# Patient Record
Sex: Male | Born: 1970 | Race: White | Hispanic: No | Marital: Married | Smoking: Former smoker
Health system: Southern US, Community
[De-identification: ages and names within clinical notes are randomized; demographics above are authoritative.]

## PROBLEM LIST (undated history)

## (undated) DIAGNOSIS — I471 Supraventricular tachycardia, unspecified: Secondary | ICD-10-CM

## (undated) DIAGNOSIS — Z8619 Personal history of other infectious and parasitic diseases: Secondary | ICD-10-CM

## (undated) DIAGNOSIS — R55 Syncope and collapse: Secondary | ICD-10-CM

## (undated) HISTORY — DX: Supraventricular tachycardia, unspecified: I47.10

## (undated) HISTORY — DX: Supraventricular tachycardia: I47.1

## (undated) HISTORY — PX: KNEE SURGERY: SHX244

## (undated) HISTORY — PX: FRACTURE SURGERY: SHX138

## (undated) HISTORY — DX: Personal history of other infectious and parasitic diseases: Z86.19

## (undated) HISTORY — PX: TONSILLECTOMY AND ADENOIDECTOMY: SHX28

## (undated) HISTORY — DX: Syncope and collapse: R55

---

## 2004-06-14 HISTORY — PX: OTHER SURGICAL HISTORY: SHX169

## 2007-04-22 DIAGNOSIS — M5020 Other cervical disc displacement, unspecified cervical region: Secondary | ICD-10-CM | POA: Insufficient documentation

## 2007-11-30 DIAGNOSIS — F39 Unspecified mood [affective] disorder: Secondary | ICD-10-CM | POA: Insufficient documentation

## 2009-08-29 ENCOUNTER — Emergency Department: Payer: Self-pay | Admitting: Emergency Medicine

## 2010-01-26 LAB — HEPATIC FUNCTION PANEL
ALT: 45 U/L — AB (ref 10–40)
AST: 25 U/L (ref 14–40)

## 2010-01-26 LAB — LIPID PANEL
CHOLESTEROL: 267 mg/dL — AB (ref 0–200)
HDL: 32 mg/dL — AB (ref 35–70)
LDL Cholesterol: 167 mg/dL
TRIGLYCERIDES: 342 mg/dL — AB (ref 40–160)

## 2010-01-26 LAB — BASIC METABOLIC PANEL
BUN: 9 mg/dL (ref 4–21)
CREATININE: 0.8 mg/dL (ref 0.6–1.3)
Glucose: 96 mg/dL
Potassium: 4.6 mmol/L (ref 3.4–5.3)
Sodium: 141 mmol/L (ref 137–147)

## 2010-01-26 LAB — TSH: TSH: 1.43 u[IU]/mL (ref 0.41–5.90)

## 2014-07-31 ENCOUNTER — Other Ambulatory Visit: Payer: Self-pay | Admitting: *Deleted

## 2014-07-31 ENCOUNTER — Other Ambulatory Visit: Payer: Self-pay | Admitting: Family Medicine

## 2014-07-31 MED ORDER — HYDROCODONE-ACETAMINOPHEN 5-325 MG PO TABS: 1.0000 | ORAL_TABLET | Freq: Three times a day (TID) | ORAL | Status: DC | PRN

## 2014-07-31 NOTE — Telephone Encounter (Signed)
Refill request for Hydrocodone-Ace 5-325 mg Last filled by MD on- 06/16/2014 #90 x0 Last Appt: 08/05/2013 Next Appt: none Please advise refill?

## 2014-07-31 NOTE — Telephone Encounter (Signed)
Pt contacted office for refill request on the following medications: Hydrocodone 5-325 mg Pt stated he is out of the medication and tried to send a refill request online yesterday. Thanks TNP

## 2014-08-03 NOTE — Telephone Encounter (Addendum)
Left detailed message on pt's vm, letting pt know his rx is ready to pick-up.

## 2014-08-03 NOTE — Telephone Encounter (Signed)
Pt is called to see if Rx is ready to pick up/MJ

## 2014-08-10 ENCOUNTER — Emergency Department: Payer: Self-pay

## 2014-08-10 ENCOUNTER — Emergency Department
Admission: EM | Admit: 2014-08-10 | Discharge: 2014-08-10 | Discharge status: Against Medical Advice | Payer: Self-pay | Attending: Emergency Medicine | Admitting: Emergency Medicine

## 2014-08-10 ENCOUNTER — Other Ambulatory Visit: Payer: Self-pay

## 2014-08-10 DIAGNOSIS — Z720 Tobacco use: Secondary | ICD-10-CM | POA: Insufficient documentation

## 2014-08-10 DIAGNOSIS — R55 Syncope and collapse: Secondary | ICD-10-CM | POA: Insufficient documentation

## 2014-08-10 DIAGNOSIS — R000 Tachycardia, unspecified: Secondary | ICD-10-CM | POA: Insufficient documentation

## 2014-08-10 LAB — CBC
HEMATOCRIT: 46.5 % (ref 40.0–52.0)
Hemoglobin: 15.8 g/dL (ref 13.0–18.0)
MCH: 29.6 pg (ref 26.0–34.0)
MCHC: 34 g/dL (ref 32.0–36.0)
MCV: 87.1 fL (ref 80.0–100.0)
PLATELETS: 234 10*3/uL (ref 150–440)
RBC: 5.34 MIL/uL (ref 4.40–5.90)
RDW: 13.9 % (ref 11.5–14.5)
WBC: 9.3 10*3/uL (ref 3.8–10.6)

## 2014-08-10 LAB — BASIC METABOLIC PANEL
Anion gap: 8 (ref 5–15)
BUN: 10 mg/dL (ref 6–20)
CHLORIDE: 105 mmol/L (ref 101–111)
CO2: 23 mmol/L (ref 22–32)
CREATININE: 0.92 mg/dL (ref 0.61–1.24)
Calcium: 8.9 mg/dL (ref 8.9–10.3)
GFR calc Af Amer: >60 mL/min (ref >60)
GFR calc non Af Amer: >60 mL/min (ref >60)
Glucose, Bld: 131 mg/dL — ABNORMAL HIGH (ref 65–99)
Potassium: 4.1 mmol/L (ref 3.5–5.1)
Sodium: 136 mmol/L (ref 135–145)

## 2014-08-10 LAB — TROPONIN I: Troponin I: <0.03 ng/mL (ref <0.031)

## 2014-08-10 LAB — MAGNESIUM: Magnesium: 2 mg/dL (ref 1.7–2.4)

## 2014-08-10 LAB — TSH: TSH: 1.256 u[IU]/mL (ref 0.350–4.500)

## 2014-08-10 MED ORDER — SODIUM CHLORIDE 0.9 % IV BOLUS (SEPSIS)
1000.0000 mL | Freq: Once | INTRAVENOUS | Status: AC
  Administered 2014-08-10: 1000 mL via INTRAVENOUS

## 2014-08-10 MED ORDER — DILTIAZEM HCL 25 MG/5ML IV SOLN
INTRAVENOUS | Status: AC
  Filled 2014-08-10: qty 5

## 2014-08-10 NOTE — ED Notes (Signed)
Pt arrives with syncopal episode, pt states he passed out in the bathroom this morning, pt denies dizziness, pt states he felt hot and sweaty before event, upon arrival pt HR 120s ST, MD at bedside, code cart at bedside, EKG repeated from triage, wife at bedside, pt awake and alert in no distress, no increased work of breathing

## 2014-08-10 NOTE — Discharge Instructions (Signed)
Syncope  No driving until cleared by a physician. Follow-up with Dr. Velva Harman as soon as possible. You may return to the emergency room at any time for reevaluation and consideration to be admitted as your offered today.  Syncope is a medical term for fainting or passing out. This means you lose consciousness and drop to the ground. People are generally unconscious for less than 5 minutes. You may have some muscle twitches for up to 15 seconds before waking up and returning to normal. Syncope occurs more often in older adults, but it can happen to anyone. While most causes of syncope are not dangerous, syncope can be a sign of a serious medical problem. It is important to seek medical care.  CAUSES  Syncope is caused by a sudden drop in blood flow to the brain. The specific cause is often not determined. Factors that can bring on syncope include:  Taking medicines that lower blood pressure.  Sudden changes in posture, such as standing up quickly.  Taking more medicine than prescribed.  Standing in one place for too long.  Seizure disorders.  Dehydration and excessive exposure to heat.  Low blood sugar (hypoglycemia).  Straining to have a bowel movement.  Heart disease, irregular heartbeat, or other circulatory problems.  Fear, emotional distress, seeing blood, or severe pain. SYMPTOMS  Right before fainting, you may:  Feel dizzy or light-headed.  Feel nauseous.  See all white or all black in your field of vision.  Have cold, clammy skin. DIAGNOSIS  Your health care provider will ask about your symptoms, perform a physical exam, and perform an electrocardiogram (ECG) to record the electrical activity of your heart. Your health care provider may also perform other heart or blood tests to determine the cause of your syncope which may include:  Transthoracic echocardiogram (TTE). During echocardiography, sound waves are used to evaluate how blood flows through your  heart.  Transesophageal echocardiogram (TEE).  Cardiac monitoring. This allows your health care provider to monitor your heart rate and rhythm in real time.  Holter monitor. This is a portable device that records your heartbeat and can help diagnose heart arrhythmias. It allows your health care provider to track your heart activity for several days, if needed.  Stress tests by exercise or by giving medicine that makes the heart beat faster. TREATMENT  In most cases, no treatment is needed. Depending on the cause of your syncope, your health care provider may recommend changing or stopping some of your medicines. HOME CARE INSTRUCTIONS  Have someone stay with you until you feel stable.  Do not drive, use machinery, or play sports until your health care provider says it is okay.  Keep all follow-up appointments as directed by your health care provider.  Lie down right away if you start feeling like you might faint. Breathe deeply and steadily. Wait until all the symptoms have passed.  Drink enough fluids to keep your urine clear or pale yellow.  If you are taking blood pressure or heart medicine, get up slowly and take several minutes to sit and then stand. This can reduce dizziness. SEEK IMMEDIATE MEDICAL CARE IF:   You have a severe headache.  You have unusual pain in the chest, abdomen, or back.  You are bleeding from your mouth or rectum, or you have black or tarry stool.  You have an irregular or very fast heartbeat.  You have pain with breathing.  You have repeated fainting or seizure-like jerking during an episode.  You faint  when sitting or lying down.  You have confusion.  You have trouble walking.  You have severe weakness.  You have vision problems. If you fainted, call your local emergency services (911 in [place] Do not drive yourself to the hospital.  MAKE SURE YOU:  Understand these instructions.  Will watch your condition.  Will get help right away  if you are not doing well or get worse. Document Released: 01/09/2005 Document Revised: 01/14/2013 Document Reviewed: 03/10/2011 [facility] Patient Information 2015 [place], [place]. This information is not intended to replace advice given to you by your health care provider. Make sure you discuss any questions you have with your health care provider.

## 2014-08-10 NOTE — ED Provider Notes (Signed)
[facility] Emergency Department Provider Note  ____________________________________________  Time seen: Approximately 9:14 AM  I have reviewed the triage vital signs and the nursing notes.   HISTORY  Chief Complaint Loss of Consciousness    HPI Devin Parsons is a 44 y.o. male patient states that he was walking from the bathroom when he suddenly felt his heart was racing and passed out. His significant other who is with him states that he passed out for only a couple of seconds and then she was able to arouse him. He was awake and oriented. He denies any chest pain or trouble breathing. He has not had any caffeine today, though he does usually drink a monster drink and coffee every day but none recently. He denies any swelling. No long trips or travels. Denies having any history of any heart problems. Does have a previous history of drug abuse but this was greater than 20 years ago.  Patient states that he felt his heart was racing really fast and is very lightheaded popping him to come to the emergency room. I saw him in the hospital room and approximately 9 AM at which time his heart rate has improved, and he states his symptoms are feeling better.  Again, patient denies any chest pain or trouble breathing. No abdominal pain. No fevers or chills.   History reviewed. No pertinent past medical history.  There are no active problems to display for this patient.   Past Surgical History  Procedure Laterality Date  . Fracture surgery      Current Outpatient Rx  Name  Route  Sig  Dispense  Refill  . ibuprofen (ADVIL,MOTRIN) 200 MG tablet   Oral   Take 200 mg by mouth 2 (two) times daily as needed for mild pain.         . nabumetone (RELAFEN) 500 MG tablet   Oral   Take 500 mg by mouth daily as needed for moderate pain.            Allergies Review of patient's allergies indicates no known allergies.  No family history on file.  Social  History History  Substance Use Topics  . Smoking status: Current Every Day Smoker  . Smokeless tobacco: Not on file  . Alcohol Use: Yes    Review of Systems Constitutional: No fever/chills Eyes: No visual changes. ENT: No sore throat. Cardiovascular: Denies chest pain. Respiratory: Denies shortness of breath. Gastrointestinal: No abdominal pain.  Did feel some nausea just prior to and after passing out.  No diarrhea.  No constipation. Genitourinary: Negative for dysuria. Musculoskeletal: Negative for back pain. Skin: Negative for rash. Neurological: Negative for headaches, focal weakness or numbness.  10-point ROS otherwise negative.  ____________________________________________   PHYSICAL EXAM:  VITAL SIGNS: ED Triage Vitals  Enc Vitals Group     BP 08/10/14 0853 74/53 mmHg     Pulse Rate 08/10/14 0853 196     Resp 08/10/14 0853 18     Temp 08/10/14 0853 98.1 F (36.7 C)     Temp Source 08/10/14 0853 Oral     SpO2 08/10/14 0853 98 %     Weight 08/10/14 0853 160 lb (72.576 kg)     Height 08/10/14 0853 5\' 11"  (1.803 m)     Head Cir --      Peak Flow --      Pain Score --      Pain Loc --      Pain Edu? --  Excl. in [place]? --     Constitutional: Alert and oriented. Well appearing and in no acute distress. Eyes: Conjunctivae are normal. PERRL. EOMI. Head: Atraumatic. Nose: No congestion/rhinnorhea. Mouth/Throat: Mucous membranes are moist.  Oropharynx non-erythematous. Neck: No stridor.   Cardiovascular: 120 heart rate, regular rhythm. Grossly normal heart sounds.  Good peripheral circulation. Respiratory: Normal respiratory effort.  No retractions. Lungs CTAB. Gastrointestinal: Soft and nontender. No distention. No abdominal bruits. No CVA tenderness. Musculoskeletal: No lower extremity tenderness nor edema.  No joint effusions. No edema. No cords. No calf tenderness. Neurologic:  Normal speech and language. No gross focal neurologic deficits are appreciated.  No gait instability. Skin:  Skin is warm, dry and intact. No rash noted. Psychiatric: Mood and affect are normal. Speech and behavior are normal.  ____________________________________________   LABS (all labs ordered are listed, but only abnormal results are displayed)  Labs Reviewed  BASIC METABOLIC PANEL - Abnormal; Notable for the following:    Glucose, Bld 131 (*)    All other components within normal limits  CBC  MAGNESIUM  TROPONIN I  TSH  URINE DRUG SCREEN, QUALITATIVE ([facility] ONLY)   ____________________________________________  EKG  Interpreted by me Time 9 AM Tachycardia to 200. Undetermined rhythm, though suspect atrial fibrillation versus flutter as there is some irregularity. No acute ST elevation, possibly some rate related changes noted throughout suggesting subendocardial injury. QRS 70 QTc 431  Repeat EKG reviewed and interpreted by me at 9:02 AM Heart rate 107 Sinus tachycardia without ischemic abnormality. Intervals normal. Interpreted as sinus tachycardia otherwise normal EKG. ____________________________________________  RADIOLOGY  DG Chest 1 View (Final result) Result time: 08/10/14 27:03:50   Final result by Rad Results In Interface (08/10/14 09:38:18)   Narrative:   CLINICAL DATA: Syncope.  EXAM: CHEST 1 VIEW  COMPARISON: None.  FINDINGS: Mediastinum and hilar structures are normal. Heart size normal. No pleural effusion or pneumothorax. No acute bony abnormality.  IMPRESSION: No acute cardiopulmonary disease    ____________________________________________   PROCEDURES  Procedure(s) performed: None  Critical Care performed: Yes, see critical care note(s)  CRITICAL CARE Performed by: Delman Kitten   Total critical care time: 35  Critical care time was exclusive of separately billable procedures and treating other patients.  Critical care was necessary to treat or prevent imminent or life-threatening  deterioration.  Critical care was time spent personally by me on the following activities: development of treatment plan with patient and/or surrogate as well as nursing, discussions with consultants, evaluation of patient's response to treatment, examination of patient, obtaining history from patient or surrogate, ordering and performing treatments and interventions, ordering and review of laboratory studies, ordering and review of radiographic studies, pulse oximetry and re-evaluation of patient's condition.  Patient presents with unstable vital signs after syncopal episode and hypotension with initial blood pressure less than 80 systolic and heart rate approaching 200 prompting immediate need for evaluation to rule out life-threatening illness including acute cardiac.  ____________________________________________   INITIAL IMPRESSION / ASSESSMENT AND PLAN / ED COURSE  Pertinent labs & imaging results that were available during my care of the patient were reviewed by me and considered in my medical decision making (see chart for details).  Patient presents with extreme tachycardia and hypotension. Initial EKG is concerning for possible atrial fibrillation versus flutter suggesting a malignant arrhythmia that may have caused him to have a syncopal episode. No evidence of injury. Normal neurologic examination, and no indication for CT imaging of the head. I am concerned about  a primary cardiac etiology, he has no risk factor for pulmonary embolism and no chest pain suggest acute cardiac ischemia. We'll send laboratory evaluation, hydrate, and continue to monitor him closely. Anticipate discussion with cardiology.  I am reassured at this time that his heart rate has improved and his blood pressure is improving. ____________________________________________  ----------------------------------------- 11:12 AM on 08/10/2014 -----------------------------------------  No ectopy on telemetry  monitoring. Patient denies any symptoms. I discussed with Dr. Velva Harman EKG, presentation including syncope and he recommends admission for echocardiogram and cardiology consultation. Discussed with the patient, he is currently debating whether or not he would like to be admitted otherwise would have to go AMA as I explained the risk of death due to severe arrhythmia is quite real. The patient voices understanding that he is at risk for potential arrhythmia, cardiac arrest, or death if he were to leave without further observation and cardiology consultation. He and his wife continues to debate whether or not he would like to stay.  ----------------------------------------- 11:52 AM on 08/10/2014 -----------------------------------------  08/10/2014 at 11:53 AM:  The patient requested to leave.  I considered this to be leaving against medical advice. I personally discussed the following with them:  1)  That they currently had a medical condition of passing out and rapid heart rate a In arrhythmia which could put him at risk of cardiac arrest or death. nd another episode which could put him at risk of death or severe disability.I am concerned that they may have   2)  My proposed course of evaluation and treatment includes, but is not limited to, admission and cardiology consult .  Benefits of staying include possible diagnosis or excluding of heart attack or life threatning heart problem or an alternative serious condition such as atrial fibrillation or flutter, which if identified early would lead to appropriate intervention in a timely manner lessening the burden of disability and death.  Risks of leaving before this had been completed include: misdiagnosis, worsening illness leading up to and including prolonged or permanent disability or death.  Specific risks pertinent, but not all inclusive, of their current medical condition include but are not limited to death, cardiac arrest, permanent disability,  stroke.    Despite this he refused further evaluation, treatment, or admission at this time.   They appeared clinically sober, were mentating appropriately, were free from distracting injury, had adequately controlled acute pain, appeared to have intact insight, judgment, and reason, and in my opinion had the capacity to make this decision.  Specifically, they were able to verbally state back in a coherent manner their current medical condition/current diagnosis, the proposed course of evaluation and/or treatment, and the risks, benefits, and alternatives of treatment versus leaving against medical advice.   They understand that they may return to seek medical attention here at ANY time they want.  I strongly advised them to return to the Emergency Department immediately if they experience any new or worsening symptoms that concern them, or simply if they reconsider continued evaluation and/or treatment as previously discussed.  This would be without any repercussions, though they understand they likely will need to wait again in the Emergency Department if other patients are in front of them, rather than being brought straight back.  They understood this is another advantage of staying, but still insisted upon leaving.  I recommended they follow-up with Dr. Fletcher Anon at the earliest available opportunity/appointment for further evaluation and treatment.   The patient was discharged against medical advice.  They did accept  written discharge instructions. Patient will be discharged [place] having strongly encouraged him to stay in discussing the risks with him and his wife.  I discussed with Dr. Sophronia Simas he will follow-up in the clinic as soon as possible.   FINAL CLINICAL IMPRESSION(S) / ED DIAGNOSES  Final diagnoses:  Tachyarrhythmia  Syncope and collapse      Delman Kitten, MD 08/10/14 1159

## 2014-08-10 NOTE — ED Notes (Signed)
Pt states he has not felt well for the past couple of days and was walking back from the BR and passed out, denies chest pain but states he has felt his heart beating fast , states "thats normal for me".Devin Parsons

## 2014-08-10 NOTE — ED Notes (Signed)
AAOx3.  Skin warm and dry.  No SOB/DOE.  Denies complaint of pain.  Patient demonstrates good understanding of signing out AMA.  Reinforced importance to return should symptoms return.  Understanding verbalized.  Ambulates with easy and steady gait.  NAD.

## 2014-08-10 NOTE — ED Notes (Signed)
Pt resting in bed in no distress, wife at bedside, will continue to monitor closely

## 2014-08-11 ENCOUNTER — Telehealth: Payer: Self-pay

## 2014-08-11 NOTE — Telephone Encounter (Signed)
l mom to schedule ED f/u appt for syncope

## 2014-08-13 ENCOUNTER — Encounter: Payer: Self-pay | Admitting: Emergency Medicine

## 2014-08-13 ENCOUNTER — Emergency Department: Payer: Medicaid Other

## 2014-08-13 ENCOUNTER — Observation Stay: Admit: 2014-08-13 | Payer: Medicaid Other

## 2014-08-13 ENCOUNTER — Observation Stay (HOSPITAL_BASED_OUTPATIENT_CLINIC_OR_DEPARTMENT_OTHER)
Admit: 2014-08-13 | Discharge: 2014-08-13 | Disposition: A | Discharge status: Home / Self Care | Payer: Medicaid Other | Attending: Internal Medicine | Admitting: Internal Medicine

## 2014-08-13 ENCOUNTER — Observation Stay
Admission: EM | Admit: 2014-08-13 | Discharge: 2014-08-14 | Disposition: A | Discharge status: Home / Self Care | Payer: Medicaid Other | Attending: Internal Medicine | Admitting: Internal Medicine

## 2014-08-13 DIAGNOSIS — I071 Rheumatic tricuspid insufficiency: Secondary | ICD-10-CM | POA: Diagnosis not present

## 2014-08-13 DIAGNOSIS — R002 Palpitations: Secondary | ICD-10-CM | POA: Insufficient documentation

## 2014-08-13 DIAGNOSIS — Z7982 Long term (current) use of aspirin: Secondary | ICD-10-CM | POA: Insufficient documentation

## 2014-08-13 DIAGNOSIS — M1990 Unspecified osteoarthritis, unspecified site: Secondary | ICD-10-CM | POA: Insufficient documentation

## 2014-08-13 DIAGNOSIS — R55 Syncope and collapse: Secondary | ICD-10-CM

## 2014-08-13 DIAGNOSIS — Z79899 Other long term (current) drug therapy: Secondary | ICD-10-CM | POA: Insufficient documentation

## 2014-08-13 DIAGNOSIS — R5383 Other fatigue: Secondary | ICD-10-CM | POA: Diagnosis present

## 2014-08-13 DIAGNOSIS — I471 Supraventricular tachycardia: Secondary | ICD-10-CM | POA: Diagnosis not present

## 2014-08-13 DIAGNOSIS — R42 Dizziness and giddiness: Secondary | ICD-10-CM | POA: Insufficient documentation

## 2014-08-13 DIAGNOSIS — F17200 Nicotine dependence, unspecified, uncomplicated: Secondary | ICD-10-CM | POA: Diagnosis not present

## 2014-08-13 DIAGNOSIS — E860 Dehydration: Secondary | ICD-10-CM | POA: Diagnosis not present

## 2014-08-13 DIAGNOSIS — Z833 Family history of diabetes mellitus: Secondary | ICD-10-CM | POA: Diagnosis not present

## 2014-08-13 DIAGNOSIS — R0602 Shortness of breath: Secondary | ICD-10-CM | POA: Insufficient documentation

## 2014-08-13 LAB — BASIC METABOLIC PANEL
Anion gap: 8 (ref 5–15)
BUN: 9 mg/dL (ref 6–20)
CALCIUM: 9.1 mg/dL (ref 8.9–10.3)
CHLORIDE: 104 mmol/L (ref 101–111)
CO2: 27 mmol/L (ref 22–32)
Creatinine, Ser: 0.72 mg/dL (ref 0.61–1.24)
GFR calc Af Amer: >60 mL/min (ref >60)
GFR calc non Af Amer: >60 mL/min (ref >60)
Glucose, Bld: 115 mg/dL — ABNORMAL HIGH (ref 65–99)
Potassium: 3.7 mmol/L (ref 3.5–5.1)
SODIUM: 139 mmol/L (ref 135–145)

## 2014-08-13 LAB — CBC
HCT: 42.5 % (ref 40.0–52.0)
Hemoglobin: 14 g/dL (ref 13.0–18.0)
MCH: 28.8 pg (ref 26.0–34.0)
MCHC: 33 g/dL (ref 32.0–36.0)
MCV: 87.1 fL (ref 80.0–100.0)
Platelets: 246 10*3/uL (ref 150–440)
RBC: 4.87 MIL/uL (ref 4.40–5.90)
RDW: 13.8 % (ref 11.5–14.5)
WBC: 8.1 10*3/uL (ref 3.8–10.6)

## 2014-08-13 LAB — MAGNESIUM: MAGNESIUM: 2.1 mg/dL (ref 1.7–2.4)

## 2014-08-13 LAB — TROPONIN I: Troponin I: <0.03 ng/mL (ref <0.031)

## 2014-08-13 MED ORDER — ACETAMINOPHEN 650 MG RE SUPP: 650.0000 mg | Freq: Four times a day (QID) | RECTAL | Status: DC | PRN

## 2014-08-13 MED ORDER — ASPIRIN EC 325 MG PO TBEC
325.0000 mg | DELAYED_RELEASE_TABLET | Freq: Every day | ORAL | Status: DC
  Administered 2014-08-14: 325 mg via ORAL
  Filled 2014-08-13: qty 1

## 2014-08-13 MED ORDER — SENNOSIDES-DOCUSATE SODIUM 8.6-50 MG PO TABS: 1.0000 | ORAL_TABLET | Freq: Every evening | ORAL | Status: DC | PRN

## 2014-08-13 MED ORDER — ENOXAPARIN SODIUM 40 MG/0.4ML ~~LOC~~ SOLN
40.0000 mg | SUBCUTANEOUS | Status: DC
  Administered 2014-08-13: 40 mg via SUBCUTANEOUS
  Filled 2014-08-13: qty 0.4

## 2014-08-13 MED ORDER — ONDANSETRON HCL 4 MG/2ML IJ SOLN: 4.0000 mg | Freq: Four times a day (QID) | INTRAMUSCULAR | Status: DC | PRN

## 2014-08-13 MED ORDER — ONDANSETRON HCL 4 MG PO TABS: 4.0000 mg | ORAL_TABLET | Freq: Four times a day (QID) | ORAL | Status: DC | PRN

## 2014-08-13 MED ORDER — ALUM & MAG HYDROXIDE-SIMETH 200-200-20 MG/5ML PO SUSP: 30.0000 mL | Freq: Four times a day (QID) | ORAL | Status: DC | PRN

## 2014-08-13 MED ORDER — NABUMETONE 500 MG PO TABS
500.0000 mg | ORAL_TABLET | Freq: Every day | ORAL | Status: DC | PRN
  Filled 2014-08-13: qty 1

## 2014-08-13 MED ORDER — FAMOTIDINE 20 MG PO TABS
20.0000 mg | ORAL_TABLET | Freq: Every day | ORAL | Status: DC
  Administered 2014-08-13 – 2014-08-14 (×2): 20 mg via ORAL
  Filled 2014-08-13 (×2): qty 1

## 2014-08-13 MED ORDER — ACETAMINOPHEN 325 MG PO TABS: 650.0000 mg | ORAL_TABLET | Freq: Four times a day (QID) | ORAL | Status: DC | PRN

## 2014-08-13 NOTE — H&P (Signed)
[facility] [place] NAME: Devin Parsons    MR#:  [id number]  DATE OF BIRTH:  1970-05-19  DATE OF ADMISSION:  08/13/2014  PRIMARY CARE PHYSICIAN: Lelon Huh, MD   REQUESTING/REFERRING PHYSICIAN: Dr. Karma Greaser  CHIEF COMPLAINT:  Fatigue and dizziness  HISTORY OF PRESENT ILLNESS:  Devin Parsons  is a 44 y.o. male with a known history of tobacco abuse came into the ED after he had an episode of near syncope today. Patient presented to the ED approximately 3 days ago on July 18 after an episode of syncope and palpitations that appear to be cardiogenic.At the time that he arrived he had severe tachycardia and it seemed irregular. He was seen by Dr. Jacqualine Code and Dr. Jacqualine Code spoke with Dr. Fletcher Anon. Their plan was to admit the patient for an echocardiogram and cardiology evaluation. However the patient felt strongly about not staying in the hospital so he left [place]. Following that for the past 3 days he was feeling very fatigued and tired but did not have any episodes of chest pain or shortness of breath. Intermittent transient episodes of palpitations which were resolving spontaneously. Today patient felt dizzy and almost passed out but did not syncopized completely and came into the ED for further evaluation. He has been using over-the-counter monster energy drink. He was using over-the-counter fat burning pills until 2 weeks ago  PAST MEDICAL HISTORY:  History reviewed. No pertinent past medical history.  PAST SURGICAL HISTOIRY:   Past Surgical History  Procedure Laterality Date  . Fracture surgery      SOCIAL HISTORY:   History  Substance Use Topics  . Smoking status: Current Every Day Smoker  . Smokeless tobacco: Not on file  . Alcohol Use: Yes   drinks 3 cups of coffee per day Denies any illicit drugs  FAMILY HISTORY:  Mother has history of diabetic mellitus  DRUG ALLERGIES:  No Known Allergies  REVIEW OF  SYSTEMS:  CONSTITUTIONAL: No fever, but reporting fatigue and weakness.  EYES: No blurred or double vision.  EARS, NOSE, AND THROAT: No tinnitus or ear pain.  RESPIRATORY: No cough, shortness of breath, wheezing or hemoptysis.  CARDIOVASCULAR: No chest pain, orthopnea, edema.  GASTROINTESTINAL: No nausea, vomiting, diarrhea or abdominal pain.  GENITOURINARY: No dysuria, hematuria.  ENDOCRINE: No polyuria, nocturia,  HEMATOLOGY: No anemia, easy bruising or bleeding SKIN: No rash or lesion. MUSCULOSKELETAL: No joint pain or arthritis.   NEUROLOGIC: No tingling, numbness, weakness.  PSYCHIATRY: No anxiety or depression.   MEDICATIONS AT HOME:   Prior to Admission medications   Medication Sig Start Date End Date Taking? Authorizing Provider  ibuprofen (ADVIL,MOTRIN) 200 MG tablet Take 400-600 mg by mouth 2 (two) times daily as needed for headache or mild pain.    Yes Historical Provider, MD  nabumetone (RELAFEN) 500 MG tablet Take 500 mg by mouth daily as needed for mild pain.   Yes Historical Provider, MD      VITAL SIGNS:  Blood pressure 106/81, pulse 76, temperature 98 F (36.7 C), temperature source Oral, resp. rate 17, height 5\' 11"  (1.803 m), weight 81.647 kg (180 lb), SpO2 98 %.  PHYSICAL EXAMINATION:  GENERAL:  44 y.o.-year-old patient lying in the bed with no acute distress.  EYES: Pupils equal, round, reactive to light and accommodation. No scleral icterus. Extraocular muscles intact.  HEENT: Head atraumatic, normocephalic. Oropharynx and nasopharynx clear.  NECK:  Supple, no jugular venous distention. No thyroid enlargement, no  tenderness.  LUNGS: Normal breath sounds bilaterally, no wheezing, rales,rhonchi or crepitation. No use of accessory muscles of respiration.  CARDIOVASCULAR: S1, S2 normal. No murmurs, rubs, or gallops.  ABDOMEN: Soft, nontender, nondistended. Bowel sounds present. No organomegaly or mass.  EXTREMITIES: No pedal edema, cyanosis, or clubbing.   NEUROLOGIC: Cranial nerves II through XII are intact. Muscle strength 5/5 in all extremities. Sensation intact. Gait not checked.  PSYCHIATRIC: The patient is alert and oriented x 3.  SKIN: No obvious rash, lesion, or ulcer. Several tattoos on the extremities  LABORATORY PANEL:   CBC  Recent Labs Lab 08/13/14 1254  WBC 8.1  HGB 14.0  HCT 42.5  PLT 246   ------------------------------------------------------------------------------------------------------------------  Chemistries   Recent Labs Lab 08/13/14 1254  NA 139  K 3.7  CL 104  CO2 27  GLUCOSE 115*  BUN 9  CREATININE 0.72  CALCIUM 9.1  MG 2.1   ------------------------------------------------------------------------------------------------------------------  Cardiac Enzymes  Recent Labs Lab 08/13/14 1254  TROPONINI <0.03   ------------------------------------------------------------------------------------------------------------------  RADIOLOGY:  Dg Chest 2 View  08/13/2014   CLINICAL DATA:  Fatigue and dizziness. Intermittent shortness of breath  EXAM: CHEST  2 VIEW  COMPARISON:  July 31, 2014  FINDINGS: The lungs are clear. The heart size and pulmonary vascularity are normal. No adenopathy. No bone lesions.  IMPRESSION: No edema or consolidation.   Electronically Signed   By: Lowella Grip III M.D.   On: 08/13/2014 13:35    EKG:   Orders placed or performed during the hospital encounter of 08/13/14  . ED EKG within 10 minutes  . ED EKG within 10 minutes    IMPRESSION AND PLAN:   Devin Parsons  is a 44 y.o. male with a known history of tobacco abuse came into the ED after he had an episode of near syncope today.   1. Near syncope with history of syncope 3 days ago Admit him to telemetry under observation status Monitored on telemetry Cycle cardiac biomarkers Obtain echocardiogram Check orthostatic vital signs Check TSH and fasting lipid panel in a.m. Cardiology consult with dr.Arida   Patient will be benefited with outpatient event monitor for 30-60 days if no diagnosis is made during this hospital course Recommended to stop using over-the-counter fat burning pills and energy drinks  2. Osteoarthritis Continue his home medication and provide pain management as needed  3. Tobacco abuse Counseled patient to quit smoking for 4-5 minutes. We will provide nicotine patch and patient is considering to quit smoking  GI prophylaxis with Pepcid DVT prophylaxis with Lovenox subcutaneous   All the records are reviewed and case discussed with ED provider. Management plans discussed with the patient, family and they are in agreement.  CODE STATUS: Full code,wife is the HCPOA  TOTAL TIME TAKING CARE OF THIS PATIENT, reviewing the medical records, labs, obtaining history and physical, admission orders and coordination of care -45 minutes.    Nicholes Mango M.D on 08/13/2014 at 5:56 PM  Between 7am to 6pm - Pager - [phone]  After 6pm go to [email or web address] - password [email or web address] [facility]  Office  [phone]  CC: Primary care physician; Lelon Huh, MD

## 2014-08-13 NOTE — ED Notes (Signed)
Pt reports feelings of fatigue since Monday. Was seen here then for syncopal episode and dx with irregular heart beat. Pt states his heart rate was over 200 on Monday and low blood pressure. Pt with acute distress noted in triage. Pt states he felt like he was going to pass out at work this am.

## 2014-08-13 NOTE — ED Notes (Signed)
Pt states episode of cold sweats, hot flash and weakness, pt was seen in ER Monday for Palpatations and left AMA, pt awake and alert during assessment in no distress

## 2014-08-13 NOTE — Progress Notes (Signed)
*  PRELIMINARY RESULTS* Echocardiogram 2D Echocardiogram has been performed.  [name] 08/13/2014, 8:11 PM

## 2014-08-13 NOTE — ED Provider Notes (Signed)
[facility] Emergency Department Provider Note  ____________________________________________  Time seen: Approximately 4:43 PM  I have reviewed the triage vital signs and the nursing notes.   HISTORY  Chief Complaint Fatigue    HPI Devin Parsons is a 44 y.o. male with a history of tobacco abuse presents after an episode of near-syncope today.However, he presented 3 days ago after an episode of full syncope and palpitations that appear to be cardiogenic.  At the time that he arrived he had severe tachycardia and it seemed irregular.  He was seen by Dr. Jacqualine Code and Dr. Jacqualine Code spoke with Dr. Fletcher Anon.  Their plan was to admit the patient for an echocardiogram and cardiology evaluation.  However the patient felt strongly about not staying in the hospital so he left [place].  In the intervening 3 days, he has felt extremely exhausted and fatigued but has not had chest pain or shortness of breath.  He occasionally has some transient feeling of palpitation but they do not last.  However, today he felt the same thing felt on Monday and nearly passed out although it resolved before he syncopized.  He is concerned about the symptoms and return to the emergency department as he was instructed when he left on Monday.  He describes the symptoms as severe.  He denies fever/chills, abdominal pain.   History reviewed. No pertinent past medical history.  There are no active problems to display for this patient.   Past Surgical History  Procedure Laterality Date  . Fracture surgery      Current Outpatient Rx  Name  Route  Sig  Dispense  Refill  . ibuprofen (ADVIL,MOTRIN) 200 MG tablet   Oral   Take 400-600 mg by mouth 2 (two) times daily as needed for headache or mild pain.          . nabumetone (RELAFEN) 500 MG tablet   Oral   Take 500 mg by mouth daily as needed for mild pain.           Allergies Review of patient's allergies indicates no known  allergies.  No family history on file.  Social History History  Substance Use Topics  . Smoking status: Current Every Day Smoker  . Smokeless tobacco: Not on file  . Alcohol Use: Yes    Review of Systems Constitutional: No fever/chills Eyes: No visual changes. ENT: No sore throat. Cardiovascular: Denies chest pain.  Occasional episodes of very rapid and irregular palpitations. Respiratory: Denies shortness of breath. Gastrointestinal: No abdominal pain.  No nausea, no vomiting.  No diarrhea.  No constipation. Genitourinary: Negative for dysuria. Musculoskeletal: Negative for back pain. Skin: Negative for rash. Neurological: Negative for headaches, focal weakness or numbness.  10-point ROS otherwise negative.  ____________________________________________   PHYSICAL EXAM:  VITAL SIGNS: ED Triage Vitals  Enc Vitals Group     BP 08/13/14 1251 102/73 mmHg     Pulse Rate 08/13/14 1251 85     Resp 08/13/14 1251 18     Temp 08/13/14 1251 98 F (36.7 C)     Temp Source 08/13/14 1251 Oral     SpO2 08/13/14 1251 87 %     Weight 08/13/14 1251 180 lb (81.647 kg)     Height 08/13/14 1251 5\' 11"  (1.803 m)     Head Cir --      Peak Flow --      Pain Score --      Pain Loc --  Pain Edu? --      Excl. in [place]? --     Constitutional: Alert and oriented. Well appearing and in no acute distress. Eyes: Conjunctivae are normal. PERRL. EOMI. Head: Atraumatic. Nose: No congestion/rhinnorhea. Mouth/Throat: Mucous membranes are moist.  Oropharynx non-erythematous. Neck: No stridor.   Cardiovascular: Normal rate, regular rhythm. Grossly normal heart sounds.  Good peripheral circulation. Respiratory: Normal respiratory effort.  No retractions. Lungs CTAB. Gastrointestinal: Soft and nontender. No distention. No abdominal bruits. No CVA tenderness. Musculoskeletal: No lower extremity tenderness nor edema.  No joint effusions. Neurologic:  Normal speech and language. No gross focal  neurologic deficits are appreciated.  Skin:  Skin is warm, dry and intact. No rash noted. Psychiatric: Mood and affect are normal. Speech and behavior are normal.  ____________________________________________   LABS (all labs ordered are listed, but only abnormal results are displayed)  Labs Reviewed  BASIC METABOLIC PANEL - Abnormal; Notable for the following:    Glucose, Bld 115 (*)    All other components within normal limits  CBC  TROPONIN I  MAGNESIUM   ____________________________________________  EKG  ED ECG REPORT I, Payton Moder, the attending physician, personally viewed and interpreted this ECG.  Date: 08/13/2014 EKG Time: 13:02 Rate: 82 Rhythm: normal sinus rhythm QRS Axis: normal Intervals: normal ST/T Wave abnormalities: normal Conduction Disutrbances: none Narrative Interpretation: unremarkable  ____________________________________________  RADIOLOGY  I, Shamicka Inga, personally viewed and evaluated these images as part of my medical decision making.    Dg Chest 2 View  08/13/2014   CLINICAL DATA:  Fatigue and dizziness. Intermittent shortness of breath  EXAM: CHEST  2 VIEW  COMPARISON:  July 31, 2014  FINDINGS: The lungs are clear. The heart size and pulmonary vascularity are normal. No adenopathy. No bone lesions.  IMPRESSION: No edema or consolidation.   Electronically Signed   By: Lowella Grip III M.D.   On: 08/13/2014 13:35    ____________________________________________   PROCEDURES  Procedure(s) performed: None  Critical Care performed: No ____________________________________________   INITIAL IMPRESSION / ASSESSMENT AND PLAN / ED COURSE  Pertinent labs & imaging results that were available during my care of the patient were reviewed by me and considered in my medical decision making (see chart for details).  Given the patient's recent visit and recommendation by cardiology for admission, I will contact the hospitalist and  explaining the situation and requested observation and cardiology consultation and echocardiogram.  He is stable at this time  ____________________________________________  FINAL CLINICAL IMPRESSION(S) / ED DIAGNOSES  Final diagnoses:  Near syncope  Other fatigue      NEW MEDICATIONS STARTED DURING THIS VISIT:  New Prescriptions   No medications on file     Hinda Kehr, MD 08/13/14 1715

## 2014-08-14 ENCOUNTER — Telehealth: Payer: Self-pay

## 2014-08-14 ENCOUNTER — Encounter: Payer: Self-pay | Admitting: Physician Assistant

## 2014-08-14 LAB — CBC
HEMATOCRIT: 40.9 % (ref 40.0–52.0)
HEMOGLOBIN: 13.9 g/dL (ref 13.0–18.0)
MCH: 29.8 pg (ref 26.0–34.0)
MCHC: 34 g/dL (ref 32.0–36.0)
MCV: 87.6 fL (ref 80.0–100.0)
Platelets: 241 10*3/uL (ref 150–440)
RBC: 4.66 MIL/uL (ref 4.40–5.90)
RDW: 14.2 % (ref 11.5–14.5)
WBC: 8.1 10*3/uL (ref 3.8–10.6)

## 2014-08-14 LAB — LIPID PANEL
CHOLESTEROL: 158 mg/dL (ref 0–200)
HDL: 22 mg/dL — ABNORMAL LOW (ref >40)
LDL Cholesterol: 110 mg/dL — ABNORMAL HIGH (ref 0–99)
TRIGLYCERIDES: 132 mg/dL (ref <150)
Total CHOL/HDL Ratio: 7.2 RATIO
VLDL: 26 mg/dL (ref 0–40)

## 2014-08-14 LAB — COMPREHENSIVE METABOLIC PANEL
ALT: 56 U/L (ref 17–63)
ANION GAP: 8 (ref 5–15)
AST: 25 U/L (ref 15–41)
Albumin: 3.6 g/dL (ref 3.5–5.0)
Alkaline Phosphatase: 80 U/L (ref 38–126)
BUN: 10 mg/dL (ref 6–20)
CO2: 27 mmol/L (ref 22–32)
Calcium: 9.1 mg/dL (ref 8.9–10.3)
Chloride: 106 mmol/L (ref 101–111)
Creatinine, Ser: 0.74 mg/dL (ref 0.61–1.24)
GFR calc Af Amer: >60 mL/min (ref >60)
GLUCOSE: 106 mg/dL — AB (ref 65–99)
Potassium: 4.1 mmol/L (ref 3.5–5.1)
Sodium: 141 mmol/L (ref 135–145)
TOTAL PROTEIN: 7.1 g/dL (ref 6.5–8.1)
Total Bilirubin: 0.2 mg/dL — ABNORMAL LOW (ref 0.3–1.2)

## 2014-08-14 LAB — TSH: TSH: 1.125 u[IU]/mL (ref 0.350–4.500)

## 2014-08-14 LAB — MAGNESIUM: MAGNESIUM: 2.1 mg/dL (ref 1.7–2.4)

## 2014-08-14 LAB — TROPONIN I: Troponin I: <0.03 ng/mL (ref <0.031)

## 2014-08-14 NOTE — Consult Note (Signed)
Cardiology Consultation Note  Patient ID: Devin Parsons, MRN: [id number], DOB/AGE: [birth year] 15 y.o. Admit date: 08/13/2014   Date of Consult: 08/14/2014 Primary Physician: Lelon Huh, MD Primary Cardiologist: New to [facility]  Chief Complaint: Presyncope/syncope and palpitations  Reason for Consult: Same  HPI: 44 y.o. male with h/o ongoing tobacco abuse who presented to [facility] ED on 7/18 for syncopal episode and was found to be in SVT and hypotensive with SBP in the 70's treated with IV fluids and left AMA who presented to [facility] on 7/21 after recurrent presyncopal episode at work.   He has no known prior cardiac history. No prior echo until 08/13/2014, no prior stress test or cardiac catheterization. He drinks 4-5 cups of coffee daily, several Monster energy drinks, and recently started taking diet pills in an effort to lose weight. He has started working out at [employer] and eating healthy. In combination of the above he has lost 60 pounds in the past several months, planned. He continues to smoke tobacco. No illegal drug use or alcohol use.   He presented to [facility] ED on 7/18 after walking his dogs and using the restroom and suffering a syncopal episode. Per both the patient and his wife he was out for a few seconds, then came back on his own. He denies any chest pain prior to the event. No associated diaphoresis, nausea, or vomiting. Upon his arrival to the ED that day he was found to be in SVT, initially thought to be atrial flutter vs Afib. He received IV fluids 2/2 hypotension with SBP in the 70's. Troponin negative x 2. Negative CXR. Normal TSH. It recommended he be admitted for work up, though he left AMA.   On 7/21 while at work in a meeting he had a presyncopal episode with associated flushing. No syncope/LOC. Again, no associated chest pain. He presented to [facility] for further evaluation. Upon his arrival he was found to be in NSR with HR in the 80s. Troponin negative x 2. Negative CXR. Echo showed  normal LV function, normal wall motion, GR1DD. BP has been in the low 100's to 1-teens systolic. He has been under a fair amount of stress at work. He is currently asymptomatic and resting in his bed.        Past Medical History  Diagnosis Date  . Patient denies medical problems       Most Recent Cardiac Studies: Echo 08/13/2014:  Study Conclusions  - Left ventricle: The cavity size was normal. Wall thickness was normal. Systolic function was normal. The estimated ejection fraction was in the range of 55% to 60%. Wall motion was normal; there were no regional wall motion abnormalities. Doppler parameters are consistent with abnormal left ventricular relaxation (grade 1 diastolic dysfunction). - Pulmonary arteries: Systolic pressure was within the normal range.   Surgical History:  Past Surgical History  Procedure Laterality Date  . Fracture surgery       Home Meds: Prior to Admission medications   Medication Sig Start Date End Date Taking? Authorizing Provider  ibuprofen (ADVIL,MOTRIN) 200 MG tablet Take 400-600 mg by mouth 2 (two) times daily as needed for headache or mild pain.    Yes Historical Provider, MD  nabumetone (RELAFEN) 500 MG tablet Take 500 mg by mouth daily as needed for mild pain.   Yes Historical Provider, MD    Inpatient Medications:  . aspirin EC  325 mg Oral Daily  . enoxaparin (LOVENOX) injection  40 mg Subcutaneous Q24H  . famotidine  20 mg Oral Daily      Allergies: No Known Allergies  History   Social History  . Marital Status: Married    Spouse Name: N/A  . Number of Children: N/A  . Years of Education: N/A   Occupational History  . Not on file.   Social History Main Topics  . Smoking status: Current Every Day Smoker -- 1.00 packs/day for 30 years  . Smokeless tobacco: Not on file  . Alcohol Use: No  . Drug Use: No  . Sexual Activity: Yes   Other Topics Concern  . Not on file   Social History Narrative     Family  History  Problem Relation Age of Onset  . Family history unknown: Yes     Review of Systems: Review of Systems  Constitutional: Positive for weight loss and malaise/fatigue. Negative for fever, chills and diaphoresis.       Planned weight loss of 60 pounds through diet changes, exercise, and diet pills  HENT: Negative for congestion.   Eyes: Negative for discharge and redness.  Respiratory: Negative for cough, hemoptysis, sputum production, shortness of breath and wheezing.   Cardiovascular: Positive for palpitations. Negative for chest pain, orthopnea, claudication, leg swelling and PND.  Gastrointestinal: Negative for heartburn, nausea, vomiting and abdominal pain.  Musculoskeletal: Negative for myalgias, back pain, joint pain, falls and neck pain.  Skin: Negative for rash.  Neurological: Positive for loss of consciousness and weakness. Negative for dizziness, sensory change, speech change and focal weakness.  Endo/Heme/Allergies: Does not bruise/bleed easily.  Psychiatric/Behavioral: Positive for substance abuse. The patient is not nervous/anxious.        Current tobacco abuse  All other systems reviewed and are negative.    Labs:  Recent Labs  08/13/14 1254 08/13/14 1759 08/14/14 0010  TROPONINI <0.03 <0.03 <0.03   Lab Results  Component Value Date   WBC 8.1 08/14/2014   HGB 13.9 08/14/2014   HCT 40.9 08/14/2014   MCV 87.6 08/14/2014   PLT 241 08/14/2014     Recent Labs Lab 08/14/14 0603  NA 141  K 4.1  CL 106  CO2 27  BUN 10  CREATININE 0.74  CALCIUM 9.1  PROT 7.1  BILITOT 0.2*  ALKPHOS 80  ALT 56  AST 25  GLUCOSE 106*   Lab Results  Component Value Date   CHOL 158 08/14/2014   HDL 22* 08/14/2014   LDLCALC 110* 08/14/2014   TRIG 132 08/14/2014   No results found for: DDIMER  Radiology/Studies:  Dg Chest 1 View  08/10/2014   CLINICAL DATA:  Syncope.  EXAM: CHEST  1 VIEW  COMPARISON:  None.  FINDINGS: Mediastinum and hilar structures are normal.  Heart size normal. No pleural effusion or pneumothorax. No acute bony abnormality.  IMPRESSION: No acute cardiopulmonary disease.   Electronically Signed   By: Marcello Moores  Register   On: 08/10/2014 09:29   Dg Chest 2 View  08/13/2014   CLINICAL DATA:  Fatigue and dizziness. Intermittent shortness of breath  EXAM: CHEST  2 VIEW  COMPARISON:  July 31, 2014  FINDINGS: The lungs are clear. The heart size and pulmonary vascularity are normal. No adenopathy. No bone lesions.  IMPRESSION: No edema or consolidation.   Electronically Signed   By: Lowella Grip III M.D.   On: 08/13/2014 13:35    EKG: 7/18: SVT, 201 bpm, nonspecific st/t changes; follow up EKF sinus tachycardia, 107 bpm, nonspecific st/t changes inferior leads and V2;  7/21: NSR, 82 bpm,  no significant st/t changes   Weights: Filed Weights   08/13/14 1251 08/13/14 1834  Weight: 180 lb (81.647 kg) 185 lb 8 oz (84.142 kg)     Physical Exam: Blood pressure 119/70, pulse 71, temperature 98.1 F (36.7 C), temperature source Oral, resp. rate 18, height 5\' 11"  (1.803 m), weight 185 lb 8 oz (84.142 kg), SpO2 96 %. Body mass index is 25.88 kg/(m^2). General: Well developed, well nourished, in no acute distress. Head: Normocephalic, atraumatic, sclera non-icteric, no xanthomas, nares are without discharge.  Neck: Negative for carotid bruits. JVD not elevated. Lungs: Clear bilaterally to auscultation without wheezes, rales, or rhonchi. Breathing is unlabored. Heart: RRR with S1 S2. No murmurs, rubs, or gallops appreciated. Abdomen: Soft, non-tender, non-distended with normoactive bowel sounds. No hepatomegaly. No rebound/guarding. No obvious abdominal masses. Msk:  Strength and tone appear normal for age. Extremities: Multiple tattoos throughout. No clubbing or cyanosis. No edema.  Distal pedal pulses are 2+ and equal bilaterally. Neuro: Alert and oriented X 3. No facial asymmetry. No focal deficit. Moves all extremities spontaneously. Psych:   Responds to questions appropriately with a normal affect.    Assessment and Plan:  44 y.o. male with h/o ongoing tobacco abuse who presented to [facility] ED on 7/18 for syncopal episode and was found to be in SVT and hypotensive with SBP in the 70's treated with IV fluids and left AMA who presented to [facility] on 7/21 after recurrent presyncopal episode at work.   1. Syncope/presyncope/SVT: -Appears to be in the setting of hypotension with associated SVT initially. No documented arrhythmia during the most recent visit to the ED on 7/21 or this admission, though that does not rule out arrhythmia occuring during the episode he felt while at work -Patient has history of drinking numerous cups of coffee, Monster energy drinks, and diet pills possibly leading to new onset SVT -Have advised patient to not take diet pills, stop Monster energy drinks and decrease is coffee intake to no more than 1 cup daily, perhaps less -No events on tele while inpatient, normal LV function with GR1DD. No known history of HTN.  -Plan for outpatient event monitor to evaluate for frequent arrhythmia -SVT likely in the setting of increased stress, caffeine intake, diet pills, and dehydration. Unlikely underlying ischemia given significant tachycardia seen initially on 7/18 and negative troponins. Given GR1DD seen on echo in the setting of no known HTN, could evaluate for other underlying etiologies including DM and possible ischemia, though less likely the latter  -TSH, Mg, K+ all ok -Plan for outpatient Myoview  -PRN metoprolol tachy-palpitations    2. Ongoing tobacco abuse: -Cessation advised   3. Dispo: -Needs outpatient CPE   Signed, Christell Faith, PA-C Pager: [phone] 08/14/2014, 9:50 AM

## 2014-08-14 NOTE — Telephone Encounter (Signed)
Patient contacted regarding discharge from [facility] on 7/22.  Patient understands to follow up with provider Christell Faith on 8/1 at 2:30 at [facility] office. Patient understands discharge instructions?  Patient understands medications and regiment?  Patient understands to bring all medications to this visit?   Left detailed message on VM with CB number if questions.

## 2014-08-14 NOTE — Progress Notes (Signed)
Pt educated re falls, refuses bed alarm

## 2014-08-14 NOTE — Discharge Summary (Signed)
Devin Parsons, 44 y.o., DOB 26-Sep-1970, MRN [id number]. Admission date: 08/13/2014 Discharge Date 08/14/2014 Primary MD Lelon Huh, MD Admitting Physician Nicholes Mango, MD  Admission Diagnosis  Near syncope [R55] Other fatigue [R53.83]  Discharge Diagnosis   Active Problems:   Syncope, near  recent intentional weight loss Tachycardia        Hospital Course patient is a 44 year old white male with history of tobacco abuse came to the emergency room after he presented with near syncope episode. Patient was seen with similar type presentation 3 days ago. At that time he was advised to stay but he refused to stay in the hospital and left from the ER. Patient has recently lost significant amount of weight intentional in nature he has been taking over-the-counter diet supplement. Which he stopped taking 2 weeks ago. He also has been drinking a lot of high energy drinks 4 years. Which is cut down as well day prior to his syncopal episode. Patient was noted to be dehydrated and hypotensive when he arrived 3 days ago. Patient was admitted yesterday for dyspnea or syncope overnight no arrhythmias were noted on telemetry he is remaining normal sinus rhythm. He had a echocardiogram which showed a normal EF. He was seen by cardiology and they will put a Holter monitor on him on Monday. But at this time is doing well and is stable for discharge.           Consults  cardiology  Significant Tests:  See full reports for all details    Dg Chest 1 View  08/10/2014   CLINICAL DATA:  Syncope.  EXAM: CHEST  1 VIEW  COMPARISON:  None.  FINDINGS: Mediastinum and hilar structures are normal. Heart size normal. No pleural effusion or pneumothorax. No acute bony abnormality.  IMPRESSION: No acute cardiopulmonary disease.   Electronically Signed   By: Marcello Moores  Register   On: 08/10/2014 09:29   Dg Chest 2 View  08/13/2014   CLINICAL DATA:  Fatigue and dizziness. Intermittent shortness of breath  EXAM: CHEST   2 VIEW  COMPARISON:  July 31, 2014  FINDINGS: The lungs are clear. The heart size and pulmonary vascularity are normal. No adenopathy. No bone lesions.  IMPRESSION: No edema or consolidation.   Electronically Signed   By: Lowella Grip III M.D.   On: 08/13/2014 13:35       Today   Subjective:   Devin Parsons  feels much better denies any complaints  Objective:   Blood pressure 116/78, pulse 71, temperature 98 F (36.7 C), temperature source Oral, resp. rate 18, height 5\' 11"  (1.803 m), weight 84.142 kg (185 lb 8 oz), SpO2 96 %.  .  Intake/Output Summary (Last 24 hours) at 08/14/14 1249 Last data filed at 08/14/14 0753  Gross per 24 hour  Intake      0 ml  Output      0 ml  Net      0 ml    Exam VITAL SIGNS: Blood pressure 116/78, pulse 71, temperature 98 F (36.7 C), temperature source Oral, resp. rate 18, height 5\' 11"  (1.803 m), weight 84.142 kg (185 lb 8 oz), SpO2 96 %.  GENERAL:  44 y.o.-year-old patient lying in the bed with no acute distress.  EYES: Pupils equal, round, reactive to light and accommodation. No scleral icterus. Extraocular muscles intact.  HEENT: Head atraumatic, normocephalic. Oropharynx and nasopharynx clear.  NECK:  Supple, no jugular venous distention. No thyroid enlargement, no tenderness.  LUNGS: Normal breath sounds bilaterally,  no wheezing, rales,rhonchi or crepitation. No use of accessory muscles of respiration.  CARDIOVASCULAR: S1, S2 normal. No murmurs, rubs, or gallops.  ABDOMEN: Soft, nontender, nondistended. Bowel sounds present. No organomegaly or mass.  EXTREMITIES: No pedal edema, cyanosis, or clubbing.  NEUROLOGIC: Cranial nerves II through XII are intact. Muscle strength 5/5 in all extremities. Sensation intact. Gait not checked.  PSYCHIATRIC: The patient is alert and oriented x 3.  SKIN: No obvious rash, lesion, or ulcer.   Data Review     CBC w Diff: Lab Results  Component Value Date   WBC 8.1 08/14/2014   HGB 13.9  08/14/2014   HCT 40.9 08/14/2014   PLT 241 08/14/2014   CMP: Lab Results  Component Value Date   NA 141 08/14/2014   K 4.1 08/14/2014   CL 106 08/14/2014   CO2 27 08/14/2014   BUN 10 08/14/2014   CREATININE 0.74 08/14/2014   PROT 7.1 08/14/2014   ALBUMIN 3.6 08/14/2014   BILITOT 0.2* 08/14/2014   ALKPHOS 80 08/14/2014   AST 25 08/14/2014   ALT 56 08/14/2014  .  Micro Results No results found for this or any previous visit (from the past 240 hour(s)).      Code Status Orders        Start     Ordered   08/13/14 1828  Full code   Continuous     08/13/14 1827          Follow-up Information    Follow up with DUNN,RYAN, PA-C On 08/24/2014.   Specialties:  Physician Assistant, Interventional Cardiology, Radiology   Why:  at 2:30pm.    Contact information:   [place] [address] [phone]       Discharge Medications     Medication List    TAKE these medications        ibuprofen 200 MG tablet  Commonly known as:  ADVIL,MOTRIN  Take 400-600 mg by mouth 2 (two) times daily as needed for headache or mild pain.     nabumetone 500 MG tablet  Commonly known as:  RELAFEN  Take 500 mg by mouth daily as needed for mild pain.           Total Time in preparing paper work, data evaluation and todays exam - 35 minutes  Dustin Flock M.D on 08/14/2014 at 12:49 PM  [facility]   Office  [phone]

## 2014-08-14 NOTE — Progress Notes (Signed)
[facility] was admitted to the Hospital on 08/13/2014 and Discharged  08/14/2014 and should be excused from work/school   for 1days starting 08/13/2014 , may return to work/school without any restrictions.  Call Bettey Costa MD with questions.  Dustin Flock M.D on 08/14/2014,at 12:02 PM  [place] [facility]  [phone]

## 2014-08-14 NOTE — Discharge Instructions (Signed)

## 2014-08-17 ENCOUNTER — Other Ambulatory Visit: Payer: Self-pay

## 2014-08-17 DIAGNOSIS — I471 Supraventricular tachycardia: Secondary | ICD-10-CM

## 2014-08-17 DIAGNOSIS — R55 Syncope and collapse: Secondary | ICD-10-CM

## 2014-08-19 ENCOUNTER — Encounter (INDEPENDENT_AMBULATORY_CARE_PROVIDER_SITE_OTHER): Payer: Medicaid Other

## 2014-08-19 DIAGNOSIS — R55 Syncope and collapse: Secondary | ICD-10-CM | POA: Diagnosis not present

## 2014-08-19 DIAGNOSIS — I471 Supraventricular tachycardia: Secondary | ICD-10-CM | POA: Diagnosis not present

## 2014-08-23 ENCOUNTER — Encounter: Payer: Self-pay | Admitting: Physician Assistant

## 2014-08-24 ENCOUNTER — Encounter: Payer: Self-pay | Admitting: Physician Assistant

## 2014-08-24 ENCOUNTER — Ambulatory Visit (INDEPENDENT_AMBULATORY_CARE_PROVIDER_SITE_OTHER): Payer: Medicaid Other | Admitting: Physician Assistant

## 2014-08-24 VITALS — BP 120/88 | HR 81 | Ht 71.0 in | Wt 189.5 lb

## 2014-08-24 DIAGNOSIS — R55 Syncope and collapse: Secondary | ICD-10-CM | POA: Diagnosis not present

## 2014-08-24 DIAGNOSIS — I471 Supraventricular tachycardia, unspecified: Secondary | ICD-10-CM | POA: Insufficient documentation

## 2014-08-24 DIAGNOSIS — Z720 Tobacco use: Secondary | ICD-10-CM | POA: Diagnosis not present

## 2014-08-24 DIAGNOSIS — R5383 Other fatigue: Secondary | ICD-10-CM

## 2014-08-24 NOTE — Patient Instructions (Addendum)
Medication Instructions:  Your physician recommends that you continue on your current medications as directed. Please refer to the Current Medication list given to you today.   Labwork: none  Testing/Procedures: Your physician has requested that you have a lexiscan myoview.  [place]  Your caregiver has ordered a Stress Test with nuclear imaging. The purpose of this test is to evaluate the blood supply to your heart muscle. This procedure is referred to as a "Non-Invasive Stress Test." This is because other than having an IV started in your vein, nothing is inserted or "invades" your body. Cardiac stress tests are done to find areas of poor blood flow to the heart by determining the extent of coronary artery disease (CAD). Some patients exercise on a treadmill, which naturally increases the blood flow to your heart, while others who are  unable to walk on a treadmill due to physical limitations have a pharmacologic/chemical stress agent called Lexiscan . This medicine will mimic walking on a treadmill by temporarily increasing your coronary blood flow.   Please note: these test may take anywhere between 2-4 hours to complete  PLEASE REPORT TO [place] TO GO  Date of Procedure: Thursday, August 4, 7:30am Arrival Time for Procedure: 7:00am  I PLEASE NOTIFY THE OFFICE AT LEAST 48 HOURS IN ADVANCE IF YOU ARE UNABLE TO KEEP YOUR APPOINTMENT.  [phone] AND  PLEASE NOTIFY NUCLEAR MEDICINE AT [facility] AT LEAST 24 HOURS IN ADVANCE IF YOU ARE UNABLE TO KEEP YOUR APPOINTMENT. [phone]  How to prepare for your Myoview test:   Do not eat or drink after midnight  No caffeine for 24 hours prior to test  No smoking 24 hours prior to test.  Your medication may be taken with water.  If your doctor stopped a medication because of this test, do not take that medication.  Ladies, please do not wear dresses.  Skirts or  pants are appropriate. Please wear a short sleeve shirt.  No perfume, cologne or lotion.  Wear comfortable walking shoes. No heels!            Follow-Up: Your physician recommends that you schedule a follow-up appointment in: six weeks with Christell Faith, PA-C   Any Other Special Instructions Will Be Listed Below (If Applicable).  Nuclear Medicine Exam A nuclear medicine exam is a safe and painless imaging test. It helps to detect and diagnose disease in the body as well as provide information about organ function and structure.  Nuclear scans are most often done of the:  Lungs.  Heart.  Thyroid gland.  Bones.  Abdomen. HOW A NUCLEAR MEDICINE EXAM WORKS A nuclear medicine exam works by using a radioactive tracer. The material is given either by an IV (intravenous) injection or it may be swallowed. After the tracer is in the body, it is absorbed by your body's organs. A large scanning machine that uses a special camera detects the radioactivity in your body. A computerized image is then formed regarding the area of concern. The small amounts of radioactive material used in a nuclear medicine exam are found to be medically safe. However, because radioactive material is used, this test is not done if you are pregnant or nursing.  BEFORE THE PROCEDURE  If available, bring previous imaging studies such as x-rays, etc. with you to the exam.  Arrive early for your exam. PROCEDURE  An IV may be started before the exam begins.  Depending on the type of examination, will lie on a table or sit in a chair during the exam.  The nuclear medicine exam will take about 30 to 60 minutes to complete. AFTER THE PROCEDURE  After your scan is completed, the image(s) will be evaluated by a specialist. It is important that you follow up with your caregiver to find out your test results.  You may return to your regular activity as instructed by your caregiver. SEEK IMMEDIATE MEDICAL CARE  IF: You have shortness of breath or difficulty breathing. MAKE SURE YOU:   Understand these instructions.  Will watch your condition.  Will get help right away if you are not doing well or get worse. Document Released: 02/17/2004 Document Revised: 04/03/2011 Document Reviewed: 04/02/2008 [facility] Patient Information 2015 [place], [place]. This information is not intended to replace advice given to you by your health care provider. Make sure you discuss any questions you have with your health care provider.

## 2014-08-24 NOTE — Progress Notes (Signed)
Cardiology Hospital Follow Up Note:   Date of Encounter: 08/24/2014  ID: Devin Parsons, DOB 04/06/1970, MRN [id number]  PCP: Lelon Huh, MD Primary Cardiologist: Dr. Fletcher Anon, MD  Chief Complaint  Patient presents with  . other    Follow up from [facility]. Meds reviewed by the patient verbally. "doing well." Denies any spells of syncope since the hospital stay. Pt. c/o shortness of breath.      HPI:  44 year old male with history of ongoing tobacco, pSVT, and syncope abuse who presented to [facility] ED on 7/18 for syncopal episode and was found to be in SVT and hypotensive with SBP in the 70's treated with IV fluids and left AMA who presents for hospital follow up after second trip to [facility] with admission from 7/21 to 7/22 for presyncopal episode at work on 7/21.   Prior to the above recent ED visits he has no known prior cardiac history. He previously drank 4-5 cups of coffee daily, several Monster energy drinks, and recently started taking diet pills in an effort to lose weight. He has started working out at [employer] and eating healthy. In combination of the above he has lost 60 pounds in the past several months, planned. He continues to smoke tobacco. Denies illegal drug use or alcohol use.   He presented to [facility] ED on 7/18 after walking his dogs and using the restroom and suffering a syncopal episode. Per both the patient and his wife he was out for a few seconds, then came back on his own. He denies any chest pain prior to the event. Upon his arrival to the ED that day he was found to be in SVT, initially thought to be atrial flutter vs Afib. He received IV fluids 2/2 hypotension with SBP in the 70's. Troponin negative x 2. Negative CXR. Normal TSH. It recommended he be admitted for work up, though he left AMA.   On 7/21 while at work in a meeting he had a presyncopal episode with associated flushing. No syncope/LOC. Again, no associated chest pain. He presented to [facility] for further evaluation.  Upon his arrival he was found to be in NSR with HR in the 80s. Troponin negative x 2. Negative CXR. Echo showed normal LV function, normal wall motion, GR1DD. BP has been in the low 100's to 1-teens systolic. Urine drug screen was ordered, but not completed. K+ 4.1 and Mg 2.1. TSH and CBC unremarkable. Telemetry showed NSR in the 60-70s. He has been under a fair amount of stress at work lately. He was discharged with planned cessation of caffeine intake along with diet pills and to wear an event monitor to evaluate for arrhythmia burden in the outpatient setting.  He has felt well since his discharge from [facility]. No further syncopal or presyncopal episodes. He also has not had any episodes of palpitations. He drinks decaffeinated coffee 1-2 times weekly now. No further Monster energy drinks and no further diet pills. He does continue to feel fatigued.     Past Medical History  Diagnosis Date  . Tobacco abuse     a. ongoing  . SVT (supraventricular tachycardia)     a. echo 07/2014: EF 50-55%, no RWMA, GR1DD, PASP nl  . Syncope   : Past Surgical History  Procedure Laterality Date  . Fracture surgery    : Family History  Problem Relation Age of Onset  . Family history unknown: Yes  :  reports that he has been smoking.  He does not  have any smokeless tobacco history on file. He reports that he does not drink alcohol or use illicit drugs.:   Allergies:  No Known Allergies   Home Medications:  Current Outpatient Prescriptions  Medication Sig Dispense Refill  . HYDROcodone-acetaminophen (NORCO/VICODIN) 5-325 MG per tablet Take 1 tablet by mouth every 6 (six) hours as needed for moderate pain.    Marland Kitchen ibuprofen (ADVIL,MOTRIN) 200 MG tablet Take 400-600 mg by mouth 2 (two) times daily as needed for headache or mild pain.     . nabumetone (RELAFEN) 500 MG tablet Take 500 mg by mouth daily as needed for mild pain.     No current facility-administered medications for this visit.     Review of  Systems:  Review of Systems  Constitutional: Negative for fever, chills, weight loss, malaise/fatigue and diaphoresis.  HENT: Negative for congestion.   Eyes: Negative for blurred vision, discharge and redness.  Respiratory: Negative for cough, hemoptysis, sputum production, shortness of breath and wheezing.   Cardiovascular: Negative for chest pain, palpitations, orthopnea, claudication, leg swelling and PND.  Gastrointestinal: Negative for heartburn, nausea, vomiting and abdominal pain.  Genitourinary: Negative for hematuria.  Musculoskeletal: Negative for myalgias and falls.  Skin: Negative for rash.  Neurological: Negative for sensory change, speech change, focal weakness and weakness.  Endo/Heme/Allergies: Does not bruise/bleed easily.  Psychiatric/Behavioral: The patient is not nervous/anxious.   All other systems reviewed and are negative.    Physical Exam:  Blood pressure 120/88, pulse 81, height 5\' 11"  (1.803 m), weight 189 lb 8 oz (85.957 kg). BMI: Body mass index is 26.44 kg/(m^2). General: Pleasant, NAD. Psych: Normal affect. Responds to questions with normal affect.  Neuro: Alert and oriented X 3. Moves all extremities spontaneously. HEENT: Normocephalic, atraumatic. EOM intact bilaterally. Sclera anicteric.  Neck: Trachea midline. Supple without bruits or JVD. Lungs:  Respirations regular and unlabored, CTA bilaterally without wheezing, crackles, or rhonchi.  Heart: RRR, normal s3, s4. No murmurs, rubs, or gallops.  Abdomen: Soft, non-tender, non-distended, BS + x 4.  Extremities: No clubbing, cyanosis or edema. DP/PT/Radials 2+ and equal bilaterally. Multiple tattoos.    Accessory Clinical Findings:  EKG: NSR, 81 bpm, no significant st/t changes   Recent Labs: 08/14/2014: ALT 56; BUN 10; Creatinine, Ser 0.74; Hemoglobin 13.9; Magnesium 2.1; Platelets 241; Potassium 4.1; Sodium 141; TSH 1.125     Component Value Date/Time   CHOL 158 08/14/2014 0603   TRIG 132  08/14/2014 0603   HDL 22* 08/14/2014 0603   CHOLHDL 7.2 08/14/2014 0603   VLDL 26 08/14/2014 0603   LDLCALC 110* 08/14/2014 0603    Weights: Wt Readings from Last 3 Encounters:  08/24/14 189 lb 8 oz (85.957 kg)  08/13/14 185 lb 8 oz (84.142 kg)  08/10/14 160 lb (72.576 kg)    Estimated Creatinine Clearance: 122.9 mL/min (by C-G formula based on Cr of 0.74).   Other studies Reviewed: Additional studies/ records that were reviewed today include: [facility] admission.  Assessment & Plan:  1. Paroxysmal SVT:  -He remains in sinus rhythm today -He is currently wearing a 30 day event monitor, no calls from the monitoring company  -He has cut out almost all of his caffeine, perhaps it would be ok for him to start back at one cup of coffee 1-2 days weekly on the weekend while he is wearing the event monitor in an effort for data collection. He is ok with this plan  -Once we have received data from his event monitor we will then  decide on no medication, vs prn meds, vs scheduled meds  2. Syncope/presyncope:  -No further episodes -Event monitor as above -Labs, echo,and carotid doppler while inpatient all ok -He does continue to mention fatigue. Initially this was felt to be secondary to caffeine withdrawal, however his fatigue is persisting. Thus we will go ahead and schedule his for a Lexiscan Myoview to evaluate for high risk ischemia given his syncopal, presyncopal, SVT, and persisting fatigue   3. Ongoing tobacco abuse: -Cessation is advised    Dispo: -Follow up for event monitor and and Lexiscan Myoview   Current medicines are reviewed at length with the patient today.  The patient did not have any concerns regarding medicines.   Christell Faith, PA-C [facility] [place] [address] [place], [place] [address] [phone] [facility] 08/24/2014, 2:30 PM

## 2014-08-27 ENCOUNTER — Ambulatory Visit
Admission: RE | Admit: 2014-08-27 | Discharge: 2014-08-27 | Disposition: A | Discharge status: Home / Self Care | Payer: Medicaid Other | Source: Ambulatory Visit | Attending: Physician Assistant | Admitting: Physician Assistant

## 2014-08-27 DIAGNOSIS — R55 Syncope and collapse: Secondary | ICD-10-CM | POA: Diagnosis not present

## 2014-08-27 LAB — NM MYOCAR MULTI W/SPECT W/WALL MOTION / EF
CHL CUP RESTING HR STRESS: 68 {beats}/min
CSEPPHR: 116 {beats}/min
LV dias vol: 122 mL
LV sys vol: 55 mL
Percent HR: 66 %
SDS: 0
SRS: 0
SSS: 0
TID: 1.05

## 2014-08-27 MED ORDER — TECHNETIUM TC 99M SESTAMIBI - CARDIOLITE
13.0000 | Freq: Once | INTRAVENOUS | Status: AC | PRN
  Administered 2014-08-27: 07:00:00 13.84 via INTRAVENOUS

## 2014-08-27 MED ORDER — TECHNETIUM TC 99M SESTAMIBI - CARDIOLITE
33.0000 | Freq: Once | INTRAVENOUS | Status: AC | PRN
  Administered 2014-08-27: 08:00:00 32.694 via INTRAVENOUS

## 2014-08-27 MED ORDER — REGADENOSON 0.4 MG/5ML IV SOLN
0.4000 mg | Freq: Once | INTRAVENOUS | Status: AC
  Administered 2014-08-27: 0.4 mg via INTRAVENOUS
  Filled 2014-08-27: qty 5

## 2014-09-15 ENCOUNTER — Other Ambulatory Visit: Payer: Self-pay | Admitting: *Deleted

## 2014-09-15 NOTE — Telephone Encounter (Signed)
Refill request for Hydrocodone-Ace 5-325 mg Last filled by MD on- 06/16/2014 #90 x0 Last Appt: 08/05/2013 Next Appt: none Please advise refill?

## 2014-09-16 ENCOUNTER — Other Ambulatory Visit: Payer: Self-pay | Admitting: *Deleted

## 2014-09-16 MED ORDER — HYDROCODONE-ACETAMINOPHEN 5-325 MG PO TABS: 1.0000 | ORAL_TABLET | Freq: Four times a day (QID) | ORAL | Status: DC | PRN

## 2014-09-16 NOTE — Telephone Encounter (Signed)
Rx has been printed, but he has not been seen since 07/2013 and needs to schedule an office visit.

## 2014-09-16 NOTE — Telephone Encounter (Signed)
Error. Rx request for Hydrocodone has already been sent.

## 2014-09-16 NOTE — Telephone Encounter (Signed)
Pt called to see if RX was ready. TNP

## 2014-10-06 ENCOUNTER — Encounter: Payer: Self-pay | Admitting: Cardiovascular Disease

## 2014-10-06 ENCOUNTER — Ambulatory Visit (INDEPENDENT_AMBULATORY_CARE_PROVIDER_SITE_OTHER): Payer: Medicaid Other | Admitting: Cardiovascular Disease

## 2014-10-06 VITALS — BP 110/86 | HR 80 | Ht 71.0 in | Wt 193.2 lb

## 2014-10-06 DIAGNOSIS — I471 Supraventricular tachycardia: Secondary | ICD-10-CM | POA: Diagnosis not present

## 2014-10-06 NOTE — Progress Notes (Signed)
HPI  44 year old male with history of ongoing tobacco, pSVT, and syncope who is here today for a follow-up visit. She had documented SVT in July of this year in the setting of excessive caffeine intake and diet pills.  Echo showed normal LV function, normal wall motion, GR1DD.  He cut down on caffeine intake significantly and he is down to 1 cup of coffee a day. He underwent a nuclear stress test for fatigue and shortness of breath which came back normal. Outpatient telemetry showed sinus rhythm with sinus tachycardia and no other significant arrhythmia. He reports only a few brief episodes of tachycardia since his last visit lasting less than 2 minutes.  No Known Allergies   Current Outpatient Prescriptions on File Prior to Visit  Medication Sig Dispense Refill  . HYDROcodone-acetaminophen (NORCO/VICODIN) 5-325 MG per tablet Take 1 tablet by mouth every 6 (six) hours as needed for moderate pain. 90 tablet 0  . ibuprofen (ADVIL,MOTRIN) 200 MG tablet Take 400-600 mg by mouth 2 (two) times daily as needed for headache or mild pain.     . nabumetone (RELAFEN) 500 MG tablet Take 500 mg by mouth daily as needed for mild pain.     No current facility-administered medications on file prior to visit.     Past Medical History  Diagnosis Date  . Tobacco abuse     a. ongoing  . SVT (supraventricular tachycardia)     a. echo 07/2014: EF 50-55%, no RWMA, GR1DD, PASP nl  . Syncope      Past Surgical History  Procedure Laterality Date  . Fracture surgery       Family History  Problem Relation Age of Onset  . Family history unknown: Yes     Social History   Social History  . Marital Status: Married    Spouse Name: N/A  . Number of Children: N/A  . Years of Education: N/A   Occupational History  . Not on file.   Social History Main Topics  . Smoking status: Current Every Day Smoker -- 1.00 packs/day for 30 years  . Smokeless tobacco: Not on file  . Alcohol Use: No  . Drug  Use: No  . Sexual Activity: Yes   Other Topics Concern  . Not on file   Social History Narrative      PHYSICAL EXAM   BP 110/86 mmHg  Pulse 80  Ht 5\' 11"  (1.803 m)  Wt 193 lb 4 oz (87.658 kg)  BMI 26.96 kg/m2 Constitutional: He is oriented to person, place, and time. He appears well-developed and well-nourished. No distress.  HENT: No nasal discharge.  Head: Normocephalic and atraumatic.  Eyes: Pupils are equal and round.  No discharge. Neck: Normal range of motion. Neck supple. No JVD present. No thyromegaly present.  Cardiovascular: Normal rate, regular rhythm, normal heart sounds. Exam reveals no gallop and no friction rub. No murmur heard.  Pulmonary/Chest: Effort normal and breath sounds normal. No stridor. No respiratory distress. He has no wheezes. He has no rales. He exhibits no tenderness.  Abdominal: Soft. Bowel sounds are normal. He exhibits no distension. There is no tenderness. There is no rebound and no guarding.  Musculoskeletal: Normal range of motion. He exhibits no edema and no tenderness.  Neurological: He is alert and oriented to person, place, and time. Coordination normal.  Skin: Skin is warm and dry. No rash noted. He is not diaphoretic. No erythema. No pallor.  Psychiatric: He has a normal mood and affect.  His behavior is normal. Judgment and thought content normal.       EKG: Sinus  Rhythm  WITHIN NORMAL LIMITS   ASSESSMENT AND PLAN

## 2014-10-06 NOTE — Assessment & Plan Note (Signed)
This was mostly in the setting of excessive caffeine intake. I discussed with him vagal maneuvers to terminate this if there are recurrent episodes. If he develops recurrent episodes, an as needed beta blocker can be added.

## 2014-10-06 NOTE — Patient Instructions (Signed)
Medication Instructions: No new medications.  Labwork: None.   Procedures/Testing: None.   Follow-Up: As needed.   Any Additional Special Instructions Will Be Listed Below (If Applicable).

## 2014-10-23 ENCOUNTER — Telehealth: Payer: Self-pay

## 2014-10-23 DIAGNOSIS — G8929 Other chronic pain: Secondary | ICD-10-CM

## 2014-10-23 DIAGNOSIS — M549 Dorsalgia, unspecified: Principal | ICD-10-CM

## 2014-10-23 MED ORDER — HYDROCODONE-ACETAMINOPHEN 5-325 MG PO TABS: 1.0000 | ORAL_TABLET | Freq: Four times a day (QID) | ORAL | Status: DC | PRN

## 2014-10-23 NOTE — Telephone Encounter (Signed)
Medication refill approved by Dr. Caryn Section. Left message on patient voice mail that prescription is ready for pick up and that patient is due for an office visit.

## 2014-11-19 DIAGNOSIS — D1801 Hemangioma of skin and subcutaneous tissue: Secondary | ICD-10-CM | POA: Insufficient documentation

## 2014-11-19 DIAGNOSIS — R42 Dizziness and giddiness: Secondary | ICD-10-CM | POA: Insufficient documentation

## 2014-11-19 DIAGNOSIS — M549 Dorsalgia, unspecified: Secondary | ICD-10-CM

## 2014-11-19 DIAGNOSIS — E782 Mixed hyperlipidemia: Secondary | ICD-10-CM | POA: Insufficient documentation

## 2014-11-19 DIAGNOSIS — H8190 Unspecified disorder of vestibular function, unspecified ear: Secondary | ICD-10-CM | POA: Insufficient documentation

## 2014-11-19 DIAGNOSIS — G8929 Other chronic pain: Secondary | ICD-10-CM | POA: Insufficient documentation

## 2014-11-19 DIAGNOSIS — I781 Nevus, non-neoplastic: Secondary | ICD-10-CM

## 2014-11-20 ENCOUNTER — Ambulatory Visit (INDEPENDENT_AMBULATORY_CARE_PROVIDER_SITE_OTHER): Payer: Self-pay | Admitting: Family Medicine

## 2014-11-20 ENCOUNTER — Encounter: Payer: Self-pay | Admitting: Family Medicine

## 2014-11-20 VITALS — BP 130/92 | HR 88 | Temp 98.3°F | Resp 16 | Ht 71.0 in | Wt 206.0 lb

## 2014-11-20 DIAGNOSIS — G8929 Other chronic pain: Secondary | ICD-10-CM

## 2014-11-20 DIAGNOSIS — M5020 Other cervical disc displacement, unspecified cervical region: Secondary | ICD-10-CM

## 2014-11-20 DIAGNOSIS — M549 Dorsalgia, unspecified: Secondary | ICD-10-CM

## 2014-11-20 DIAGNOSIS — Z720 Tobacco use: Secondary | ICD-10-CM

## 2014-11-20 MED ORDER — HYDROCODONE-ACETAMINOPHEN 5-325 MG PO TABS: 1.0000 | ORAL_TABLET | Freq: Four times a day (QID) | ORAL | Status: DC | PRN

## 2014-11-20 MED ORDER — BUPROPION HCL ER (SR) 150 MG PO TB12: ORAL_TABLET | ORAL | Status: DC

## 2014-11-20 MED ORDER — NABUMETONE 500 MG PO TABS: 500.0000 mg | ORAL_TABLET | Freq: Four times a day (QID) | ORAL | Status: DC | PRN

## 2014-11-20 NOTE — Progress Notes (Signed)
Patient: Devin Parsons Male    DOB: August 28, 1970   44 y.o.   MRN: [id number] Visit Date: 11/20/2014  Today's Provider: Lelon Huh, MD   Chief Complaint  Patient presents with  . Back Pain    follow up   Subjective:    Back Pain This is a chronic problem. The problem is unchanged. Associated symptoms include leg pain. Pertinent negatives include no abdominal pain, chest pain, fever, perianal numbness, tingling or weakness. The treatment provided moderate relief.  Patient currently take Hydrocodone 5-325mg  every 6 hours. Patient reports he sometimes he has to take an additional tablet to worsening pain during cold weather.  Smoking Cessation: Patient comes in today requesting a prescription to help his stop smoking. Patient currently smokes 1 ppd. He took bupropion for smoking cessation a few years ago and states he did well with it, having quit for about 6 months. He would like to try this again.      No Known Allergies Previous Medications   HYDROCODONE-ACETAMINOPHEN (NORCO/VICODIN) 5-325 MG TABLET    Take 1 tablet by mouth every 6 (six) hours as needed for moderate pain.   IBUPROFEN (ADVIL,MOTRIN) 200 MG TABLET    Take 400-600 mg by mouth 2 (two) times daily as needed for headache or mild pain.    NABUMETONE (RELAFEN) 500 MG TABLET    Take 500 mg by mouth every 6 (six) hours as needed for mild pain.     Review of Systems  Constitutional: Positive for fatigue. Negative for fever, chills and appetite change.  Respiratory: Negative for chest tightness, shortness of breath and wheezing.   Cardiovascular: Positive for palpitations. Negative for chest pain.  Gastrointestinal: Negative for nausea, vomiting and abdominal pain.  Musculoskeletal: Positive for back pain.  Neurological: Negative for tingling and weakness.    Social History  Substance Use Topics  . Smoking status: Current Every Day Smoker -- 1.00 packs/day for 30 years  . Smokeless tobacco: Not on file  .  Alcohol Use: No     Comment: formerly a heavy drinker in high school   Objective:   BP 130/92 mmHg  Pulse 88  Temp(Src) 98.3 F (36.8 C) (Oral)  Resp 16  Ht 5\' 11"  (1.803 m)  Wt 206 lb (93.441 kg)  BMI 28.74 kg/m2  SpO2 97%  Physical Exam  General appearance: alert, well developed, well nourished, cooperative and in no distress Head: Normocephalic, without obvious abnormality, atraumatic Lungs: Respirations even and unlabored Extremities: No gross deformities Skin: Skin color, texture, turgor normal. No rashes seen  Psych: Appropriate mood and affect. Neurologic: Mental status: Alert, oriented to person, place, and time, thought content appropriate. MS: Tender lumbar spine and para-lumbar muscles.     Assessment & Plan:     1. Chronic back pain  - HYDROcodone-acetaminophen (NORCO/VICODIN) 5-325 MG tablet; Take 1 tablet by mouth every 6 (six) hours as needed for moderate pain.  Dispense: 90 tablet; Refill: 0 - nabumetone (RELAFEN) 500 MG tablet; Take 1 tablet (500 mg total) by mouth every 6 (six) hours as needed for mild pain.  Dispense: 60 tablet; Refill: 5  2. Tobacco abuse Did well with buproprion in the past - buPROPion (WELLBUTRIN SR) 150 MG 12 hr tablet; 1 tablet daily for 3 days, then 1 tablet twice daily. Stop smoking 14 days after starting medication  Dispense: 60 tablet; Refill: 6  3. Back pain, chronic   4. Displacement of cervical intervertebral disc without myelopathy  Lelon Huh, MD  [facility]

## 2014-11-23 ENCOUNTER — Encounter: Payer: Self-pay | Admitting: Family Medicine

## 2014-12-22 ENCOUNTER — Other Ambulatory Visit: Payer: Self-pay | Admitting: Family Medicine

## 2014-12-22 DIAGNOSIS — M549 Dorsalgia, unspecified: Principal | ICD-10-CM

## 2014-12-22 DIAGNOSIS — G8929 Other chronic pain: Secondary | ICD-10-CM

## 2014-12-22 MED ORDER — HYDROCODONE-ACETAMINOPHEN 5-325 MG PO TABS: 1.0000 | ORAL_TABLET | Freq: Four times a day (QID) | ORAL | Status: DC | PRN

## 2014-12-22 NOTE — Telephone Encounter (Signed)
Pt needs refill HYDROcodone-acetaminophen (NORCO/VICODIN) 5-325 MG tablet 11/20/14 -- Birdie Sons, MD Take 1 tablet by mouth every 6 (six) hours as needed for moderate pain  Call back [phone]  Thanks Con Memos

## 2014-12-22 NOTE — Telephone Encounter (Signed)
Last refilled on 11/20/14 #90 with 0 RF.

## 2014-12-23 ENCOUNTER — Telehealth: Payer: Self-pay | Admitting: Family Medicine

## 2015-01-26 ENCOUNTER — Other Ambulatory Visit: Payer: Self-pay | Admitting: Family Medicine

## 2015-01-26 DIAGNOSIS — M549 Dorsalgia, unspecified: Principal | ICD-10-CM

## 2015-01-26 DIAGNOSIS — G8929 Other chronic pain: Secondary | ICD-10-CM

## 2015-01-26 NOTE — Telephone Encounter (Signed)
Pt needs refill HYDROcodone-acetaminophen (NORCO/VICODIN) 5-325 MG tablet   Please call when ready [phone]  [facility]

## 2015-01-27 MED ORDER — HYDROCODONE-ACETAMINOPHEN 5-325 MG PO TABS: 1.0000 | ORAL_TABLET | Freq: Four times a day (QID) | ORAL | Status: DC | PRN

## 2015-03-02 ENCOUNTER — Other Ambulatory Visit: Payer: Self-pay | Admitting: *Deleted

## 2015-03-02 DIAGNOSIS — M549 Dorsalgia, unspecified: Principal | ICD-10-CM

## 2015-03-02 DIAGNOSIS — G8929 Other chronic pain: Secondary | ICD-10-CM

## 2015-03-02 MED ORDER — HYDROCODONE-ACETAMINOPHEN 5-325 MG PO TABS: 1.0000 | ORAL_TABLET | Freq: Four times a day (QID) | ORAL | Status: DC | PRN

## 2015-03-02 NOTE — Telephone Encounter (Signed)
Refill HYDROcodone-acetaminophen (NORCO/VICODIN) 5-325 MG tablet 01/27/15 -- Birdie Sons, MD Take 1 tablet by mouth every 6 (six) hours as needed for moderate pain

## 2015-03-30 ENCOUNTER — Other Ambulatory Visit: Payer: Self-pay | Admitting: *Deleted

## 2015-03-30 DIAGNOSIS — G8929 Other chronic pain: Secondary | ICD-10-CM

## 2015-03-30 DIAGNOSIS — M549 Dorsalgia, unspecified: Principal | ICD-10-CM

## 2015-03-30 MED ORDER — HYDROCODONE-ACETAMINOPHEN 5-325 MG PO TABS: 1.0000 | ORAL_TABLET | Freq: Four times a day (QID) | ORAL | Status: DC | PRN

## 2015-04-30 ENCOUNTER — Other Ambulatory Visit: Payer: Self-pay

## 2015-04-30 DIAGNOSIS — M549 Dorsalgia, unspecified: Principal | ICD-10-CM

## 2015-04-30 DIAGNOSIS — G8929 Other chronic pain: Secondary | ICD-10-CM

## 2015-04-30 MED ORDER — HYDROCODONE-ACETAMINOPHEN 5-325 MG PO TABS: 1.0000 | ORAL_TABLET | Freq: Four times a day (QID) | ORAL | Status: DC | PRN

## 2015-04-30 NOTE — Telephone Encounter (Signed)
Patient sent e mail requesting refill. Patient call back [phone].

## 2015-06-07 ENCOUNTER — Other Ambulatory Visit: Payer: Self-pay | Admitting: Family Medicine

## 2015-06-07 DIAGNOSIS — M549 Dorsalgia, unspecified: Principal | ICD-10-CM

## 2015-06-07 DIAGNOSIS — G8929 Other chronic pain: Secondary | ICD-10-CM

## 2015-06-07 NOTE — Telephone Encounter (Signed)
Pt needs refill on HYDROcodone-acetaminophen (NORCO/VICODIN) 5-325 MG tablet   Thanks, Con Memos

## 2015-06-08 MED ORDER — HYDROCODONE-ACETAMINOPHEN 5-325 MG PO TABS: 1.0000 | ORAL_TABLET | Freq: Four times a day (QID) | ORAL | Status: DC | PRN

## 2015-07-20 ENCOUNTER — Other Ambulatory Visit: Payer: Self-pay | Admitting: Family Medicine

## 2015-07-20 DIAGNOSIS — G8929 Other chronic pain: Secondary | ICD-10-CM

## 2015-07-20 DIAGNOSIS — M549 Dorsalgia, unspecified: Principal | ICD-10-CM

## 2015-07-20 MED ORDER — HYDROCODONE-ACETAMINOPHEN 5-325 MG PO TABS: 1.0000 | ORAL_TABLET | Freq: Four times a day (QID) | ORAL | Status: DC | PRN

## 2015-07-20 NOTE — Telephone Encounter (Signed)
Last office visit for Chronic Back pain follow up 11/20/2014. Prescription was last refilled 06/08/2015.

## 2015-07-20 NOTE — Telephone Encounter (Signed)
Prescription printed. Please notify patient it is ready for pick up. Thanks- Dr. Cathleen Yagi.  

## 2015-07-20 NOTE — Telephone Encounter (Signed)
Pt requesting medication for HYDROcodone-acetaminophen (NORCO/VICODIN) 5-325 MG tablet

## 2015-08-31 ENCOUNTER — Ambulatory Visit (INDEPENDENT_AMBULATORY_CARE_PROVIDER_SITE_OTHER): Payer: BLUE CROSS/BLUE SHIELD | Admitting: Family Medicine

## 2015-08-31 ENCOUNTER — Encounter: Payer: Self-pay | Admitting: Family Medicine

## 2015-08-31 VITALS — BP 164/110 | HR 76 | Temp 98.7°F | Ht 71.0 in | Wt 233.0 lb

## 2015-08-31 DIAGNOSIS — R002 Palpitations: Secondary | ICD-10-CM | POA: Diagnosis not present

## 2015-08-31 DIAGNOSIS — Z720 Tobacco use: Secondary | ICD-10-CM

## 2015-08-31 DIAGNOSIS — M549 Dorsalgia, unspecified: Secondary | ICD-10-CM | POA: Diagnosis not present

## 2015-08-31 DIAGNOSIS — Z0000 Encounter for general adult medical examination without abnormal findings: Secondary | ICD-10-CM

## 2015-08-31 DIAGNOSIS — G8929 Other chronic pain: Secondary | ICD-10-CM | POA: Diagnosis not present

## 2015-08-31 DIAGNOSIS — Z1159 Encounter for screening for other viral diseases: Secondary | ICD-10-CM

## 2015-08-31 MED ORDER — HYDROCODONE-ACETAMINOPHEN 5-325 MG PO TABS: 1.0000 | ORAL_TABLET | Freq: Four times a day (QID) | ORAL | 0 refills | Status: DC | PRN

## 2015-08-31 MED ORDER — VERAPAMIL HCL ER 120 MG PO TBCR: 120.0000 mg | EXTENDED_RELEASE_TABLET | Freq: Every day | ORAL | 0 refills | Status: DC

## 2015-08-31 NOTE — Progress Notes (Signed)
Patient: Devin Parsons, Male    DOB: 03-14-1970, 45 y.o.   MRN: [id number] Visit Date: 08/31/2015  Today's Provider: Lelon Huh, MD   Chief Complaint  Patient presents with  . Annual Exam  . Palpitations    Started last year; but flared around July.     Subjective:    Annual physical exam Devin Parsons is a 45 y.o. male who presents today for health maintenance and complete physical. He feels fairly well. He reports not exercising. He reports he is sleeping poorly.  -----------------------------------------------------------------  Palpitations   This is a recurrent problem. The problem occurs intermittently. The problem has been gradually worsening (Especially in the last month.). Associated symptoms include anxiety, diaphoresis, an irregular heartbeat, malaise/fatigue and shortness of breath. Pertinent negatives include no chest pain.   He was hospitalized last year for syncopal episode associated with SVT. At the time he was consuming large amounts of energy drinks and OTC appetites suppressants which he has since stopped. He does occasionally have a monster drink now, but has episodes of racing even on days he does not consume caffeine. Has had no more syncopal episodes. States he has not drank alcohol in 20 years and does not take any illicit drugs. He does still smoke about a ppd.    Review of Systems  Constitutional: Positive for diaphoresis, fatigue and malaise/fatigue.  Eyes: Negative.   Respiratory: Positive for shortness of breath.   Cardiovascular: Positive for palpitations. Negative for chest pain and leg swelling.  Gastrointestinal: Negative.   Endocrine: Negative.   Genitourinary: Negative.   Musculoskeletal: Positive for neck pain.  Skin: Negative.   Allergic/Immunologic: Negative.   Neurological: Positive for syncope (This happened about a year ago.), light-headedness and headaches.  Hematological: Negative.   Psychiatric/Behavioral: Positive  for sleep disturbance. The patient is nervous/anxious.     Social History      He  reports that he has been smoking.  He has a 31.00 pack-year smoking history. He does not have any smokeless tobacco history on file. He reports that he does not drink alcohol or use drugs.       Social History   Social History  . Marital status: Married    Spouse name: N/A  . Number of children: 1  . Years of education: N/A   Occupational History  . 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 Smoking status: Current Every Day Smoker    Packs/day: 1.00    Years: 31.00  . Smokeless tobacco: None  . Alcohol use No     Comment: formerly a heavy drinker in high school  . Drug use: No  . Sexual activity: Yes   Other Topics Concern  . None   Social History Narrative  . None    Past Medical History:  Diagnosis Date  . History of chicken pox   . SVT (supraventricular tachycardia) ([place])    a. echo 07/2014: EF 50-55%, no RWMA, GR1DD, PASP nl  . Syncope      Patient Active Problem List   Diagnosis Date Noted  . 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 Back pain, chronic 11/19/2014  . Hyperlipidemia, mixed 11/19/2014  . Vestibular dizziness 11/19/2014  . Tobacco abuse   . SVT (supraventricular tachycardia) ([place])   . Syncope, near 08/13/2014  . Episodic mood disorder ([place]) 11/30/2007  . Displacement of cervical intervertebral disc without myelopathy 04/22/2007  Past Surgical History:  Procedure Laterality Date  . FRACTURE SURGERY    . KNEE SURGERY Left    Arthroscopy surgery performed  . MRI OF Lumbar spine  06/14/2004   Results: Herniated disc with facet reaction L5/S1. Done at [facility]  . TONSILLECTOMY AND ADENOIDECTOMY      Family History        Family Status  Relation Status  . Mother Alive  . Father Other  . Son Alive        His family history includes Diabetes in his mother.    No Known  Allergies  Current Meds  Medication Sig  . HYDROcodone-acetaminophen (NORCO/VICODIN) 5-325 MG tablet Take 1 tablet by mouth every 6 (six) hours as needed for moderate pain.  Marland Kitchen ibuprofen (ADVIL,MOTRIN) 200 MG tablet Take 400-600 mg by mouth 2 (two) times daily as needed for headache or mild pain.   . nabumetone (RELAFEN) 500 MG tablet Take 1 tablet (500 mg total) by mouth every 6 (six) hours as needed for mild pain.    Patient Care Team: Birdie Sons, MD as PCP - General (Family Medicine)     Objective:   Vitals: BP (!) 164/110 (BP Location: Right Arm, Patient Position: Sitting, Cuff Size: Normal)   Pulse 76   Temp 98.7 F (37.1 C) (Oral)   Ht 5\' 11"  (1.803 m)   Wt 233 lb (105.7 kg)   BMI 32.50 kg/m    Physical Exam   General Appearance:    Alert, cooperative, no distress, appears stated age  Head:    Normocephalic, without obvious abnormality, atraumatic  Eyes:    PERRL, conjunctiva/corneas clear, EOM's intact, fundi    benign, both eyes       Ears:    Normal TM's and external ear canals, both ears  Nose:   Nares normal, septum midline, mucosa normal, no drainage   or sinus tenderness  Throat:   Lips, mucosa, and tongue normal; teeth and gums normal  Neck:   Supple, symmetrical, trachea midline, no adenopathy;       thyroid:  No enlargement/tenderness/nodules; no carotid   bruit or JVD  Back:     Symmetric, no curvature, ROM normal, no CVA tenderness  Lungs:     Clear to auscultation bilaterally, respirations unlabored  Chest wall:    No tenderness or deformity  Heart:    Regular rate and rhythm, S1 and S2 normal, no murmur, rub   or gallop  Abdomen:     Soft, non-tender, bowel sounds active all four quadrants,    no masses, no organomegaly  Genitalia:    deferred  Rectal:    deferred  Extremities:   Extremities normal, atraumatic, no cyanosis or edema  Pulses:   2+ and symmetric all extremities  Skin:   Skin color, texture, turgor normal, no rashes or lesions   Lymph nodes:   Cervical, supraclavicular, and axillary nodes normal  Neurologic:   CNII-XII intact. Normal strength, sensation and reflexes      throughout    Depression Screen No flowsheet data found.    Assessment & Plan:     Routine Health Maintenance and Physical Exam  Exercise Activities and Dietary recommendations Goals    None      Immunization History  Administered Date(s) Administered  . Td 01/24/2007    Health Maintenance  Topic Date Due  . HIV Screening  01/30/1983  . INFLUENZA VACCINE  08/24/2015  . TETANUS/TDAP  01/23/2017  Discussed health benefits of physical activity, and encouraged him to engage in regular exercise appropriate for his age and condition.    -------------------------------------------------------------------- 1. Annual physical exam  - CBC - Comprehensive metabolic panel - Lipid panel  2. Palpitations Has known history of SVT. Will start on CCB - EKG 12-Lead - T4 AND TSH - Magnesium - verapamil (CALAN-SR) 120 MG CR tablet; Take 1 tablet (120 mg total) by mouth at bedtime.  Dispense: 30 tablet; Refill: 0  3. Chronic back pain Doing well with prn hydrocodone.  - HYDROcodone-acetaminophen (NORCO/VICODIN) 5-325 MG tablet; Take 1 tablet by mouth every 6 (six) hours as needed for moderate pain.  Dispense: 90 tablet; Refill: 0  4. Tobacco abuse Stop smoking   5. Need for hepatitis C screening test  - Hepatitis C antibody    Lelon Huh, MD  [facility]

## 2015-08-31 NOTE — Telephone Encounter (Signed)
error 

## 2015-09-01 LAB — CBC
Hematocrit: 43.4 % (ref 37.5–51.0)
Hemoglobin: 14.2 g/dL (ref 12.6–17.7)
MCH: 29.7 pg (ref 26.6–33.0)
MCHC: 32.7 g/dL (ref 31.5–35.7)
MCV: 91 fL (ref 79–97)
Platelets: 335 10*3/uL (ref 150–379)
RBC: 4.78 x10E6/uL (ref 4.14–5.80)
RDW: 14.2 % (ref 12.3–15.4)
WBC: 9.8 10*3/uL (ref 3.4–10.8)

## 2015-09-01 LAB — T4 AND TSH
T4, Total: 8.6 ug/dL (ref 4.5–12.0)
TSH: 1.84 u[IU]/mL (ref 0.450–4.500)

## 2015-09-01 LAB — COMPREHENSIVE METABOLIC PANEL
A/G RATIO: 1.6 (ref 1.2–2.2)
ALK PHOS: 66 IU/L (ref 39–117)
ALT: 20 IU/L (ref 0–44)
AST: 17 IU/L (ref 0–40)
Albumin: 4.5 g/dL (ref 3.5–5.5)
BUN/Creatinine Ratio: 8 — ABNORMAL LOW (ref 9–20)
BUN: 6 mg/dL (ref 6–24)
Bilirubin Total: 0.2 mg/dL (ref 0.0–1.2)
CO2: 27 mmol/L (ref 18–29)
CREATININE: 0.74 mg/dL — AB (ref 0.76–1.27)
Calcium: 10.4 mg/dL — ABNORMAL HIGH (ref 8.7–10.2)
Chloride: 102 mmol/L (ref 96–106)
GFR calc Af Amer: 127 mL/min/{1.73_m2} (ref >59)
GFR calc non Af Amer: 110 mL/min/{1.73_m2} (ref >59)
Globulin, Total: 2.9 g/dL (ref 1.5–4.5)
Glucose: 87 mg/dL (ref 65–99)
Potassium: 4 mmol/L (ref 3.5–5.2)
SODIUM: 145 mmol/L — AB (ref 134–144)
Total Protein: 7.4 g/dL (ref 6.0–8.5)

## 2015-09-01 LAB — LIPID PANEL
CHOL/HDL RATIO: 6.5 ratio — AB (ref 0.0–5.0)
CHOLESTEROL TOTAL: 208 mg/dL — AB (ref 100–199)
HDL: 32 mg/dL — ABNORMAL LOW (ref >39)
LDL Calculated: 129 mg/dL — ABNORMAL HIGH (ref 0–99)
Triglycerides: 237 mg/dL — ABNORMAL HIGH (ref 0–149)
VLDL Cholesterol Cal: 47 mg/dL — ABNORMAL HIGH (ref 5–40)

## 2015-09-01 LAB — HEPATITIS C ANTIBODY: Hep C Virus Ab: <0.1 s/co ratio (ref 0.0–0.9)

## 2015-09-01 LAB — MAGNESIUM: Magnesium: 2.3 mg/dL (ref 1.6–2.3)

## 2015-09-21 ENCOUNTER — Encounter: Payer: Self-pay | Admitting: Family Medicine

## 2015-09-21 ENCOUNTER — Ambulatory Visit (INDEPENDENT_AMBULATORY_CARE_PROVIDER_SITE_OTHER): Payer: BLUE CROSS/BLUE SHIELD | Admitting: Family Medicine

## 2015-09-21 VITALS — BP 150/100 | HR 71 | Temp 98.8°F | Resp 16 | Wt 236.0 lb

## 2015-09-21 DIAGNOSIS — R002 Palpitations: Secondary | ICD-10-CM | POA: Diagnosis not present

## 2015-09-21 DIAGNOSIS — I10 Essential (primary) hypertension: Secondary | ICD-10-CM

## 2015-09-21 MED ORDER — VERAPAMIL HCL ER 120 MG PO TBCR: 120.0000 mg | EXTENDED_RELEASE_TABLET | Freq: Every day | ORAL | 3 refills | Status: DC

## 2015-09-21 NOTE — Progress Notes (Signed)
Patient: Devin Parsons Male    DOB: [birth year]   45 y.o.   MRN: [id number] Visit Date: 09/21/2015  Today's Provider: Lelon Huh, MD   Chief Complaint  Patient presents with  . Palpitations    3 week follow up   Subjective:    HPI Follow up Palpitations: Patient was last seen for this problem 3 weeks ago. Management during that visit includes starting patient on Verapamil. Patient come in today reporting good compliance with treatment  and good tolerance.  Patient states after starting Verapamil he continued to have palpitations up until 2-3 days ago. Patient denies any chest pain. States he has been more fatigued since starting medications, but it is improving.     No Known Allergies Current Meds  Medication Sig  . HYDROcodone-acetaminophen (NORCO/VICODIN) 5-325 MG tablet Take 1 tablet by mouth every 6 (six) hours as needed for moderate pain.  Marland Kitchen ibuprofen (ADVIL,MOTRIN) 200 MG tablet Take 400-600 mg by mouth 2 (two) times daily as needed for headache or mild pain.   . nabumetone (RELAFEN) 500 MG tablet Take 1 tablet (500 mg total) by mouth every 6 (six) hours as needed for mild pain.  . verapamil (CALAN-SR) 120 MG CR tablet Take 1 tablet (120 mg total) by mouth at bedtime.    Review of Systems  Constitutional: Negative for appetite change, chills and fever.  Respiratory: Negative for chest tightness, shortness of breath and wheezing.   Cardiovascular: Negative for chest pain and palpitations.  Gastrointestinal: Negative for abdominal pain, nausea and vomiting.  Neurological: Positive for headaches. Negative for dizziness and light-headedness.    Social History  Substance Use Topics  . Smoking status: Current Every Day Smoker    Packs/day: 1.00    Years: 31.00  . Smokeless tobacco: Not on file  . Alcohol use No     Comment: formerly a heavy drinker in high school   Objective:   BP (!) 150/100 (BP Location: Right Arm, Patient Position: Sitting, Cuff Size: Large)    Pulse 71   Temp 98.8 F (37.1 C) (Oral)   Resp 16   Wt 236 lb (107 kg)   SpO2 97% Comment: room air  BMI 32.92 kg/m   Physical Exam  General appearance: alert, well developed, well nourished, cooperative and in no distress Head: Normocephalic, without obvious abnormality, atraumatic Respiratory: Respirations even and unlabored, normal respiratory rate Extremities: No gross deformities Skin: Skin color, texture, turgor normal. No rashes seen  Psych: Appropriate mood and affect. Neurologic: Mental status: Alert, oriented to person, place, and time, thought content appropriate.  EKG : NSR    Assessment & Plan:     1. Palpitations Improved on verapamil. Continue current medications.   - EKG 12-Lead - verapamil (CALAN-SR) 120 MG CR tablet; Take 1 tablet (120 mg total) by mouth at bedtime.  Dispense: 30 tablet; Refill: 3  2. Essential hypertension Improved, but not to goal. Is feeling more fatigued on varapamil 120. Will continue same dose for now and he will work on reducing sodium in diet. Follow up 3 months. Refill verapamil  SR 120mg  #30 rf x 3.       The entirety of the information documented in the History of Present Illness, Review of Systems and Physical Exam were personally obtained by me. Portions of this information were initially documented by Meyer Cory, CMA and reviewed by me for thoroughness and accuracy.    Lelon Huh, MD  [facility]

## 2015-09-29 ENCOUNTER — Other Ambulatory Visit: Payer: Self-pay | Admitting: Family Medicine

## 2015-09-29 DIAGNOSIS — M549 Dorsalgia, unspecified: Principal | ICD-10-CM

## 2015-09-29 DIAGNOSIS — G8929 Other chronic pain: Secondary | ICD-10-CM

## 2015-09-29 MED ORDER — HYDROCODONE-ACETAMINOPHEN 5-325 MG PO TABS: 1.0000 | ORAL_TABLET | Freq: Four times a day (QID) | ORAL | 0 refills | Status: DC | PRN

## 2015-09-29 NOTE — Telephone Encounter (Signed)
Pt needs refill on his hydrocodone.  Please call when ready.  Thanks Con Memos

## 2015-10-28 ENCOUNTER — Telehealth: Payer: Self-pay | Admitting: Family Medicine

## 2015-10-28 DIAGNOSIS — G8929 Other chronic pain: Secondary | ICD-10-CM

## 2015-10-28 DIAGNOSIS — M549 Dorsalgia, unspecified: Principal | ICD-10-CM

## 2015-10-28 MED ORDER — HYDROCODONE-ACETAMINOPHEN 5-325 MG PO TABS: 1.0000 | ORAL_TABLET | Freq: Four times a day (QID) | ORAL | 0 refills | Status: DC | PRN

## 2015-10-28 NOTE — Telephone Encounter (Signed)
Pt contacted office for refill request on the following medications: HYDROcodone-acetaminophen (NORCO/VICODIN) 5-325 MG tablet Last written: 09/29/15 Last OV: 09/21/15 Please advise. Thanks TNP

## 2015-11-04 DIAGNOSIS — Z23 Encounter for immunization: Secondary | ICD-10-CM | POA: Diagnosis not present

## 2015-11-29 ENCOUNTER — Other Ambulatory Visit: Payer: Self-pay | Admitting: Family Medicine

## 2015-11-29 DIAGNOSIS — G8929 Other chronic pain: Secondary | ICD-10-CM

## 2015-11-29 DIAGNOSIS — M549 Dorsalgia, unspecified: Principal | ICD-10-CM

## 2015-11-29 MED ORDER — HYDROCODONE-ACETAMINOPHEN 5-325 MG PO TABS: 1.0000 | ORAL_TABLET | Freq: Four times a day (QID) | ORAL | 0 refills | Status: DC | PRN

## 2015-11-29 NOTE — Telephone Encounter (Signed)
Pt contacted office for refill request on the following medications: HYDROcodone-acetaminophen (NORCO/VICODIN) 5-325 MG tablet  Last Written: 10/28/15 Last OV: 09/21/15 Next OV: 12/22/15 Please advise. Thanks TNP

## 2015-12-22 ENCOUNTER — Encounter: Payer: Self-pay | Admitting: Family Medicine

## 2015-12-22 ENCOUNTER — Ambulatory Visit (INDEPENDENT_AMBULATORY_CARE_PROVIDER_SITE_OTHER): Payer: BLUE CROSS/BLUE SHIELD | Admitting: Family Medicine

## 2015-12-22 VITALS — BP 144/100 | HR 83 | Temp 98.3°F | Resp 16 | Ht 71.0 in | Wt 247.0 lb

## 2015-12-22 DIAGNOSIS — R002 Palpitations: Secondary | ICD-10-CM

## 2015-12-22 DIAGNOSIS — I10 Essential (primary) hypertension: Secondary | ICD-10-CM | POA: Diagnosis not present

## 2015-12-22 DIAGNOSIS — I471 Supraventricular tachycardia: Secondary | ICD-10-CM

## 2015-12-22 DIAGNOSIS — M549 Dorsalgia, unspecified: Secondary | ICD-10-CM

## 2015-12-22 DIAGNOSIS — G8929 Other chronic pain: Secondary | ICD-10-CM | POA: Diagnosis not present

## 2015-12-22 MED ORDER — VERAPAMIL HCL ER 120 MG PO TBCR: 120.0000 mg | EXTENDED_RELEASE_TABLET | Freq: Every day | ORAL | 5 refills | Status: DC

## 2015-12-22 MED ORDER — CANDESARTAN CILEXETIL 8 MG PO TABS: 8.0000 mg | ORAL_TABLET | Freq: Every day | ORAL | 3 refills | Status: DC

## 2015-12-22 MED ORDER — HYDROCODONE-ACETAMINOPHEN 5-325 MG PO TABS: 1.0000 | ORAL_TABLET | Freq: Four times a day (QID) | ORAL | 0 refills | Status: DC | PRN

## 2015-12-22 NOTE — Progress Notes (Signed)
Patient: Devin Parsons Male    DOB: 07/27/1970   45 y.o.   MRN: [id number] Visit Date: 12/22/2015  Today's Provider: Lelon Huh, MD   Chief Complaint  Patient presents with  . Follow-up  . Hypertension  . Back Pain   Subjective:    HPI    Hypertension, follow-up:  BP Readings from Last 3 Encounters:  12/22/15 (!) 150/90  09/21/15 (!) 150/100  08/31/15 (!) 164/110    He was last seen for hypertension 3 months ago.  BP at that visit was 150/100. Management since that visit includes   .He reports good compliance with treatment. He is not having side effects. none He is not exercising. He is adherent to low salt diet.   Outside blood pressures are n/a. He is experiencing none.  Patient denies none.   Cardiovascular risk factors include none.  Use of agents associated with hypertension: none.   He has not had any episodes of racing heart on current dose of verapamil. Is avoid salty foods in diet.  ------------------------------------------------------------------   Chronic back pain Doing well with current regiment of hydrocodone/apap  No Known Allergies   Current Outpatient Prescriptions:  .  HYDROcodone-acetaminophen (NORCO/VICODIN) 5-325 MG tablet, Take 1 tablet by mouth every 6 (six) hours as needed for moderate pain., Disp: 90 tablet, Rfl: 0 .  ibuprofen (ADVIL,MOTRIN) 200 MG tablet, Take 400-600 mg by mouth 2 (two) times daily as needed for headache or mild pain. , Disp: , Rfl:  .  nabumetone (RELAFEN) 500 MG tablet, TAKE ONE TABLET BY MOUTH EVERY 6 HOURS AS NEEDED FOR MILD PAIN, Disp: 60 tablet, Rfl: 12 .  verapamil (CALAN-SR) 120 MG CR tablet, Take 1 tablet (120 mg total) by mouth at bedtime., Disp: 30 tablet, Rfl: 3  Review of Systems  Constitutional: Negative for appetite change, chills and fever.  Respiratory: Negative for chest tightness, shortness of breath and wheezing.   Cardiovascular: Negative for chest pain and palpitations.    Gastrointestinal: Negative for abdominal pain, nausea and vomiting.    Social History  Substance Use Topics  . Smoking status: Current Every Day Smoker    Packs/day: 0.75    Years: 31.00  . Smokeless tobacco: Never Used  . Alcohol use No     Comment: formerly a heavy drinker in high school   Objective:   BP (!) 150/90 (BP Location: Right Arm, Patient Position: Sitting, Cuff Size: Large)   Pulse 83   Temp 98.3 F (36.8 C) (Oral)   Resp 16   Ht 5\' 11"  (1.803 m)   Wt 247 lb (112 kg)   SpO2 94%   BMI 34.45 kg/m   Physical Exam   General Appearance:    Alert, cooperative, no distress  Eyes:    PERRL, conjunctiva/corneas clear, EOM's intact       Lungs:     Clear to auscultation bilaterally, respirations unlabored  Heart:    Regular rate and rhythm  Neurologic:   Awake, alert, oriented x 3. No apparent focal neurological           defect.           Assessment & Plan:     1. Palpitations Well controlled on current dose of verapamil - verapamil (CALAN-SR) 120 MG CR tablet; Take 1 tablet (120 mg total) by mouth at bedtime.  Dispense: 30 tablet; Refill: 5  2. Chronic back pain, unspecified back location, unspecified back pain laterality Continue current medications.   -  HYDROcodone-acetaminophen (NORCO/VICODIN) 5-325 MG tablet; Take 1 tablet by mouth every 6 (six) hours as needed for moderate pain.  Dispense: 90 tablet; Refill: 0  3. SVT (supraventricular tachycardia) ([place])   4. Essential hypertension Add ARB. Follow up 6 weeks.  - candesartan (ATACAND) 8 MG tablet; Take 1 tablet (8 mg total) by mouth daily.  Dispense: 30 tablet; Refill: 3       Lelon Huh, MD  [facility]

## 2016-01-20 ENCOUNTER — Telehealth: Payer: Self-pay | Admitting: Family Medicine

## 2016-01-20 DIAGNOSIS — M549 Dorsalgia, unspecified: Principal | ICD-10-CM

## 2016-01-20 DIAGNOSIS — G8929 Other chronic pain: Secondary | ICD-10-CM

## 2016-01-20 NOTE — Telephone Encounter (Signed)
12/22/15 last ov. Last filled 12/22/15. Please review. Thank you. sd

## 2016-01-20 NOTE — Telephone Encounter (Signed)
Pt contacted office for refill request on the following medications:  HYDROcodone-acetaminophen (NORCO/VICODIN) 5-325 MG tablet.  [id number]

## 2016-01-21 ENCOUNTER — Other Ambulatory Visit: Payer: Self-pay | Admitting: Family Medicine

## 2016-01-21 MED ORDER — HYDROCODONE-ACETAMINOPHEN 5-325 MG PO TABS: 1.0000 | ORAL_TABLET | Freq: Four times a day (QID) | ORAL | 0 refills | Status: DC | PRN

## 2016-01-21 NOTE — Telephone Encounter (Signed)
Patient requesting refill on verapamil (CALAN-SR) 120 MG CR tablet   Call into [facility]

## 2016-01-21 NOTE — Telephone Encounter (Signed)
Prescription was on hold at [facility] will fill today. sd

## 2016-02-15 ENCOUNTER — Ambulatory Visit (INDEPENDENT_AMBULATORY_CARE_PROVIDER_SITE_OTHER): Payer: BLUE CROSS/BLUE SHIELD | Admitting: Family Medicine

## 2016-02-15 ENCOUNTER — Encounter: Payer: Self-pay | Admitting: Family Medicine

## 2016-02-15 VITALS — BP 160/106 | HR 94 | Temp 98.3°F | Resp 16 | Ht 71.0 in | Wt 243.0 lb

## 2016-02-15 DIAGNOSIS — R002 Palpitations: Secondary | ICD-10-CM

## 2016-02-15 DIAGNOSIS — I10 Essential (primary) hypertension: Secondary | ICD-10-CM

## 2016-02-15 DIAGNOSIS — M549 Dorsalgia, unspecified: Secondary | ICD-10-CM

## 2016-02-15 DIAGNOSIS — G8929 Other chronic pain: Secondary | ICD-10-CM | POA: Diagnosis not present

## 2016-02-15 MED ORDER — VERAPAMIL HCL ER 180 MG PO TBCR: 180.0000 mg | EXTENDED_RELEASE_TABLET | Freq: Every day | ORAL | 3 refills | Status: DC

## 2016-02-15 MED ORDER — HYDROCODONE-ACETAMINOPHEN 7.5-325 MG PO TABS: 1.0000 | ORAL_TABLET | Freq: Four times a day (QID) | ORAL | 0 refills | Status: DC | PRN

## 2016-02-15 NOTE — Progress Notes (Signed)
Patient: Devin Parsons Male    DOB: 03/24/1970   46 y.o.   MRN: [id number] Visit Date: 02/15/2016  Today's Provider: Lelon Huh, MD   Chief Complaint  Patient presents with  . Follow-up  . Hypertension  . Back Pain  . Palpitations   Subjective:    HPI  Chronic back pain, unspecified back location, unspecified back pain laterality From 12/22/2015-no changes. Continued same medications.states pain has been worse the last month or two with colder weather and has been stressed about mother who is living in [place] and has been ill.   Palpitations From 12/22/2015-no changes. Well controlled on current dose of verapamil.    Hypertension, follow-up:  BP Readings from Last 3 Encounters:  02/15/16 (!) 138/100  12/22/15 (!) 144/100  09/21/15 (!) 150/100    He was last seen for hypertension 6 weeks ago.  BP at that visit was 150/90. Management since that visit includes;Adding candesartan. States he ran out about a month ago, and felt better after stopping it.  He is not having side effects. none He is exercising. He is adherent to low salt diet.   Outside blood pressures are not checking. He is experiencing none.  Patient denies none.   Cardiovascular risk factors include none.  Use of agents associated with hypertension: none.   ----------------------------------------------------------------    No Known Allergies   Current Outpatient Prescriptions:  .  HYDROcodone-acetaminophen (NORCO/VICODIN) 5-325 MG tablet, Take 1 tablet by mouth every 6 (six) hours as needed for moderate pain., Disp: 90 tablet, Rfl: 0 .  ibuprofen (ADVIL,MOTRIN) 200 MG tablet, Take 400-600 mg by mouth 2 (two) times daily as needed for headache or mild pain. , Disp: , Rfl:  .  nabumetone (RELAFEN) 500 MG tablet, TAKE ONE TABLET BY MOUTH EVERY 6 HOURS AS NEEDED FOR MILD PAIN, Disp: 60 tablet, Rfl: 12 .  verapamil (CALAN-SR) 120 MG CR tablet, Take 1 tablet (120 mg total) by mouth at  bedtime., Disp: 30 tablet, Rfl: 5 .  candesartan (ATACAND) 8 MG tablet, Take 1 tablet (8 mg total) by mouth daily. (Patient not taking: Reported on 02/15/2016), Disp: 30 tablet, Rfl: 3  Review of Systems  Constitutional: Negative for appetite change, chills and fever.  Respiratory: Negative for chest tightness, shortness of breath and wheezing.   Cardiovascular: Negative for chest pain and palpitations.  Gastrointestinal: Negative for abdominal pain, nausea and vomiting.    Social History  Substance Use Topics  . Smoking status: Current Every Day Smoker    Packs/day: 0.75    Years: 31.00  . Smokeless tobacco: Never Used  . Alcohol use No     Comment: formerly a heavy drinker in high school   Objective:     Vitals:   02/15/16 1600 02/15/16 1632  BP: (!) 138/100 (!) 160/106  Pulse: 94   Resp: 16   Temp: 98.3 F (36.8 C)   TempSrc: Oral   SpO2: 96%   Weight: 243 lb (110.2 kg)   Height: 5\' 11"  (1.803 m)      Physical Exam   General Appearance:    Alert, cooperative, no distress  Eyes:    PERRL, conjunctiva/corneas clear, EOM's intact       Lungs:     Clear to auscultation bilaterally, respirations unlabored  Heart:    Regular rate and rhythm  Neurologic:   Awake, alert, oriented x 3. No apparent focal neurological           defect.  Assessment & Plan:     1. Essential hypertension Did not tolerate addition of 8mg  candesartan. Discussed options of trying a lower dose of candesartan, or increasing verapamil. He would rather increase verapamil since he has been tolerating it well for some time - verapamil (CALAN-SR) 180 MG CR tablet; Take 1 tablet (180 mg total) by mouth at bedtime.  Dispense: 30 tablet; Refill: 3   2. Chronic back pain, unspecified back location, unspecified back pain laterality Worse with cold weather, will increase hydrocodone/apap from 5 to 7.5mg  for a month or two and re-evaluate at next visit.   3. Palpitations Well controlled on  CCB  Return in about 3 months (around 05/15/2016).      The entirety of the information documented in the History of Present Illness, Review of Systems and Physical Exam were personally obtained by me. Portions of this information were initially documented by April M. Sabra Heck, CMA and reviewed by me for thoroughness and accuracy.    Lelon Huh, MD  [facility]

## 2016-03-01 ENCOUNTER — Encounter: Payer: Self-pay | Admitting: Physician Assistant

## 2016-03-01 ENCOUNTER — Ambulatory Visit (INDEPENDENT_AMBULATORY_CARE_PROVIDER_SITE_OTHER): Payer: BLUE CROSS/BLUE SHIELD | Admitting: Physician Assistant

## 2016-03-01 VITALS — BP 168/102 | HR 92 | Temp 98.7°F | Resp 16 | Wt 239.0 lb

## 2016-03-01 DIAGNOSIS — J069 Acute upper respiratory infection, unspecified: Secondary | ICD-10-CM

## 2016-03-01 MED ORDER — HYDROCODONE-HOMATROPINE 5-1.5 MG/5ML PO SYRP: 5.0000 mL | ORAL_SOLUTION | Freq: Three times a day (TID) | ORAL | 0 refills | Status: DC | PRN

## 2016-03-01 NOTE — Patient Instructions (Signed)
Upper Respiratory Infection, Adult Most upper respiratory infections (URIs) are caused by a virus. A URI affects the nose, throat, and upper air passages. The most common type of URI is often called "the common cold." Follow these instructions at home:  Take medicines only as told by your doctor.  Gargle warm saltwater or take cough drops to comfort your throat as told by your doctor.  Use a warm mist humidifier or inhale steam from a shower to increase air moisture. This may make it easier to breathe.  Drink enough fluid to keep your pee (urine) clear or pale yellow.  Eat soups and other clear broths.  Have a healthy diet.  Rest as needed.  Go back to work when your fever is gone or your doctor says it is okay.  You may need to stay home longer to avoid giving your URI to others.  You can also wear a face mask and wash your hands often to prevent spread of the virus.  Use your inhaler more if you have asthma.  Do not use any tobacco products, including cigarettes, chewing tobacco, or electronic cigarettes. If you need help quitting, ask your doctor. Contact a doctor if:  You are getting worse, not better.  Your symptoms are not helped by medicine.  You have chills.  You are getting more short of breath.  You have brown or red mucus.  You have yellow or brown discharge from your nose.  You have pain in your face, especially when you bend forward.  You have a fever.  You have puffy (swollen) neck glands.  You have pain while swallowing.  You have white areas in the back of your throat. Get help right away if:  You have very bad or constant:  Headache.  Ear pain.  Pain in your forehead, behind your eyes, and over your cheekbones (sinus pain).  Chest pain.  You have long-lasting (chronic) lung disease and any of the following:  Wheezing.  Long-lasting cough.  Coughing up blood.  A change in your usual mucus.  You have a stiff neck.  You have  changes in your:  Vision.  Hearing.  Thinking.  Mood. This information is not intended to replace advice given to you by your health care provider. Make sure you discuss any questions you have with your health care provider. Document Released: 06/28/2007 Document Revised: 09/12/2015 Document Reviewed: 04/16/2013 Elsevier Interactive Patient Education  2017 Elsevier Inc.  

## 2016-03-01 NOTE — Progress Notes (Signed)
[facility]  Chief Complaint  Patient presents with  . URI    Started about two weeks ago.     Subjective:    Patient ID: Devin Parsons, male    DOB: 10-17-1970, 46 y.o.   MRN: [id number]  Upper Respiratory Infection: Devin Parsons is a 46 y.o. male with a past medical history significant for 30 pack year smoking history complaining of symptoms of a URI, possible sinusitis. Symptoms include congestion, cough and sore throat. Onset of symptoms was 2 weeks ago, gradually worsening since that time. He also c/o achiness, congestion and nasal congestion for the past 2 weeks .  He is drinking plenty of fluids. Evaluation to date: none. Treatment to date: cough suppressants and decongestants. The treatment has provided no relief. No fevers, chills, nausea or vomiting.   Review of Systems  Constitutional: Positive for fatigue. Negative for activity change, appetite change, chills and diaphoresis.  HENT: Positive for congestion, sinus pain, sinus pressure, sore throat and voice change. Negative for ear discharge, ear pain, nosebleeds, postnasal drip, rhinorrhea, sneezing, tinnitus and trouble swallowing.   Eyes: Negative.   Respiratory: Positive for cough, chest tightness, shortness of breath and wheezing. Negative for apnea, choking and stridor.   Gastrointestinal: Negative.   Musculoskeletal: Positive for myalgias.  Neurological: Positive for light-headedness and headaches. Negative for dizziness.       Objective:   BP (!) 168/102 (BP Location: Right Arm, Patient Position: Sitting, Cuff Size: Normal)   Pulse 92   Temp 98.7 F (37.1 C) (Oral)   Resp 16   Wt 239 lb (108.4 kg)   SpO2 98%   BMI 33.33 kg/m   Patient Active Problem List   Diagnosis Date Noted  . Hypertension 09/21/2015  . 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 Back pain, chronic 11/19/2014  . Hyperlipidemia, mixed 11/19/2014  . Vestibular dizziness 11/19/2014  . Tobacco abuse   .  SVT (supraventricular tachycardia) ([place])   . Syncope, near 08/13/2014  . Episodic mood disorder ([place]) 11/30/2007  . Displacement of cervical intervertebral disc without myelopathy 04/22/2007    Outpatient Encounter Prescriptions as of 03/01/2016  Medication Sig  . HYDROcodone-acetaminophen (NORCO) 7.5-325 MG tablet Take 1 tablet by mouth every 6 (six) hours as needed for moderate pain.  Marland Kitchen ibuprofen (ADVIL,MOTRIN) 200 MG tablet Take 400-600 mg by mouth 2 (two) times daily as needed for headache or mild pain.   . nabumetone (RELAFEN) 500 MG tablet TAKE ONE TABLET BY MOUTH EVERY 6 HOURS AS NEEDED FOR MILD PAIN  . verapamil (CALAN-SR) 180 MG CR tablet Take 1 tablet (180 mg total) by mouth at bedtime.  Marland Kitchen HYDROcodone-homatropine (HYCODAN) 5-1.5 MG/5ML syrup Take 5 mLs by mouth every 8 (eight) hours as needed for cough.   No facility-administered encounter medications on file as of 03/01/2016.     No Known Allergies     Physical Exam  Constitutional: He appears well-developed and well-nourished.  HENT:  Right Ear: Tympanic membrane normal.  Left Ear: Tympanic membrane normal.  Nose: Right sinus exhibits no maxillary sinus tenderness and no frontal sinus tenderness. Left sinus exhibits no maxillary sinus tenderness and no frontal sinus tenderness.  Mouth/Throat: Oropharynx is clear and moist. No oropharyngeal exudate.  Neck: Neck supple.  Cardiovascular: Normal rate.   Pulmonary/Chest: Effort normal and breath sounds normal. No respiratory distress. He has no wheezes. He has no rales.  Lymphadenopathy:    He has no cervical adenopathy.  Skin: Skin is warm and dry.  Psychiatric: He has a normal mood and affect. His behavior is normal.       Assessment & Plan:   1. Upper respiratory infection with cough and congestion  Afebrile, no SOB, no adverse lung sounds on exam. Do think patient has some PND contributing to cough, can start 2nd gen antihistamine. Will give cough medication. Pt can  call back if not improving.  - HYDROcodone-homatropine (HYCODAN) 5-1.5 MG/5ML syrup; Take 5 mLs by mouth every 8 (eight) hours as needed for cough.  Dispense: 120 mL; Refill: 0  Return if symptoms worsen or fail to improve.   Patient Instructions  Upper Respiratory Infection, Adult Most upper respiratory infections (URIs) are caused by a virus. A URI affects the nose, throat, and upper air passages. The most common type of URI is often called "the common cold." Follow these instructions at home:  Take medicines only as told by your doctor.  Gargle warm saltwater or take cough drops to comfort your throat as told by your doctor.  Use a warm mist humidifier or inhale steam from a shower to increase air moisture. This may make it easier to breathe.  Drink enough fluid to keep your pee (urine) clear or pale yellow.  Eat soups and other clear broths.  Have a healthy diet.  Rest as needed.  Go back to work when your fever is gone or your doctor says it is okay.  You may need to stay home longer to avoid giving your URI to others.  You can also wear a face mask and wash your hands often to prevent spread of the virus.  Use your inhaler more if you have asthma.  Do not use any tobacco products, including cigarettes, chewing tobacco, or electronic cigarettes. If you need help quitting, ask your doctor. Contact a doctor if:  You are getting worse, not better.  Your symptoms are not helped by medicine.  You have chills.  You are getting more short of breath.  You have brown or red mucus.  You have yellow or brown discharge from your nose.  You have pain in your face, especially when you bend forward.  You have a fever.  You have puffy (swollen) neck glands.  You have pain while swallowing.  You have white areas in the back of your throat. Get help right away if:  You have very bad or constant:  Headache.  Ear pain.  Pain in your forehead, behind your eyes, and  over your cheekbones (sinus pain).  Chest pain.  You have long-lasting (chronic) lung disease and any of the following:  Wheezing.  Long-lasting cough.  Coughing up blood.  A change in your usual mucus.  You have a stiff neck.  You have changes in your:  Vision.  Hearing.  Thinking.  Mood. This information is not intended to replace advice given to you by your health care provider. Make sure you discuss any questions you have with your health care provider. Document Released: 06/28/2007 Document Revised: 09/12/2015 Document Reviewed: 04/16/2013 Elsevier Interactive Patient Education  2017 [employer].     The entirety of the information documented in the History of Present Illness, Review of Systems and Physical Exam were personally obtained by me. Portions of this information were initially documented by Ashley Royalty, CMA and reviewed by me for thoroughness and accuracy.

## 2016-03-13 ENCOUNTER — Other Ambulatory Visit: Payer: Self-pay | Admitting: *Deleted

## 2016-03-13 DIAGNOSIS — G8929 Other chronic pain: Secondary | ICD-10-CM

## 2016-03-13 DIAGNOSIS — M549 Dorsalgia, unspecified: Principal | ICD-10-CM

## 2016-03-13 MED ORDER — HYDROCODONE-ACETAMINOPHEN 7.5-325 MG PO TABS: 1.0000 | ORAL_TABLET | Freq: Four times a day (QID) | ORAL | 0 refills | Status: DC | PRN

## 2016-04-10 ENCOUNTER — Other Ambulatory Visit: Payer: Self-pay | Admitting: Family Medicine

## 2016-04-10 DIAGNOSIS — Z720 Tobacco use: Secondary | ICD-10-CM

## 2016-04-10 DIAGNOSIS — G8929 Other chronic pain: Secondary | ICD-10-CM

## 2016-04-10 DIAGNOSIS — M549 Dorsalgia, unspecified: Secondary | ICD-10-CM

## 2016-04-10 MED ORDER — BUPROPION HCL ER (SR) 150 MG PO TB12: ORAL_TABLET | ORAL | 6 refills | Status: DC

## 2016-04-10 MED ORDER — HYDROCODONE-ACETAMINOPHEN 7.5-325 MG PO TABS: 1.0000 | ORAL_TABLET | Freq: Four times a day (QID) | ORAL | 0 refills | Status: DC | PRN

## 2016-04-10 MED ORDER — NABUMETONE 500 MG PO TABS: ORAL_TABLET | ORAL | 12 refills | Status: DC

## 2016-04-10 NOTE — Telephone Encounter (Signed)
Pt requesting refill of HYDROcodone-acetaminophen (NORCO) 7.5-325 MG tablet Pt states he is currently out.  States he put in request in Thursday.    nabumetone (RELAFEN) 500 MG tablet  Sent to [facility]  Pt also requesting Wellbutrin (unsure of MG)  States we put him on this before but he stopped taking.  He is wishing to start taking again.

## 2016-04-14 ENCOUNTER — Other Ambulatory Visit: Payer: Self-pay | Admitting: Family Medicine

## 2016-04-20 ENCOUNTER — Other Ambulatory Visit: Payer: Self-pay | Admitting: Family Medicine

## 2016-04-20 MED ORDER — MELOXICAM 15 MG PO TABS: 15.0000 mg | ORAL_TABLET | Freq: Every day | ORAL | 2 refills | Status: DC

## 2016-04-24 ENCOUNTER — Other Ambulatory Visit: Payer: Self-pay | Admitting: Family Medicine

## 2016-05-11 DIAGNOSIS — R112 Nausea with vomiting, unspecified: Secondary | ICD-10-CM | POA: Diagnosis not present

## 2016-05-11 DIAGNOSIS — R1084 Generalized abdominal pain: Secondary | ICD-10-CM | POA: Diagnosis not present

## 2016-05-17 ENCOUNTER — Ambulatory Visit (INDEPENDENT_AMBULATORY_CARE_PROVIDER_SITE_OTHER): Payer: BLUE CROSS/BLUE SHIELD | Admitting: Family Medicine

## 2016-05-17 ENCOUNTER — Encounter: Payer: Self-pay | Admitting: Family Medicine

## 2016-05-17 VITALS — BP 152/110 | HR 89 | Temp 97.5°F | Resp 18 | Wt 235.0 lb

## 2016-05-17 DIAGNOSIS — R002 Palpitations: Secondary | ICD-10-CM

## 2016-05-17 DIAGNOSIS — M5020 Other cervical disc displacement, unspecified cervical region: Secondary | ICD-10-CM

## 2016-05-17 DIAGNOSIS — I471 Supraventricular tachycardia: Secondary | ICD-10-CM

## 2016-05-17 DIAGNOSIS — I10 Essential (primary) hypertension: Secondary | ICD-10-CM

## 2016-05-17 MED ORDER — HYDROCODONE-ACETAMINOPHEN 5-325 MG PO TABS: 1.0000 | ORAL_TABLET | Freq: Four times a day (QID) | ORAL | 0 refills | Status: DC | PRN

## 2016-05-17 MED ORDER — MELOXICAM 15 MG PO TABS: 15.0000 mg | ORAL_TABLET | Freq: Every day | ORAL | 2 refills | Status: DC

## 2016-05-17 MED ORDER — VERAPAMIL HCL ER 240 MG PO TBCR: 240.0000 mg | EXTENDED_RELEASE_TABLET | Freq: Every day | ORAL | 3 refills | Status: DC

## 2016-05-17 NOTE — Progress Notes (Signed)
Patient: Devin Parsons    DOB: 10/01/1970   46 y.o.   MRN: [id number] Visit Date: 05/17/2016  Today's Provider: Lelon Huh, MD   Chief Complaint  Patient presents with  . Follow-up   Subjective:    HPI    Hypertension, follow-up:  BP Readings from Last 3 Encounters:  03/01/16 (!) 168/102  02/15/16 (!) 160/106  12/22/15 (!) 144/100    He was last seen for hypertension 3 months ago.  BP at that visit was 160/106. Management since that visit includesincreased verapamil to 180 mg qd.He reports good compliance with treatment.  He is not exercising. He is adherent to low salt diet.   Outside blood pressures are not being checked. He is experiencing palpitations.  Patient denies chest pain, chest pressure/discomfort, claudication, dyspnea, exertional chest pressure/discomfort, fatigue, irregular heart beat, lower extremity edema, near-syncope, orthopnea, paroxysmal nocturnal dyspnea, syncope and tachypnea.   Cardiovascular risk factors include hypertension and Parsons gender.  Use of agents associated with hypertension: none.   He states he is still having episodes of tachycardia but not as frequent and not lasting as long. Having episodes a few times a week with DR into 130s.   ----------------------------------------------------------------    Chronic back pain, unspecified back location, unspecified back pain laterality 02/15/2015-increased hydrocodone/apap from 5 to 7.5mg  for a month or two and re-evaluate at next visit.  Patient reports good compliance with treatment, and fair symptom control. He feels like he is doing well and would like to go back to 5/325 mg.   No Known Allergies   Current Outpatient Prescriptions:  .  buPROPion (WELLBUTRIN SR) 150 MG 12 hr tablet, 1 tablet daily for 3 days, then 1 tablet twice daily. Stop smoking 14 days after starting medication, Disp: 60 tablet, Rfl: 6 .  HYDROcodone-acetaminophen (NORCO) 7.5-325 MG tablet, Take 1  tablet by mouth every 6 (six) hours as needed for moderate pain., Disp: 30 tablet, Rfl: 0 .  ibuprofen (ADVIL,MOTRIN) 200 MG tablet, Take 400-600 mg by mouth 2 (two) times daily as needed for headache or mild pain. , Disp: , Rfl:  .  verapamil (CALAN-SR) 180 MG CR tablet, Take 1 tablet (180 mg total) by mouth at bedtime., Disp: 30 tablet, Rfl: 3  Review of Systems  Constitutional: Negative for appetite change, chills and fever.  Respiratory: Negative for chest tightness, shortness of breath and wheezing.   Cardiovascular: Positive for palpitations. Negative for chest pain.  Gastrointestinal: Negative for abdominal pain, nausea and vomiting.  Musculoskeletal: Positive for arthralgias and back pain.    Social History  Substance Use Topics  . Smoking status: Current Every Day Smoker    Packs/day: 0.50    Years: 31.00  . Smokeless tobacco: Never Used  . Alcohol use No     Comment: formerly a heavy drinker in high school   Objective:   BP (!) 152/110 (BP Location: Left Arm, Patient Position: Sitting, Cuff Size: Large)   Pulse 89   Temp 97.5 F (36.4 C) (Oral)   Resp 18   Wt 235 lb (106.6 kg)   SpO2 96% Comment: room air  BMI 32.78 kg/m  There were no vitals filed for this visit.   Physical Exam   General Appearance:    Alert, cooperative, no distress  Eyes:    PERRL, conjunctiva/corneas clear, EOM's intact       Lungs:     Clear to auscultation bilaterally, respirations unlabored  Heart:    Regular rate  and rhythm  Neurologic:   Awake, alert, oriented x 3. No apparent focal neurological           defect.       EKG: NSR.     Assessment & Plan:     1. Essential hypertension Uncontrolled. Considering he is still experience tachycardic episodes, will increase verapamil to 240mg  - EKG 12-Lead  2. SVT (supraventricular tachycardia) (HCC) Increase verapamil as below.  - EKG 12-Lead  3. Palpitations  - verapamil (CALAN-SR) 240 MG CR tablet; Take 1 tablet (240 mg total) by  mouth at bedtime.  Dispense: 30 tablet; Refill: 3  4. Displacement of cervical intervertebral disc without myelopathy Change back to lower 5/325mg  dose of hydrocodone/apap Nabumetone changed to meloxicam due to unavailability of nabumetone.      Return in about 3 months (around 08/16/2016).   Lelon Huh, MD  [facility]

## 2016-06-01 ENCOUNTER — Other Ambulatory Visit: Payer: Self-pay | Admitting: *Deleted

## 2016-06-01 DIAGNOSIS — M5020 Other cervical disc displacement, unspecified cervical region: Secondary | ICD-10-CM

## 2016-06-02 MED ORDER — HYDROCODONE-ACETAMINOPHEN 5-325 MG PO TABS: 1.0000 | ORAL_TABLET | Freq: Four times a day (QID) | ORAL | 0 refills | Status: DC | PRN

## 2016-06-20 ENCOUNTER — Other Ambulatory Visit: Payer: Self-pay | Admitting: *Deleted

## 2016-06-20 DIAGNOSIS — M5020 Other cervical disc displacement, unspecified cervical region: Secondary | ICD-10-CM

## 2016-06-21 MED ORDER — HYDROCODONE-ACETAMINOPHEN 5-325 MG PO TABS: 1.0000 | ORAL_TABLET | Freq: Four times a day (QID) | ORAL | 0 refills | Status: DC | PRN

## 2016-07-05 ENCOUNTER — Other Ambulatory Visit: Payer: Self-pay | Admitting: Family Medicine

## 2016-07-05 DIAGNOSIS — M5020 Other cervical disc displacement, unspecified cervical region: Secondary | ICD-10-CM

## 2016-07-05 MED ORDER — HYDROCODONE-ACETAMINOPHEN 5-325 MG PO TABS: 1.0000 | ORAL_TABLET | Freq: Four times a day (QID) | ORAL | 0 refills | Status: DC | PRN

## 2016-07-05 NOTE — Telephone Encounter (Signed)
Pt contacted office for refill request on the following medications: HYDROcodone-acetaminophen (NORCO/VICODIN) 5-325 MG tablet Last Rx: 06/21/16 LOV: 05/17/16 Please advise. Thanks TNP

## 2016-07-13 ENCOUNTER — Other Ambulatory Visit: Payer: Self-pay | Admitting: Family Medicine

## 2016-07-13 DIAGNOSIS — M5020 Other cervical disc displacement, unspecified cervical region: Secondary | ICD-10-CM

## 2016-07-13 MED ORDER — HYDROCODONE-ACETAMINOPHEN 5-325 MG PO TABS: 1.0000 | ORAL_TABLET | Freq: Four times a day (QID) | ORAL | 0 refills | Status: DC | PRN

## 2016-07-21 ENCOUNTER — Other Ambulatory Visit: Payer: Self-pay | Admitting: Family Medicine

## 2016-07-21 DIAGNOSIS — M5020 Other cervical disc displacement, unspecified cervical region: Secondary | ICD-10-CM

## 2016-07-21 MED ORDER — HYDROCODONE-ACETAMINOPHEN 5-325 MG PO TABS: 1.0000 | ORAL_TABLET | Freq: Four times a day (QID) | ORAL | 0 refills | Status: DC | PRN

## 2016-07-29 DIAGNOSIS — L255 Unspecified contact dermatitis due to plants, except food: Secondary | ICD-10-CM | POA: Diagnosis not present

## 2016-08-03 ENCOUNTER — Other Ambulatory Visit: Payer: Self-pay | Admitting: Physician Assistant

## 2016-08-03 DIAGNOSIS — M5020 Other cervical disc displacement, unspecified cervical region: Secondary | ICD-10-CM

## 2016-08-03 NOTE — Telephone Encounter (Signed)
Last fill was 07/21/16-aa

## 2016-08-04 ENCOUNTER — Other Ambulatory Visit: Payer: Self-pay | Admitting: Physician Assistant

## 2016-08-04 DIAGNOSIS — M5020 Other cervical disc displacement, unspecified cervical region: Secondary | ICD-10-CM

## 2016-08-04 MED ORDER — HYDROCODONE-ACETAMINOPHEN 5-325 MG PO TABS: 1.0000 | ORAL_TABLET | Freq: Four times a day (QID) | ORAL | 0 refills | Status: DC | PRN

## 2016-08-16 ENCOUNTER — Encounter: Payer: Self-pay | Admitting: Family Medicine

## 2016-08-16 ENCOUNTER — Ambulatory Visit (INDEPENDENT_AMBULATORY_CARE_PROVIDER_SITE_OTHER): Payer: BLUE CROSS/BLUE SHIELD | Admitting: Family Medicine

## 2016-08-16 VITALS — BP 110/88 | HR 75 | Temp 98.0°F | Resp 16 | Ht 71.0 in | Wt 242.0 lb

## 2016-08-16 DIAGNOSIS — Z6833 Body mass index (BMI) 33.0-33.9, adult: Secondary | ICD-10-CM | POA: Diagnosis not present

## 2016-08-16 DIAGNOSIS — I471 Supraventricular tachycardia: Secondary | ICD-10-CM | POA: Diagnosis not present

## 2016-08-16 DIAGNOSIS — M5020 Other cervical disc displacement, unspecified cervical region: Secondary | ICD-10-CM | POA: Diagnosis not present

## 2016-08-16 MED ORDER — HYDROCODONE-ACETAMINOPHEN 5-325 MG PO TABS: 1.0000 | ORAL_TABLET | Freq: Four times a day (QID) | ORAL | 0 refills | Status: DC | PRN

## 2016-08-16 NOTE — Progress Notes (Signed)
Patient: Devin Parsons Male    DOB: May 24, 1970   46 y.o.   MRN: [id number] Visit Date: 08/16/2016  Today's Provider: Lelon Huh, MD   Chief Complaint  Patient presents with  . Follow-up  . Hypertension   Subjective:    HPI  Hypertension, follow-up:  BP Readings from Last 3 Encounters:  08/16/16 110/88  05/17/16 (!) 152/110  03/01/16 (!) 168/102    He was last seen for hypertension 3 months ago.  BP at that visit was 152/110. Management since that visit includes; increased verapamil to 240 mg qd..He reports good compliance with treatment. He is not having side effects. none He some exercising. He is adherent to low salt diet.   Outside blood pressures are not checking. He is experiencing none.  Patient denies none.   Cardiovascular risk factors include none.  Use of agents associated with hypertension: none.   ----------------------------------------------------------------    SVT (supraventricular tachycardia) (HCC) From 05/17/2016-Increased verapamil. States he rarely has palpitation anymore.   Displacement of cervical intervertebral disc without myelopathy From 05/17/2016-Change back to lower 5/325mg  dose of hydrocodone/apap Nabumetone changed to meloxicam due to unavailability of nabumetone.     No Known Allergies   Current Outpatient Prescriptions:  .  buPROPion (WELLBUTRIN SR) 150 MG 12 hr tablet, 1 tablet daily for 3 days, then 1 tablet twice daily. Stop smoking 14 days after starting medication, Disp: 60 tablet, Rfl: 6 .  HYDROcodone-acetaminophen (NORCO/VICODIN) 5-325 MG tablet, Take 1 tablet by mouth every 6 (six) hours as needed for moderate pain., Disp: 30 tablet, Rfl: 0 .  ibuprofen (ADVIL,MOTRIN) 200 MG tablet, Take 400-600 mg by mouth 2 (two) times daily as needed for headache or mild pain. , Disp: , Rfl:  .  meloxicam (MOBIC) 15 MG tablet, Take 1 tablet (15 mg total) by mouth daily. As needed for pain, Disp: 30 tablet, Rfl: 2 .   verapamil (CALAN-SR) 240 MG CR tablet, Take 1 tablet (240 mg total) by mouth at bedtime., Disp: 30 tablet, Rfl: 3  Review of Systems  Constitutional: Negative for appetite change, chills and fever.  Respiratory: Negative for chest tightness, shortness of breath and wheezing.   Cardiovascular: Negative for chest pain and palpitations.  Gastrointestinal: Negative for abdominal pain, nausea and vomiting.    Social History  Substance Use Topics  . Smoking status: Current Every Day Smoker    Packs/day: 0.50    Years: 31.00  . Smokeless tobacco: Never Used  . Alcohol use No     Comment: formerly a heavy drinker in high school   Objective:   BP 110/88 (BP Location: Right Arm, Patient Position: Sitting, Cuff Size: Large)   Pulse 75   Temp 98 F (36.7 C) (Oral)   Resp 16   Ht 5\' 11"  (1.803 m)   Wt 242 lb (109.8 kg)   SpO2 96%   BMI 33.75 kg/m  Vitals:   08/16/16 1608  BP: 110/88  Pulse: 75  Resp: 16  Temp: 98 F (36.7 C)  TempSrc: Oral  SpO2: 96%  Weight: 242 lb (109.8 kg)  Height: 5\' 11"  (1.803 m)   Depression screen [facility] 2/9 08/16/2016  Decreased Interest 1  Down, Depressed, Hopeless 1  PHQ - 2 Score 2  Altered sleeping 1  Tired, decreased energy 1  Change in appetite 0  Feeling bad or failure about yourself  0  Trouble concentrating 0  Moving slowly or fidgety/restless 0  Suicidal thoughts 0  PHQ-9 Score  4  Difficult doing work/chores Somewhat difficult     Physical Exam   General Appearance:    Alert, cooperative, no distress  Eyes:    PERRL, conjunctiva/corneas clear, EOM's intact       Lungs:     Clear to auscultation bilaterally, respirations unlabored  Heart:    Regular rate and rhythm  Neurologic:   Awake, alert, oriented x 3. No apparent focal neurological           defect.           Assessment & Plan:     1. SVT (supraventricular tachycardia) (HCC) Doing well on current dose of diltiazem which we will continue.   2. Displacement of cervical  intervertebral disc without myelopathy Continue current regiment  - HYDROcodone-acetaminophen (NORCO/VICODIN) 5-325 MG tablet; Take 1 tablet by mouth every 6 (six) hours as needed for moderate pain.  Dispense: 30 tablet; Refill: 0  Return 3-4 months for BP and lipids       Lelon Huh, MD  [facility]

## 2016-08-28 ENCOUNTER — Other Ambulatory Visit: Payer: Self-pay | Admitting: Family Medicine

## 2016-08-28 DIAGNOSIS — M5020 Other cervical disc displacement, unspecified cervical region: Secondary | ICD-10-CM

## 2016-08-29 ENCOUNTER — Other Ambulatory Visit: Payer: Self-pay | Admitting: Family Medicine

## 2016-08-29 DIAGNOSIS — M5020 Other cervical disc displacement, unspecified cervical region: Secondary | ICD-10-CM

## 2016-08-30 ENCOUNTER — Other Ambulatory Visit: Payer: Self-pay

## 2016-08-30 NOTE — Telephone Encounter (Signed)
Patient called about refill on Norco, he was hoping to pick it up at lunch time today. Last fill 08/16/16-aa

## 2016-08-31 MED ORDER — HYDROCODONE-ACETAMINOPHEN 5-325 MG PO TABS: 1.0000 | ORAL_TABLET | Freq: Four times a day (QID) | ORAL | 0 refills | Status: DC | PRN

## 2016-09-13 ENCOUNTER — Other Ambulatory Visit: Payer: Self-pay | Admitting: Family Medicine

## 2016-09-13 DIAGNOSIS — M5020 Other cervical disc displacement, unspecified cervical region: Secondary | ICD-10-CM

## 2016-09-13 MED ORDER — HYDROCODONE-ACETAMINOPHEN 5-325 MG PO TABS: 1.0000 | ORAL_TABLET | Freq: Four times a day (QID) | ORAL | 0 refills | Status: DC | PRN

## 2016-09-26 ENCOUNTER — Other Ambulatory Visit: Payer: Self-pay | Admitting: Family Medicine

## 2016-09-26 DIAGNOSIS — M5020 Other cervical disc displacement, unspecified cervical region: Secondary | ICD-10-CM

## 2016-09-26 MED ORDER — HYDROCODONE-ACETAMINOPHEN 5-325 MG PO TABS: 1.0000 | ORAL_TABLET | Freq: Four times a day (QID) | ORAL | 0 refills | Status: DC | PRN

## 2016-10-06 ENCOUNTER — Other Ambulatory Visit: Payer: Self-pay | Admitting: Family Medicine

## 2016-10-06 DIAGNOSIS — R002 Palpitations: Secondary | ICD-10-CM

## 2016-10-09 ENCOUNTER — Other Ambulatory Visit: Payer: Self-pay | Admitting: Family Medicine

## 2016-10-09 DIAGNOSIS — M5020 Other cervical disc displacement, unspecified cervical region: Secondary | ICD-10-CM

## 2016-10-09 MED ORDER — HYDROCODONE-ACETAMINOPHEN 5-325 MG PO TABS: 1.0000 | ORAL_TABLET | Freq: Four times a day (QID) | ORAL | 0 refills | Status: DC | PRN

## 2016-10-20 ENCOUNTER — Other Ambulatory Visit: Payer: Self-pay | Admitting: Family Medicine

## 2016-10-20 DIAGNOSIS — Z720 Tobacco use: Secondary | ICD-10-CM

## 2016-10-20 DIAGNOSIS — M5020 Other cervical disc displacement, unspecified cervical region: Secondary | ICD-10-CM

## 2016-10-23 MED ORDER — BUPROPION HCL ER (SR) 150 MG PO TB12: 150.0000 mg | ORAL_TABLET | Freq: Two times a day (BID) | ORAL | 6 refills | Status: DC

## 2016-10-23 MED ORDER — HYDROCODONE-ACETAMINOPHEN 5-325 MG PO TABS: 1.0000 | ORAL_TABLET | Freq: Four times a day (QID) | ORAL | 0 refills | Status: DC | PRN

## 2016-11-06 ENCOUNTER — Other Ambulatory Visit: Payer: Self-pay | Admitting: Family Medicine

## 2016-11-06 DIAGNOSIS — M5020 Other cervical disc displacement, unspecified cervical region: Secondary | ICD-10-CM

## 2016-11-07 ENCOUNTER — Encounter: Payer: Self-pay | Admitting: Family Medicine

## 2016-11-07 MED ORDER — HYDROCODONE-ACETAMINOPHEN 5-325 MG PO TABS: 1.0000 | ORAL_TABLET | Freq: Four times a day (QID) | ORAL | 0 refills | Status: DC | PRN

## 2016-11-14 ENCOUNTER — Encounter: Payer: Self-pay | Admitting: Family Medicine

## 2016-11-14 ENCOUNTER — Ambulatory Visit (INDEPENDENT_AMBULATORY_CARE_PROVIDER_SITE_OTHER): Payer: BLUE CROSS/BLUE SHIELD | Admitting: Family Medicine

## 2016-11-14 VITALS — BP 140/106 | HR 91 | Temp 97.9°F | Resp 16 | Wt 241.0 lb

## 2016-11-14 DIAGNOSIS — R002 Palpitations: Secondary | ICD-10-CM

## 2016-11-14 DIAGNOSIS — M545 Low back pain: Secondary | ICD-10-CM | POA: Diagnosis not present

## 2016-11-14 DIAGNOSIS — I10 Essential (primary) hypertension: Secondary | ICD-10-CM | POA: Diagnosis not present

## 2016-11-14 DIAGNOSIS — G8929 Other chronic pain: Secondary | ICD-10-CM | POA: Diagnosis not present

## 2016-11-14 MED ORDER — VERAPAMIL HCL ER 300 MG PO CP24: 300.0000 mg | ORAL_CAPSULE | Freq: Every day | ORAL | 3 refills | Status: DC

## 2016-11-14 NOTE — Progress Notes (Signed)
Patient: Devin Parsons Male    DOB: 05-17-1970   46 y.o.   MRN: [id number] Visit Date: 11/14/2016  Today's Provider: Lelon Huh, MD   Chief Complaint  Patient presents with  . Hypertension  . Pain  . Hyperlipidemia   Subjective:    HPI   Hypertension, follow-up:  BP Readings from Last 3 Encounters:  08/16/16 110/88  05/17/16 (!) 152/110  03/01/16 (!) 168/102    He was last seen for hypertension 6 months ago.  BP at that visit was 152/110. Management since that visit includes;increased verapamil to 240mg  .He reports good compliance with treatment. He is not having side effects.  He is not exercising. He is adherent to low salt diet.   Outside blood pressures are checked and have been averaging 160/110. He is experiencing none.  Patient denieschest pain, chest pressure/discomfort, claudication, dyspnea, exertional chest pressure/discomfort, fatigue, irregular heart beat, lower extremity edema, near-syncope, orthopnea, paroxysmal nocturnal dyspnea, syncope and tachypnea .   Cardiovascular risk factors include hypertension and male gender.  Use of agents associated with hypertension: none.   ------------------------------------------------------------------------    Lipid/Cholesterol, Follow-up:   Last seen for this 08/31/2015.  Management since that visit includes; labs checked, no changes.  Last Lipid Panel:    Component Value Date/Time   CHOL 208 (H) 08/31/2015 1604   TRIG 237 (H) 08/31/2015 1604   HDL 32 (L) 08/31/2015 1604   CHOLHDL 6.5 (H) 08/31/2015 1604   CHOLHDL 7.2 08/14/2014 0603   VLDL 26 08/14/2014 0603   LDLCALC 129 (H) 08/31/2015 1604    He reports good compliance with treatment. He is not having side effects.   Wt Readings from Last 3 Encounters:  08/16/16 242 lb (109.8 kg)  05/17/16 235 lb (106.6 kg)  03/01/16 239 lb (108.4 kg)    ------------------------------------------------------------------------ SVT (supraventricular  tachycardia) (HCC) From 08/16/2016-Doing well on current dose of diltiazem which we will continue.   Displacement of cervical intervertebral disc without myelopathy From 08/16/2016-refilled HYDROcodone-acetaminophen (NORCO/VICODIN) 5-325 MG tablet. Patient reports good compliance with treatment, good tolerance and fair symptom comntrol.   No Known Allergies   Current Outpatient Prescriptions:  .  buPROPion (WELLBUTRIN SR) 150 MG 12 hr tablet, Take 1 tablet (150 mg total) by mouth 2 (two) times daily., Disp: 60 tablet, Rfl: 6 .  HYDROcodone-acetaminophen (NORCO/VICODIN) 5-325 MG tablet, Take 1 tablet by mouth every 6 (six) hours as needed for moderate pain., Disp: 30 tablet, Rfl: 0 .  ibuprofen (ADVIL,MOTRIN) 200 MG tablet, Take 400-600 mg by mouth 2 (two) times daily as needed for headache or mild pain. , Disp: , Rfl:  .  meloxicam (MOBIC) 15 MG tablet, Take 1 tablet (15 mg total) by mouth daily. As needed for pain, Disp: 30 tablet, Rfl: 2 .  verapamil (CALAN-SR) 240 MG CR tablet, TAKE ONE TABLET BY MOUTH AT BEDTIME, Disp: 30 tablet, Rfl: 12  Review of Systems  Constitutional: Negative for appetite change, chills and fever.  Respiratory: Negative for chest tightness, shortness of breath and wheezing.   Cardiovascular: Positive for palpitations (unchanged from baseline). Negative for chest pain.  Gastrointestinal: Negative for abdominal pain, nausea and vomiting.    Social History  Substance Use Topics  . Smoking status: Current Every Day Smoker    Packs/day: 0.50    Years: 31.00  . Smokeless tobacco: Never Used  . Alcohol use No     Comment: formerly a heavy drinker in high school   Objective:   BP Marland Kitchen)  140/106 (BP Location: Right Arm, Cuff Size: Large)   Pulse 91   Temp 97.9 F (36.6 C) (Oral)   Resp 16   Wt 241 lb (109.3 kg)   SpO2 96% Comment: room air  BMI 33.61 kg/m  Vitals:   11/14/16 1338 11/14/16 1339  BP: (!) 144/110 (!) 140/106  Pulse: 91   Resp: 16   Temp:  97.9 F (36.6 C)   TempSrc: Oral   SpO2: 96%   Weight: 241 lb (109.3 kg)      Physical Exam   General Appearance:    Alert, cooperative, no distress  Eyes:    PERRL, conjunctiva/corneas clear, EOM's intact       Lungs:     Clear to auscultation bilaterally, respirations unlabored  Heart:    Regular rate and rhythm  Neurologic:   Awake, alert, oriented x 3. No apparent focal neurological           defect.           Assessment & Plan:     1. Essential hypertension Uncontrolled. Increase verapamil to 300. Counseled on low sodium diet and increase exercise.  - Verapamil HCl CR 300 MG CP24; Take 1 capsule (300 mg total) by mouth daily.  Dispense: 30 each; Refill: 3  2. Palpitations Well controlled on verapamil.  - Verapamil HCl CR 300 MG CP24; Take 1 capsule (300 mg total) by mouth daily.  Dispense: 30 each; Refill: 3  3. Chronic low back pain without sciatica, unspecified back pain laterality Doing well on current dose of hydrocodone/apap   Return in about 3 months (around 02/14/2017).       Lelon Huh, MD  [facility]

## 2016-11-16 ENCOUNTER — Other Ambulatory Visit: Payer: Self-pay | Admitting: Family Medicine

## 2016-11-16 DIAGNOSIS — M5020 Other cervical disc displacement, unspecified cervical region: Secondary | ICD-10-CM

## 2016-11-16 MED ORDER — HYDROCODONE-ACETAMINOPHEN 5-325 MG PO TABS: 1.0000 | ORAL_TABLET | Freq: Four times a day (QID) | ORAL | 0 refills | Status: DC | PRN

## 2016-11-17 ENCOUNTER — Ambulatory Visit: Payer: Self-pay | Admitting: Family Medicine

## 2016-11-20 ENCOUNTER — Other Ambulatory Visit: Payer: Self-pay | Admitting: Family Medicine

## 2016-11-20 DIAGNOSIS — Z720 Tobacco use: Secondary | ICD-10-CM

## 2016-11-25 DIAGNOSIS — Z23 Encounter for immunization: Secondary | ICD-10-CM | POA: Diagnosis not present

## 2016-11-27 ENCOUNTER — Other Ambulatory Visit: Payer: Self-pay | Admitting: Family Medicine

## 2016-11-27 DIAGNOSIS — M5020 Other cervical disc displacement, unspecified cervical region: Secondary | ICD-10-CM

## 2016-11-27 MED ORDER — HYDROCODONE-ACETAMINOPHEN 5-325 MG PO TABS: 1.0000 | ORAL_TABLET | Freq: Four times a day (QID) | ORAL | 0 refills | Status: DC | PRN

## 2016-12-06 ENCOUNTER — Other Ambulatory Visit: Payer: Self-pay | Admitting: Family Medicine

## 2016-12-06 DIAGNOSIS — M5020 Other cervical disc displacement, unspecified cervical region: Secondary | ICD-10-CM

## 2016-12-06 MED ORDER — HYDROCODONE-ACETAMINOPHEN 5-325 MG PO TABS: 1.0000 | ORAL_TABLET | Freq: Four times a day (QID) | ORAL | 0 refills | Status: DC | PRN

## 2016-12-18 ENCOUNTER — Other Ambulatory Visit: Payer: Self-pay | Admitting: Family Medicine

## 2016-12-18 DIAGNOSIS — M5020 Other cervical disc displacement, unspecified cervical region: Secondary | ICD-10-CM

## 2016-12-19 MED ORDER — HYDROCODONE-ACETAMINOPHEN 5-325 MG PO TABS: 1.0000 | ORAL_TABLET | Freq: Four times a day (QID) | ORAL | 0 refills | Status: DC | PRN

## 2016-12-19 NOTE — Telephone Encounter (Signed)
Please advise patient that hydrocodone/apap  prescription has been sent electronically to   [facility] [phone] -

## 2016-12-20 NOTE — Telephone Encounter (Signed)
Pt called asking about Rx for HYDROcodone-acetaminophen (NORCO/VICODIN) 5-325 MG tablet pt was advised as below. Thanks TNP

## 2016-12-20 NOTE — Telephone Encounter (Signed)
Patient was advised.  

## 2016-12-28 ENCOUNTER — Other Ambulatory Visit: Payer: Self-pay | Admitting: Family Medicine

## 2016-12-28 DIAGNOSIS — M5020 Other cervical disc displacement, unspecified cervical region: Secondary | ICD-10-CM

## 2016-12-29 MED ORDER — HYDROCODONE-ACETAMINOPHEN 5-325 MG PO TABS: 1.0000 | ORAL_TABLET | Freq: Four times a day (QID) | ORAL | 0 refills | Status: DC | PRN

## 2016-12-29 NOTE — Telephone Encounter (Signed)
Pt advised.

## 2016-12-29 NOTE — Telephone Encounter (Signed)
Pt is requesting an update on his refill request for HYDROcodone-acetaminophen (NORCO/VICODIN) 5-325 MG tablet. Pt stated he would like to get the Rx today if possible so he can get it before it snows. Nye Please advise. Thanks TNP

## 2016-12-29 NOTE — Telephone Encounter (Signed)
Please advise patient that hydrocodone/apap  prescription has been sent electronically to [facility] [address] [place] [address] Phone: [phone] Fax: [phone]

## 2017-01-06 ENCOUNTER — Other Ambulatory Visit: Payer: Self-pay | Admitting: Family Medicine

## 2017-01-06 DIAGNOSIS — M5020 Other cervical disc displacement, unspecified cervical region: Secondary | ICD-10-CM

## 2017-01-09 MED ORDER — HYDROCODONE-ACETAMINOPHEN 5-325 MG PO TABS: 1.0000 | ORAL_TABLET | Freq: Four times a day (QID) | ORAL | 0 refills | Status: DC | PRN

## 2017-01-09 NOTE — Telephone Encounter (Signed)
Pt called saying pharmacy does not have his rx in yet.  Pt wants to know status of the rx.  His call back is [phone]

## 2017-01-09 NOTE — Telephone Encounter (Signed)
Pt called to check on refill. Please advise. Thanks TNP

## 2017-01-17 ENCOUNTER — Other Ambulatory Visit: Payer: Self-pay | Admitting: Family Medicine

## 2017-01-17 DIAGNOSIS — M5020 Other cervical disc displacement, unspecified cervical region: Secondary | ICD-10-CM

## 2017-01-17 MED ORDER — HYDROCODONE-ACETAMINOPHEN 5-325 MG PO TABS: 1.0000 | ORAL_TABLET | Freq: Four times a day (QID) | ORAL | 0 refills | Status: DC | PRN

## 2017-01-25 ENCOUNTER — Other Ambulatory Visit: Payer: Self-pay | Admitting: Family Medicine

## 2017-01-25 DIAGNOSIS — M5020 Other cervical disc displacement, unspecified cervical region: Secondary | ICD-10-CM

## 2017-01-25 MED ORDER — HYDROCODONE-ACETAMINOPHEN 5-325 MG PO TABS: 1.0000 | ORAL_TABLET | Freq: Four times a day (QID) | ORAL | 0 refills | Status: DC | PRN

## 2017-02-01 ENCOUNTER — Telehealth: Payer: Self-pay | Admitting: Family Medicine

## 2017-02-01 DIAGNOSIS — M5020 Other cervical disc displacement, unspecified cervical region: Secondary | ICD-10-CM

## 2017-02-02 ENCOUNTER — Encounter: Payer: Self-pay | Admitting: Family Medicine

## 2017-02-02 ENCOUNTER — Other Ambulatory Visit: Payer: Self-pay

## 2017-02-02 DIAGNOSIS — M5020 Other cervical disc displacement, unspecified cervical region: Secondary | ICD-10-CM

## 2017-02-02 MED ORDER — HYDROCODONE-ACETAMINOPHEN 5-325 MG PO TABS: 1.0000 | ORAL_TABLET | Freq: Four times a day (QID) | ORAL | 0 refills | Status: DC | PRN

## 2017-02-02 NOTE — Telephone Encounter (Signed)
Patient is following up on this request that was sent yesterday, last fill was 01/25/17. Patient would like to get this filled before the weekend. Thank Edrick Kins, RMA

## 2017-02-02 NOTE — Telephone Encounter (Signed)
Pt informed RX was sent it

## 2017-02-08 ENCOUNTER — Other Ambulatory Visit: Payer: Self-pay | Admitting: Family Medicine

## 2017-02-08 DIAGNOSIS — M5020 Other cervical disc displacement, unspecified cervical region: Secondary | ICD-10-CM

## 2017-02-09 MED ORDER — HYDROCODONE-ACETAMINOPHEN 5-325 MG PO TABS: 1.0000 | ORAL_TABLET | Freq: Four times a day (QID) | ORAL | 0 refills | Status: DC | PRN

## 2017-02-09 NOTE — Telephone Encounter (Signed)
Pt calling wanting to see the status of the medication request.

## 2017-02-14 ENCOUNTER — Ambulatory Visit: Payer: BLUE CROSS/BLUE SHIELD | Admitting: Family Medicine

## 2017-02-14 ENCOUNTER — Encounter: Payer: Self-pay | Admitting: Family Medicine

## 2017-02-14 VITALS — BP 140/102 | HR 90 | Temp 98.2°F | Resp 16 | Wt 245.8 lb

## 2017-02-14 DIAGNOSIS — M5020 Other cervical disc displacement, unspecified cervical region: Secondary | ICD-10-CM | POA: Diagnosis not present

## 2017-02-14 DIAGNOSIS — M545 Low back pain, unspecified: Secondary | ICD-10-CM

## 2017-02-14 DIAGNOSIS — I10 Essential (primary) hypertension: Secondary | ICD-10-CM | POA: Diagnosis not present

## 2017-02-14 DIAGNOSIS — I471 Supraventricular tachycardia, unspecified: Secondary | ICD-10-CM

## 2017-02-14 DIAGNOSIS — G8929 Other chronic pain: Secondary | ICD-10-CM | POA: Diagnosis not present

## 2017-02-14 MED ORDER — LOSARTAN POTASSIUM 50 MG PO TABS: 50.0000 mg | ORAL_TABLET | Freq: Every day | ORAL | 2 refills | Status: DC

## 2017-02-14 MED ORDER — HYDROCODONE-ACETAMINOPHEN 5-325 MG PO TABS: 1.0000 | ORAL_TABLET | Freq: Four times a day (QID) | ORAL | 0 refills | Status: DC | PRN

## 2017-02-14 NOTE — Progress Notes (Signed)
Patient: Devin Parsons Male    DOB: 12-03-1970   47 y.o.   MRN: [id number] Visit Date: 02/14/2017  Today's Provider: Lelon Huh, MD   Chief Complaint  Patient presents with  . Hypertension  . Back Pain  . Palpitations   Subjective:    HPI   Hypertension, follow-up:  BP Readings from Last 3 Encounters:  11/14/16 (!) 140/106  08/16/16 110/88  05/17/16 (!) 152/110    He was last seen for hypertension 3 months ago.  BP at that visit was 144/110. Management since that visit includes; increased verapamil to 300 mg. Counseled patient on low sodium diet and increased exercise .He reports excellent compliance with treatment. He is not having side effects.  He is not exercising. He is adherent to low salt diet.   Outside blood pressures are reported as systolic readings 423-536 and diastolic 144-315. He is experiencing cardiac symptoms such as chest pain or shortness of breath.  Patient denies any symptoms .   Cardiovascular risk factors include advanced age (older than 43 for men, 32 for women), hypertension, male gender and smoking/ tobacco exposure.  Use of agents associated with hypertension: none.   He states home Bps have been 140s-160s/100s.  ------------------------------------------------------------------------  Palpitations From 11/14/2016- Increased verapamil as above. States he has had no palpations since dose was increased.   Chronic low back pain without sciatica, unspecified back pain laterality From 11/14/2016-Doing well on current dose of hydrocodone/apap. States he usually has to take 2, but sometimes 4 a day.    No Known Allergies   Current Outpatient Medications:  .  buPROPion (WELLBUTRIN SR) 150 MG 12 hr tablet, TAKE 1 TABLET BY MOUTH DAILY FOR 3 DAYS THEN 1 TWICE DAILY. STOP SMOKING 14 DAYSAFTER STARTING MEDICATION., Disp: 60 tablet, Rfl: 5 .  HYDROcodone-acetaminophen (NORCO/VICODIN) 5-325 MG tablet, Take 1 tablet by mouth every 6 (six)  hours as needed for moderate pain., Disp: 30 tablet, Rfl: 0 .  ibuprofen (ADVIL,MOTRIN) 200 MG tablet, Take 400-600 mg by mouth 2 (two) times daily as needed for headache or mild pain. , Disp: , Rfl:  .  meloxicam (MOBIC) 15 MG tablet, Take 1 tablet (15 mg total) by mouth daily. As needed for pain, Disp: 30 tablet, Rfl: 2 .  Verapamil HCl CR 300 MG CP24, Take 1 capsule (300 mg total) by mouth daily., Disp: 30 each, Rfl: 3  Review of Systems  Constitutional: Negative for appetite change, chills and fever.  Respiratory: Negative for chest tightness, shortness of breath and wheezing.   Cardiovascular: Negative for chest pain and palpitations.  Gastrointestinal: Negative for abdominal pain, nausea and vomiting.    Social History   Tobacco Use  . Smoking status: Current Every Day Smoker    Packs/day: 0.50    Years: 31.00    Pack years: 15.50  . Smokeless tobacco: Never Used  Substance Use Topics  . Alcohol use: No    Alcohol/week: 0.0 oz    Comment: formerly a heavy drinker in high school   Objective:    Vitals:   02/14/17 1618 02/14/17 1645  BP: (!) 170/128 (!) 140/102  Pulse: 90   Resp: 16   Temp: 98.2 F (36.8 C)   TempSrc: Oral   SpO2: 96%   Weight: 245 lb 12.8 oz (111.5 kg)      Physical Exam   General Appearance:    Alert, cooperative, no distress  Eyes:    PERRL, conjunctiva/corneas clear, EOM's intact  Lungs:     Clear to auscultation bilaterally, respirations unlabored  Heart:    Regular rate and rhythm  Neurologic:   Awake, alert, oriented x 3. No apparent focal neurological           defect.           Assessment & Plan:     1. Essential hypertension Systolics better, but diastolic still uncontrolled. Add losartan 50mg  and follow up 11-12 weeks.   2. Displacement of cervical intervertebral disc without myelopathy Stable on current regiment of  HYDROcodone-acetaminophen (NORCO/VICODIN) 5-325 MG tablet; Take 1 tablet by mouth every 6 (six) hours as  needed for moderate pain.  Dispense: 30 tablet; Refill: 0  3. Chronic low back pain without sciatica, unspecified back pain laterality   4. SVT (supraventricular tachycardia) (HCC) Rate and symptoms completely controlled on current dose of verapamil.        Lelon Huh, MD  [facility]

## 2017-02-21 ENCOUNTER — Other Ambulatory Visit: Payer: Self-pay | Admitting: Family Medicine

## 2017-02-21 DIAGNOSIS — M5020 Other cervical disc displacement, unspecified cervical region: Secondary | ICD-10-CM

## 2017-02-21 MED ORDER — HYDROCODONE-ACETAMINOPHEN 5-325 MG PO TABS: 1.0000 | ORAL_TABLET | Freq: Four times a day (QID) | ORAL | 0 refills | Status: DC | PRN

## 2017-02-28 ENCOUNTER — Other Ambulatory Visit: Payer: Self-pay | Admitting: Family Medicine

## 2017-02-28 DIAGNOSIS — M5020 Other cervical disc displacement, unspecified cervical region: Secondary | ICD-10-CM

## 2017-02-28 MED ORDER — HYDROCODONE-ACETAMINOPHEN 5-325 MG PO TABS: 1.0000 | ORAL_TABLET | Freq: Four times a day (QID) | ORAL | 0 refills | Status: DC | PRN

## 2017-03-07 ENCOUNTER — Encounter: Payer: Self-pay | Admitting: Family Medicine

## 2017-03-07 ENCOUNTER — Other Ambulatory Visit: Payer: Self-pay | Admitting: Family Medicine

## 2017-03-07 DIAGNOSIS — M5020 Other cervical disc displacement, unspecified cervical region: Secondary | ICD-10-CM

## 2017-03-07 MED ORDER — HYDROCODONE-ACETAMINOPHEN 5-325 MG PO TABS: 1.0000 | ORAL_TABLET | Freq: Four times a day (QID) | ORAL | 0 refills | Status: DC | PRN

## 2017-03-14 ENCOUNTER — Other Ambulatory Visit: Payer: Self-pay | Admitting: Family Medicine

## 2017-03-14 DIAGNOSIS — M5020 Other cervical disc displacement, unspecified cervical region: Secondary | ICD-10-CM

## 2017-03-15 ENCOUNTER — Other Ambulatory Visit: Payer: Self-pay

## 2017-03-15 ENCOUNTER — Other Ambulatory Visit: Payer: Self-pay | Admitting: *Deleted

## 2017-03-15 DIAGNOSIS — M5020 Other cervical disc displacement, unspecified cervical region: Secondary | ICD-10-CM

## 2017-03-15 MED ORDER — HYDROCODONE-ACETAMINOPHEN 5-325 MG PO TABS: 1.0000 | ORAL_TABLET | Freq: Four times a day (QID) | ORAL | 0 refills | Status: DC | PRN

## 2017-03-15 NOTE — Telephone Encounter (Signed)
Dr. Caryn Section, could you please print out a hard copy of Norco for the patient? You sent it in electronically earlier, but the pharmacy is no longer in business and it did not process. The patient says that he can come by the office to pick up a hard copy until he finds a new pharmacy. Thanks!

## 2017-03-15 NOTE — Telephone Encounter (Signed)
Rx was already sent to pharmacy

## 2017-03-28 ENCOUNTER — Other Ambulatory Visit: Payer: Self-pay | Admitting: Family Medicine

## 2017-03-28 DIAGNOSIS — M5020 Other cervical disc displacement, unspecified cervical region: Secondary | ICD-10-CM

## 2017-03-28 MED ORDER — HYDROCODONE-ACETAMINOPHEN 5-325 MG PO TABS: 1.0000 | ORAL_TABLET | Freq: Four times a day (QID) | ORAL | 0 refills | Status: DC | PRN

## 2017-04-10 ENCOUNTER — Other Ambulatory Visit: Payer: Self-pay | Admitting: Family Medicine

## 2017-04-10 DIAGNOSIS — M5020 Other cervical disc displacement, unspecified cervical region: Secondary | ICD-10-CM

## 2017-04-10 MED ORDER — HYDROCODONE-ACETAMINOPHEN 5-325 MG PO TABS: 1.0000 | ORAL_TABLET | Freq: Four times a day (QID) | ORAL | 0 refills | Status: DC | PRN

## 2017-04-12 ENCOUNTER — Ambulatory Visit: Payer: BLUE CROSS/BLUE SHIELD | Admitting: Family Medicine

## 2017-04-12 ENCOUNTER — Encounter: Payer: Self-pay | Admitting: Family Medicine

## 2017-04-12 VITALS — BP 138/96 | Temp 98.1°F | Resp 16 | Ht 70.0 in | Wt 242.0 lb

## 2017-04-12 DIAGNOSIS — Z720 Tobacco use: Secondary | ICD-10-CM

## 2017-04-12 DIAGNOSIS — E782 Mixed hyperlipidemia: Secondary | ICD-10-CM | POA: Diagnosis not present

## 2017-04-12 DIAGNOSIS — M5020 Other cervical disc displacement, unspecified cervical region: Secondary | ICD-10-CM

## 2017-04-12 DIAGNOSIS — R002 Palpitations: Secondary | ICD-10-CM

## 2017-04-12 DIAGNOSIS — I10 Essential (primary) hypertension: Secondary | ICD-10-CM

## 2017-04-12 MED ORDER — MELOXICAM 15 MG PO TABS: 15.0000 mg | ORAL_TABLET | Freq: Every day | ORAL | 2 refills | Status: DC

## 2017-04-12 MED ORDER — VERAPAMIL HCL ER 300 MG PO CP24: 300.0000 mg | ORAL_CAPSULE | Freq: Every day | ORAL | 11 refills | Status: DC

## 2017-04-12 MED ORDER — BUPROPION HCL ER (SR) 150 MG PO TB12: ORAL_TABLET | ORAL | 11 refills | Status: DC

## 2017-04-12 NOTE — Progress Notes (Signed)
Patient: Devin Parsons Male    DOB: 01-12-1971   47 y.o.   MRN: [id number] Visit Date: 04/12/2017  Today's Provider: Lelon Huh, MD   Chief Complaint  Patient presents with  . Hypertension   Subjective:    HPI   Hypertension, follow-up:  BP Readings from Last 3 Encounters:  04/12/17 138/86  02/14/17 (!) 140/102  11/14/16 (!) 140/106    He was last seen for hypertension 2 months ago.  BP at that visit was 140/102. Management since that visit includes; added Losartan 50 mg qd and advised to follow-up in 11-12 weeks.He reports good compliance with treatment. He is not having side effects.  He is exercising. He is adherent to low salt diet.   Outside blood pressures are checked daily. He is experiencing none.  Patient denies exertional chest pressure/discomfort, lower extremity edema and palpitations.   Cardiovascular risk factors include dyslipidemia.   Displacement of cervical intervertebral disc without myelopathy From 02/14/2017-refilled HYDROcodone-acetaminophen (NORCO/VICODIN) 5-325 MG tablet.  SVT (supraventricular tachycardia) ([facility]) From 02/14/2017-no changes.     No Known Allergies   Current Outpatient Medications:  .  buPROPion (WELLBUTRIN SR) 150 MG 12 hr tablet, TAKE 1 TABLET BY MOUTH DAILY FOR 3 DAYS THEN 1 TWICE DAILY. STOP SMOKING 14 DAYSAFTER STARTING MEDICATION., Disp: 60 tablet, Rfl: 5 .  HYDROcodone-acetaminophen (NORCO/VICODIN) 5-325 MG tablet, Take 1 tablet by mouth every 6 (six) hours as needed for moderate pain., Disp: 60 tablet, Rfl: 0 .  ibuprofen (ADVIL,MOTRIN) 200 MG tablet, Take 400-600 mg by mouth 2 (two) times daily as needed for headache or mild pain. , Disp: , Rfl:  .  losartan (COZAAR) 50 MG tablet, Take 1 tablet (50 mg total) by mouth daily., Disp: 90 tablet, Rfl: 2 .  meloxicam (MOBIC) 15 MG tablet, Take 1 tablet (15 mg total) by mouth daily. As needed for pain, Disp: 30 tablet, Rfl: 2 .  Verapamil HCl CR 300 MG  CP24, Take 1 capsule (300 mg total) by mouth daily., Disp: 30 each, Rfl: 3  Review of Systems  Constitutional: Negative for appetite change, chills and fever.  Respiratory: Negative for chest tightness, shortness of breath and wheezing.   Cardiovascular: Negative for chest pain and palpitations.  Gastrointestinal: Negative for abdominal pain, nausea and vomiting.    Social History   Tobacco Use  . Smoking status: Current Every Day Smoker    Packs/day: 0.50    Years: 31.00    Pack years: 15.50  . Smokeless tobacco: Never Used  Substance Use Topics  . Alcohol use: No    Alcohol/week: 0.0 oz    Comment: formerly a heavy drinker in high school   Objective:   BP 138/86 (BP Location: Right Arm, Patient Position: Sitting, Cuff Size: Large)   Temp 98.1 F (36.7 C)   Resp 16   Ht 5\' 10"  (1.778 m)   Wt 242 lb (109.8 kg)   BMI 34.72 kg/m     Physical Exam   General Appearance:    Alert, cooperative, no distress  Eyes:    PERRL, conjunctiva/corneas clear, EOM's intact       Lungs:     Clear to auscultation bilaterally, respirations unlabored  Heart:    Regular rate and rhythm  Neurologic:   Awake, alert, oriented x 3. No apparent focal neurological           defect.           Assessment & Plan:  1. Essential hypertension Improved with addition of losartan, will increase to 100g if renal functions are stable.  - Verapamil HCl CR 300 MG CP24; Take 1 capsule (300 mg total) by mouth daily.  Dispense: 30 each; Refill: 11 - Lipid panel - Comprehensive metabolic panel  2. Palpitations Controlled with- Verapamil HCl CR 300 MG CP24; Take 1 capsule (300 mg total) by mouth daily.  Dispense: 30 each; Refill: 11  3. Displacement of cervical intervertebral disc without myelopathy Doing well with meloxicam which is refilled today.  - meloxicam (MOBIC) 15 MG tablet; Take 1 tablet (15 mg total) by mouth daily. As needed for pain  Dispense: 30 tablet; Refill: 2  4. Tobacco  abuse Has done well with- buPROPion (WELLBUTRIN SR) 150 MG 12 hr tablet; One tablet twice a day  Dispense: 60 tablet; Refill: 11 He feels this has also helped his mood and wishes to continue taking same dose.   5. Hyperlipidemia, mixed Diet controlled.  - Lipid panel - Comprehensive metabolic panel       Lelon Huh, MD  [facility]

## 2017-04-13 ENCOUNTER — Telehealth: Payer: Self-pay | Admitting: *Deleted

## 2017-04-13 LAB — LIPID PANEL
CHOL/HDL RATIO: 6.4 ratio — AB (ref 0.0–5.0)
Cholesterol, Total: 180 mg/dL (ref 100–199)
HDL: 28 mg/dL — AB (ref >39)
LDL Calculated: 112 mg/dL — ABNORMAL HIGH (ref 0–99)
TRIGLYCERIDES: 202 mg/dL — AB (ref 0–149)
VLDL CHOLESTEROL CAL: 40 mg/dL (ref 5–40)

## 2017-04-13 LAB — COMPREHENSIVE METABOLIC PANEL
ALBUMIN: 4.6 g/dL (ref 3.5–5.5)
ALT: 26 IU/L (ref 0–44)
AST: 21 IU/L (ref 0–40)
Albumin/Globulin Ratio: 1.8 (ref 1.2–2.2)
Alkaline Phosphatase: 67 IU/L (ref 39–117)
BUN / CREAT RATIO: 14 (ref 9–20)
BUN: 12 mg/dL (ref 6–24)
Bilirubin Total: <0.2 mg/dL (ref 0.0–1.2)
CO2: 24 mmol/L (ref 20–29)
CREATININE: 0.86 mg/dL (ref 0.76–1.27)
Calcium: 10.1 mg/dL (ref 8.7–10.2)
Chloride: 105 mmol/L (ref 96–106)
GFR calc non Af Amer: 102 mL/min/{1.73_m2} (ref >59)
GFR, EST AFRICAN AMERICAN: 118 mL/min/{1.73_m2} (ref >59)
GLUCOSE: 78 mg/dL (ref 65–99)
Globulin, Total: 2.6 g/dL (ref 1.5–4.5)
Potassium: 4.2 mmol/L (ref 3.5–5.2)
Sodium: 144 mmol/L (ref 134–144)
Total Protein: 7.2 g/dL (ref 6.0–8.5)

## 2017-04-13 NOTE — Telephone Encounter (Signed)
-----   Message from Birdie Sons, MD sent at 04/13/2017  8:11 AM EDT ----- cholesterol is down from 208 last year to 180 this year. blood sugar, kidney functions, and electrolytes are all normal. Can increase losartan to 100mg  a day (can take 2 50mg  tablet until gone. Will send in rx for 100mg  tablets before they run out.

## 2017-04-13 NOTE — Telephone Encounter (Signed)
LMOVM for pt to return call 

## 2017-04-17 NOTE — Telephone Encounter (Signed)
LMOVM for pt to return call 

## 2017-04-18 NOTE — Telephone Encounter (Signed)
Patient advised. He verbalized understanding.  

## 2017-04-23 ENCOUNTER — Other Ambulatory Visit: Payer: Self-pay | Admitting: Family Medicine

## 2017-04-23 ENCOUNTER — Telehealth: Payer: Self-pay | Admitting: Family Medicine

## 2017-04-23 DIAGNOSIS — I10 Essential (primary) hypertension: Secondary | ICD-10-CM

## 2017-04-23 DIAGNOSIS — M5020 Other cervical disc displacement, unspecified cervical region: Secondary | ICD-10-CM

## 2017-04-23 MED ORDER — LOSARTAN POTASSIUM 100 MG PO TABS: 100.0000 mg | ORAL_TABLET | Freq: Every day | ORAL | 2 refills | Status: DC

## 2017-04-23 NOTE — Telephone Encounter (Signed)
Refill losartan at 100mg  per last o.v. note

## 2017-04-25 MED ORDER — HYDROCODONE-ACETAMINOPHEN 5-325 MG PO TABS: 1.0000 | ORAL_TABLET | Freq: Four times a day (QID) | ORAL | 0 refills | Status: DC | PRN

## 2017-04-25 NOTE — Telephone Encounter (Signed)
Pt states he sent request on Monday for Hydrocodone 5-325 and it has not been called in yet.  States he is now out of medication.

## 2017-05-07 ENCOUNTER — Other Ambulatory Visit: Payer: Self-pay | Admitting: Family Medicine

## 2017-05-07 DIAGNOSIS — M5020 Other cervical disc displacement, unspecified cervical region: Secondary | ICD-10-CM

## 2017-05-07 MED ORDER — HYDROCODONE-ACETAMINOPHEN 5-325 MG PO TABS: 1.0000 | ORAL_TABLET | Freq: Four times a day (QID) | ORAL | 0 refills | Status: DC | PRN

## 2017-05-21 ENCOUNTER — Other Ambulatory Visit: Payer: Self-pay | Admitting: Family Medicine

## 2017-05-21 ENCOUNTER — Telehealth: Payer: Self-pay | Admitting: Family Medicine

## 2017-05-21 DIAGNOSIS — M5020 Other cervical disc displacement, unspecified cervical region: Secondary | ICD-10-CM

## 2017-05-21 MED ORDER — HYDROCODONE-ACETAMINOPHEN 5-325 MG PO TABS: 1.0000 | ORAL_TABLET | Freq: Four times a day (QID) | ORAL | 0 refills | Status: DC | PRN

## 2017-05-21 MED ORDER — HYOSCYAMINE SULFATE 0.125 MG SL SUBL: 0.1250 mg | SUBLINGUAL_TABLET | SUBLINGUAL | 0 refills | Status: DC | PRN

## 2017-05-21 NOTE — Telephone Encounter (Signed)
Called Kim back. Maudie Mercury stated that patient's stomach pain has been recurrent for the past 2 years. Pain described as a gas pain in upper middle region of abdomen. Also has mild constipation. Patient has visited [facility] 2 times for this and was given a medication. They cannot remember the name of med. Please advise?

## 2017-05-21 NOTE — Telephone Encounter (Signed)
Have sent prescription for hyoscyamine. O.v. If not effective.

## 2017-05-21 NOTE — Telephone Encounter (Signed)
Pt's wife called asking if Dr. Caryn Section can call in something for his stomach pains.  She said a doctor at a [facility] gave him something before that helped him. Antispasmatic   She tried to say the name but wasn't sure of it.  I told her he was probably going to have to come in.  She said he was the only person at work today...  They use [facility]  The call back is [phone]  Con Memos

## 2017-05-21 NOTE — Telephone Encounter (Signed)
Patient was advised.  

## 2017-05-31 DIAGNOSIS — H5203 Hypermetropia, bilateral: Secondary | ICD-10-CM | POA: Diagnosis not present

## 2017-06-04 ENCOUNTER — Encounter: Payer: Self-pay | Admitting: Family Medicine

## 2017-06-04 ENCOUNTER — Other Ambulatory Visit: Payer: Self-pay | Admitting: Family Medicine

## 2017-06-04 DIAGNOSIS — M5020 Other cervical disc displacement, unspecified cervical region: Secondary | ICD-10-CM

## 2017-06-04 MED ORDER — HYDROCODONE-ACETAMINOPHEN 5-325 MG PO TABS: 1.0000 | ORAL_TABLET | Freq: Four times a day (QID) | ORAL | 0 refills | Status: DC | PRN

## 2017-06-19 ENCOUNTER — Other Ambulatory Visit: Payer: Self-pay | Admitting: Family Medicine

## 2017-06-19 DIAGNOSIS — M5020 Other cervical disc displacement, unspecified cervical region: Secondary | ICD-10-CM

## 2017-06-19 MED ORDER — HYDROCODONE-ACETAMINOPHEN 5-325 MG PO TABS: 1.0000 | ORAL_TABLET | Freq: Four times a day (QID) | ORAL | 0 refills | Status: DC | PRN

## 2017-07-03 ENCOUNTER — Other Ambulatory Visit: Payer: Self-pay | Admitting: Family Medicine

## 2017-07-03 DIAGNOSIS — M5020 Other cervical disc displacement, unspecified cervical region: Secondary | ICD-10-CM

## 2017-07-03 MED ORDER — HYDROCODONE-ACETAMINOPHEN 5-325 MG PO TABS: 1.0000 | ORAL_TABLET | Freq: Four times a day (QID) | ORAL | 0 refills | Status: DC | PRN

## 2017-07-10 ENCOUNTER — Encounter: Payer: Self-pay | Admitting: Family Medicine

## 2017-07-10 ENCOUNTER — Ambulatory Visit: Payer: BLUE CROSS/BLUE SHIELD | Admitting: Family Medicine

## 2017-07-10 VITALS — BP 130/88 | HR 82 | Temp 98.1°F | Resp 16 | Wt 236.0 lb

## 2017-07-10 DIAGNOSIS — I10 Essential (primary) hypertension: Secondary | ICD-10-CM | POA: Diagnosis not present

## 2017-07-10 DIAGNOSIS — L989 Disorder of the skin and subcutaneous tissue, unspecified: Secondary | ICD-10-CM | POA: Diagnosis not present

## 2017-07-10 NOTE — Progress Notes (Signed)
Patient: Devin Parsons Male    DOB: 06-Dec-1970   47 y.o.   MRN: [id number] Visit Date: 07/10/2017  Today's Provider: Lelon Huh, MD   Chief Complaint  Patient presents with  . Follow-up  . Hypertension   Subjective:    HPI   Hypertension, follow-up:  BP Readings from Last 3 Encounters:  07/10/17 130/88  04/12/17 (!) 138/96  02/14/17 (!) 140/102    He was last seen for hypertension 3 months ago.  BP at that visit was 138/96. Management since that visit includes; increased losartan to 100 mg qd.He reports good compliance with treatment. He is not having side effects. none He is exercising. He is adherent to low salt diet.   Outside blood pressures are checked daily. He is experiencing none.  Patient denies none.   Cardiovascular risk factors include dyslipidemia.  Use of agents associated with hypertension: none. -----------------------------------------------------------------  Patient has a mole or growth on right side of his forehead. Mole has been there for at least a year and has been slowly getting larger. Has been bleeding off and on, and he has hard time not picking at.   No Known Allergies   Current Outpatient Medications:  .  buPROPion (WELLBUTRIN SR) 150 MG 12 hr tablet, One tablet twice a day, Disp: 60 tablet, Rfl: 11 .  HYDROcodone-acetaminophen (NORCO/VICODIN) 5-325 MG tablet, Take 1 tablet by mouth every 6 (six) hours as needed for moderate pain., Disp: 60 tablet, Rfl: 0 .  hyoscyamine (LEVSIN SL) 0.125 MG SL tablet, Place 1 tablet (0.125 mg total) under the tongue every 4 (four) hours as needed., Disp: 30 tablet, Rfl: 0 .  ibuprofen (ADVIL,MOTRIN) 200 MG tablet, Take 400-600 mg by mouth 2 (two) times daily as needed for headache or mild pain. , Disp: , Rfl:  .  losartan (COZAAR) 100 MG tablet, Take 1 tablet (100 mg total) by mouth daily., Disp: 90 tablet, Rfl: 2 .  meloxicam (MOBIC) 15 MG tablet, Take 1 tablet (15 mg total) by mouth daily. As  needed for pain, Disp: 30 tablet, Rfl: 2 .  Verapamil HCl CR 300 MG CP24, Take 1 capsule (300 mg total) by mouth daily., Disp: 30 each, Rfl: 11  Review of Systems  Constitutional: Negative for appetite change, chills and fever.  Respiratory: Negative for chest tightness, shortness of breath and wheezing.   Cardiovascular: Negative for chest pain and palpitations.  Gastrointestinal: Negative for abdominal pain, nausea and vomiting.    Social History   Tobacco Use  . Smoking status: Current Every Day Smoker    Packs/day: 0.50    Years: 31.00    Pack years: 15.50  . Smokeless tobacco: Never Used  Substance Use Topics  . Alcohol use: No    Alcohol/week: 0.0 oz    Comment: formerly a heavy drinker in high school   Objective:   BP 130/88 (BP Location: Right Arm, Patient Position: Sitting, Cuff Size: Large)   Pulse 82   Temp 98.1 F (36.7 C) (Oral)   Resp 16   Wt 236 lb (107 kg)   SpO2 95%   BMI 33.86 kg/m     Physical Exam  General appearance: alert, well developed, well nourished, cooperative and in no distress Head: Normocephalic, without obvious abnormality, atraumatic Respiratory: Respirations even and unlabored, normal respiratory rate Extremities: No gross deformities Skin: About cm dm round exophytic irregularly pigmented lesion right anterolateral forehead with small area of bleeding.      Assessment &  Plan:     1. Skin lesion of scalp  - Ambulatory referral to Dermatology  2. Essential hypertension Well controlled on increased dose of losartan - Ambulatory referral to Dermatology       Lelon Huh, MD  [facility]

## 2017-07-17 ENCOUNTER — Other Ambulatory Visit: Payer: Self-pay | Admitting: Family Medicine

## 2017-07-17 DIAGNOSIS — M5020 Other cervical disc displacement, unspecified cervical region: Secondary | ICD-10-CM

## 2017-07-17 MED ORDER — HYDROCODONE-ACETAMINOPHEN 5-325 MG PO TABS: 1.0000 | ORAL_TABLET | Freq: Four times a day (QID) | ORAL | 0 refills | Status: DC | PRN

## 2017-07-17 NOTE — Telephone Encounter (Signed)
For Dr Fisher 

## 2017-07-31 ENCOUNTER — Other Ambulatory Visit: Payer: Self-pay | Admitting: Family Medicine

## 2017-07-31 DIAGNOSIS — M5020 Other cervical disc displacement, unspecified cervical region: Secondary | ICD-10-CM

## 2017-07-31 MED ORDER — HYDROCODONE-ACETAMINOPHEN 5-325 MG PO TABS: 1.0000 | ORAL_TABLET | Freq: Four times a day (QID) | ORAL | 0 refills | Status: DC | PRN

## 2017-08-15 ENCOUNTER — Other Ambulatory Visit: Payer: Self-pay | Admitting: Family Medicine

## 2017-08-15 DIAGNOSIS — M5020 Other cervical disc displacement, unspecified cervical region: Secondary | ICD-10-CM

## 2017-08-15 MED ORDER — HYDROCODONE-ACETAMINOPHEN 5-325 MG PO TABS
1.0000 | ORAL_TABLET | Freq: Four times a day (QID) | ORAL | 0 refills | Status: DC | PRN
Start: 2017-08-15 — End: 2017-08-29

## 2017-08-29 ENCOUNTER — Other Ambulatory Visit: Payer: Self-pay | Admitting: *Deleted

## 2017-08-29 ENCOUNTER — Other Ambulatory Visit: Payer: Self-pay | Admitting: Family Medicine

## 2017-08-29 DIAGNOSIS — M5020 Other cervical disc displacement, unspecified cervical region: Secondary | ICD-10-CM

## 2017-08-29 MED ORDER — HYDROCODONE-ACETAMINOPHEN 5-325 MG PO TABS: 1.0000 | ORAL_TABLET | Freq: Four times a day (QID) | ORAL | 0 refills | Status: DC | PRN

## 2017-08-30 ENCOUNTER — Other Ambulatory Visit: Payer: Self-pay

## 2017-08-30 DIAGNOSIS — M5020 Other cervical disc displacement, unspecified cervical region: Secondary | ICD-10-CM

## 2017-08-30 MED ORDER — HYDROCODONE-ACETAMINOPHEN 5-325 MG PO TABS: 1.0000 | ORAL_TABLET | Freq: Four times a day (QID) | ORAL | 0 refills | Status: DC | PRN

## 2017-08-30 NOTE — Telephone Encounter (Signed)
Pt reports [facility] in [place] was having computer problems and they never received this refill.  Please resend.     Thanks,   -Mickel Baas

## 2017-09-06 DIAGNOSIS — D485 Neoplasm of uncertain behavior of skin: Secondary | ICD-10-CM | POA: Diagnosis not present

## 2017-09-06 DIAGNOSIS — C44519 Basal cell carcinoma of skin of other part of trunk: Secondary | ICD-10-CM | POA: Diagnosis not present

## 2017-09-06 DIAGNOSIS — L739 Follicular disorder, unspecified: Secondary | ICD-10-CM | POA: Diagnosis not present

## 2017-09-06 DIAGNOSIS — D2262 Melanocytic nevi of left upper limb, including shoulder: Secondary | ICD-10-CM | POA: Diagnosis not present

## 2017-09-06 DIAGNOSIS — D2261 Melanocytic nevi of right upper limb, including shoulder: Secondary | ICD-10-CM | POA: Diagnosis not present

## 2017-09-06 DIAGNOSIS — D225 Melanocytic nevi of trunk: Secondary | ICD-10-CM | POA: Diagnosis not present

## 2017-09-06 DIAGNOSIS — C44319 Basal cell carcinoma of skin of other parts of face: Secondary | ICD-10-CM | POA: Diagnosis not present

## 2017-09-12 ENCOUNTER — Other Ambulatory Visit: Payer: Self-pay | Admitting: Family Medicine

## 2017-09-12 DIAGNOSIS — M5020 Other cervical disc displacement, unspecified cervical region: Secondary | ICD-10-CM

## 2017-09-12 MED ORDER — HYOSCYAMINE SULFATE 0.125 MG SL SUBL: 0.1250 mg | SUBLINGUAL_TABLET | SUBLINGUAL | 0 refills | Status: DC | PRN

## 2017-09-12 MED ORDER — HYDROCODONE-ACETAMINOPHEN 5-325 MG PO TABS
1.0000 | ORAL_TABLET | Freq: Four times a day (QID) | ORAL | 0 refills | Status: DC | PRN
Start: 2017-09-12 — End: 2017-09-27

## 2017-09-27 ENCOUNTER — Other Ambulatory Visit: Payer: Self-pay | Admitting: Family Medicine

## 2017-09-27 DIAGNOSIS — M5020 Other cervical disc displacement, unspecified cervical region: Secondary | ICD-10-CM

## 2017-09-27 MED ORDER — HYDROCODONE-ACETAMINOPHEN 5-325 MG PO TABS: 1.0000 | ORAL_TABLET | Freq: Four times a day (QID) | ORAL | 0 refills | Status: DC | PRN

## 2017-10-01 DIAGNOSIS — C44519 Basal cell carcinoma of skin of other part of trunk: Secondary | ICD-10-CM | POA: Diagnosis not present

## 2017-10-08 DIAGNOSIS — L988 Other specified disorders of the skin and subcutaneous tissue: Secondary | ICD-10-CM | POA: Diagnosis not present

## 2017-10-08 DIAGNOSIS — C4491 Basal cell carcinoma of skin, unspecified: Secondary | ICD-10-CM | POA: Diagnosis not present

## 2017-10-08 DIAGNOSIS — C44319 Basal cell carcinoma of skin of other parts of face: Secondary | ICD-10-CM | POA: Diagnosis not present

## 2017-10-08 DIAGNOSIS — L578 Other skin changes due to chronic exposure to nonionizing radiation: Secondary | ICD-10-CM | POA: Diagnosis not present

## 2017-10-08 HISTORY — PX: MOHS SURGERY: SUR867

## 2017-10-09 ENCOUNTER — Encounter: Payer: Self-pay | Admitting: Family Medicine

## 2017-10-09 DIAGNOSIS — Z85828 Personal history of other malignant neoplasm of skin: Secondary | ICD-10-CM | POA: Insufficient documentation

## 2017-10-10 DIAGNOSIS — T8140XA Infection following a procedure, unspecified, initial encounter: Secondary | ICD-10-CM | POA: Diagnosis not present

## 2017-10-11 ENCOUNTER — Other Ambulatory Visit: Payer: Self-pay | Admitting: Family Medicine

## 2017-10-11 DIAGNOSIS — M5020 Other cervical disc displacement, unspecified cervical region: Secondary | ICD-10-CM

## 2017-10-11 MED ORDER — HYDROCODONE-ACETAMINOPHEN 5-325 MG PO TABS: 1.0000 | ORAL_TABLET | Freq: Four times a day (QID) | ORAL | 0 refills | Status: DC | PRN

## 2017-10-26 ENCOUNTER — Other Ambulatory Visit: Payer: Self-pay | Admitting: Family Medicine

## 2017-10-26 DIAGNOSIS — M5020 Other cervical disc displacement, unspecified cervical region: Secondary | ICD-10-CM

## 2017-10-26 MED ORDER — HYDROCODONE-ACETAMINOPHEN 5-325 MG PO TABS: 1.0000 | ORAL_TABLET | Freq: Four times a day (QID) | ORAL | 0 refills | Status: DC | PRN

## 2017-11-12 ENCOUNTER — Other Ambulatory Visit: Payer: Self-pay | Admitting: Family Medicine

## 2017-11-12 DIAGNOSIS — M5020 Other cervical disc displacement, unspecified cervical region: Secondary | ICD-10-CM

## 2017-11-12 MED ORDER — HYDROCODONE-ACETAMINOPHEN 5-325 MG PO TABS: 1.0000 | ORAL_TABLET | Freq: Four times a day (QID) | ORAL | 0 refills | Status: DC | PRN

## 2017-11-13 ENCOUNTER — Encounter: Payer: Self-pay | Admitting: Family Medicine

## 2017-11-13 ENCOUNTER — Ambulatory Visit: Payer: BLUE CROSS/BLUE SHIELD | Admitting: Family Medicine

## 2017-11-13 VITALS — BP 142/100 | HR 95 | Temp 98.4°F | Wt 241.4 lb

## 2017-11-13 DIAGNOSIS — I471 Supraventricular tachycardia: Secondary | ICD-10-CM

## 2017-11-13 DIAGNOSIS — M545 Low back pain: Secondary | ICD-10-CM | POA: Diagnosis not present

## 2017-11-13 DIAGNOSIS — I10 Essential (primary) hypertension: Secondary | ICD-10-CM

## 2017-11-13 DIAGNOSIS — G8929 Other chronic pain: Secondary | ICD-10-CM | POA: Diagnosis not present

## 2017-11-13 MED ORDER — VERAPAMIL HCL ER 180 MG PO TBCR: 180.0000 mg | EXTENDED_RELEASE_TABLET | Freq: Two times a day (BID) | ORAL | 3 refills | Status: DC

## 2017-11-13 MED ORDER — VERAPAMIL HCL ER 180 MG PO TBCR: 180.0000 mg | EXTENDED_RELEASE_TABLET | Freq: Every day | ORAL | 3 refills | Status: DC

## 2017-11-13 NOTE — Progress Notes (Signed)
Patient: Devin Parsons Male    DOB: 08/10/1970   47 y.o.   MRN: [id number] Visit Date: 11/13/2017  Today's Provider: Lelon Huh, MD   Chief Complaint  Patient presents with  . Hypertension  . Neck Pain   Subjective:    HPI  Hypertension, follow-up:  BP Readings from Last 3 Encounters:  11/13/17 (!) 142/100  07/10/17 130/88  04/12/17 (!) 138/96   Wt Readings from Last 3 Encounters:  11/13/17 241 lb 6.4 oz (109.5 kg)  07/10/17 236 lb (107 kg)  04/12/17 242 lb (109.8 kg)     He was last seen for hypertension 4  months ago.  BP at that visit was 130.88 Management changes since that visit include none. He reports good compliance with treatment. He is not having side effects.  He is exercising. He is adherent to low salt diet.   Outside blood pressures are . He is experiencing none.  Patient denies chest pain, chest pressure/discomfort, dyspnea, fatigue, irregular heart beat, lower extremity edema, palpitations and tachypnea.   Cardiovascular risk factors include hypertension.  Use of agents associated with hypertension: none.      Weight trend: stable Wt Readings from Last 3 Encounters:  11/13/17 241 lb 6.4 oz (109.5 kg)  07/10/17 236 lb (107 kg)  04/12/17 242 lb (109.8 kg)    Current diet: in general, a "healthy" diet     He also reports that the cost of verapamil recently went from $10 a month to over a hundred a month and wondering if we can change to something else.  ------------------------------------------------------------------------   Neck Pain  Patient states that his neck pain is not improving and seems to be getting worse. Patient is still taking Meloxicam, Ibuprofen and Norco for the pain.  No Known Allergies   Current Outpatient Medications:  .  buPROPion (WELLBUTRIN SR) 150 MG 12 hr tablet, One tablet twice a day, Disp: 60 tablet, Rfl: 11 .  HYDROcodone-acetaminophen (NORCO/VICODIN) 5-325 MG tablet, Take 1 tablet by mouth every  6 (six) hours as needed for moderate pain., Disp: 60 tablet, Rfl: 0 .  hyoscyamine (LEVSIN SL) 0.125 MG SL tablet, Place 1 tablet (0.125 mg total) under the tongue every 4 (four) hours as needed., Disp: 30 tablet, Rfl: 0 .  ibuprofen (ADVIL,MOTRIN) 200 MG tablet, Take 400-600 mg by mouth 2 (two) times daily as needed for headache or mild pain. , Disp: , Rfl:  .  losartan (COZAAR) 100 MG tablet, Take 1 tablet (100 mg total) by mouth daily., Disp: 90 tablet, Rfl: 2 .  meloxicam (MOBIC) 15 MG tablet, TAKE 1 TABLET BY MOUTH DAILY AS NEEDED FOR PAIN, REPLACES NABUMETONE, Disp: 30 tablet, Rfl: 5 .  Verapamil HCl CR 300 MG CP24, Take 1 capsule (300 mg total) by mouth daily., Disp: 30 each, Rfl: 11  Review of Systems  Constitutional: Negative for appetite change, chills and fever.  Respiratory: Negative for chest tightness, shortness of breath and wheezing.   Cardiovascular: Negative for chest pain and palpitations.  Gastrointestinal: Negative for abdominal pain, nausea and vomiting.    Social History   Tobacco Use  . Smoking status: Current Every Day Smoker    Packs/day: 0.50    Years: 31.00    Pack years: 15.50  . Smokeless tobacco: Never Used  Substance Use Topics  . Alcohol use: No    Alcohol/week: 0.0 standard drinks    Comment: formerly a heavy drinker in high school   Objective:  BP (!) 142/100 (BP Location: Right Arm, Patient Position: Sitting, Cuff Size: Normal)   Pulse 95   Temp 98.4 F (36.9 C) (Oral)   Wt 241 lb 6.4 oz (109.5 kg)   SpO2 96%   BMI 34.64 kg/m  Vitals:   11/13/17 1616  BP: (!) 142/100  Pulse: 95  Temp: 98.4 F (36.9 C)  TempSrc: Oral  SpO2: 96%  Weight: 241 lb 6.4 oz (109.5 kg)     Physical Exam   General Appearance:    Alert, cooperative, no distress  Eyes:    PERRL, conjunctiva/corneas clear, EOM's intact       Lungs:     Clear to auscultation bilaterally, respirations unlabored  Heart:    Regular rate and rhythm  Neurologic:   Awake, alert,  oriented x 3. No apparent focal neurological           defect.      EKG NSR.     Assessment & Plan:     1. SVT (supraventricular tachycardia) (HCC) Well controlled, but 300mg  24 hour verapamil much too expensive, and BP not optimally controlled. Will change to  - verapamil (CALAN-SR) 180 MG CR tablet; Take 1 tablet (180 mg total) by mouth 2 (two) times daily.  Dispense: 60 tablet; Refill: 3 Call if any problems filling prescription, otherwise follow up 6 months.   2. Essential hypertension Change to - verapamil (CALAN-SR) 180 MG CR tablet; Take 1 tablet (180 mg total) by mouth 2 (two) times daily.  Dispense: 60 tablet; Refill: 3  Continue losartan  3. Chronic low back pain without sciatica, unspecified back pain laterality Fairly well controlled continue current pain medications. Reviewed and patient signed opioid agreement today.        Lelon Huh, MD  [facility]

## 2017-11-14 ENCOUNTER — Telehealth: Payer: Self-pay | Admitting: Family Medicine

## 2017-11-14 ENCOUNTER — Encounter: Payer: Self-pay | Admitting: Family Medicine

## 2017-11-14 NOTE — Telephone Encounter (Signed)
Patient advised. Good RX card and print out left up front for pickup.

## 2017-11-14 NOTE — Telephone Encounter (Signed)
Per Colletta [place] at [employer] would be [employer] with out insurance. She is unable to see the price with [facility] with a [place] card, but states the price is pretty much accurate.

## 2017-11-14 NOTE — Telephone Encounter (Signed)
Please advise patient he should be able to get it at [facility] or [facility] at much lower cost if he uses [employer].

## 2017-11-14 NOTE — Telephone Encounter (Signed)
Can you check with [address] to see what cash price is for verapamil ER 180mg  twice a day for 60 tablets. Per [facility] should be around $18.00

## 2017-11-26 ENCOUNTER — Other Ambulatory Visit: Payer: Self-pay | Admitting: Family Medicine

## 2017-11-26 DIAGNOSIS — M5020 Other cervical disc displacement, unspecified cervical region: Secondary | ICD-10-CM

## 2017-11-26 MED ORDER — HYDROCODONE-ACETAMINOPHEN 5-325 MG PO TABS: 1.0000 | ORAL_TABLET | Freq: Four times a day (QID) | ORAL | 0 refills | Status: DC | PRN

## 2017-12-10 ENCOUNTER — Other Ambulatory Visit: Payer: Self-pay | Admitting: Family Medicine

## 2017-12-10 DIAGNOSIS — M5020 Other cervical disc displacement, unspecified cervical region: Secondary | ICD-10-CM

## 2017-12-10 MED ORDER — HYDROCODONE-ACETAMINOPHEN 5-325 MG PO TABS: 1.0000 | ORAL_TABLET | Freq: Four times a day (QID) | ORAL | 0 refills | Status: DC | PRN

## 2017-12-25 ENCOUNTER — Other Ambulatory Visit: Payer: Self-pay | Admitting: Family Medicine

## 2017-12-25 DIAGNOSIS — M5020 Other cervical disc displacement, unspecified cervical region: Secondary | ICD-10-CM

## 2017-12-26 MED ORDER — HYDROCODONE-ACETAMINOPHEN 5-325 MG PO TABS: 1.0000 | ORAL_TABLET | Freq: Four times a day (QID) | ORAL | 0 refills | Status: DC | PRN

## 2018-01-09 ENCOUNTER — Other Ambulatory Visit: Payer: Self-pay | Admitting: Family Medicine

## 2018-01-09 DIAGNOSIS — M5020 Other cervical disc displacement, unspecified cervical region: Secondary | ICD-10-CM

## 2018-01-09 MED ORDER — HYDROCODONE-ACETAMINOPHEN 5-325 MG PO TABS: 1.0000 | ORAL_TABLET | Freq: Four times a day (QID) | ORAL | 0 refills | Status: DC | PRN

## 2018-01-24 ENCOUNTER — Other Ambulatory Visit: Payer: Self-pay | Admitting: Family Medicine

## 2018-01-24 DIAGNOSIS — I10 Essential (primary) hypertension: Secondary | ICD-10-CM

## 2018-01-24 DIAGNOSIS — M5020 Other cervical disc displacement, unspecified cervical region: Secondary | ICD-10-CM

## 2018-01-25 MED ORDER — HYDROCODONE-ACETAMINOPHEN 5-325 MG PO TABS: 1.0000 | ORAL_TABLET | Freq: Four times a day (QID) | ORAL | 0 refills | Status: DC | PRN

## 2018-02-07 ENCOUNTER — Other Ambulatory Visit: Payer: Self-pay | Admitting: Family Medicine

## 2018-02-07 DIAGNOSIS — M5020 Other cervical disc displacement, unspecified cervical region: Secondary | ICD-10-CM

## 2018-02-07 MED ORDER — HYDROCODONE-ACETAMINOPHEN 5-325 MG PO TABS: 1.0000 | ORAL_TABLET | Freq: Four times a day (QID) | ORAL | 0 refills | Status: DC | PRN

## 2018-02-19 ENCOUNTER — Other Ambulatory Visit: Payer: Self-pay

## 2018-02-19 ENCOUNTER — Emergency Department: Payer: BLUE CROSS/BLUE SHIELD

## 2018-02-19 DIAGNOSIS — I10 Essential (primary) hypertension: Secondary | ICD-10-CM | POA: Insufficient documentation

## 2018-02-19 DIAGNOSIS — R002 Palpitations: Secondary | ICD-10-CM | POA: Insufficient documentation

## 2018-02-19 DIAGNOSIS — Z85828 Personal history of other malignant neoplasm of skin: Secondary | ICD-10-CM | POA: Diagnosis not present

## 2018-02-19 DIAGNOSIS — R000 Tachycardia, unspecified: Secondary | ICD-10-CM | POA: Diagnosis not present

## 2018-02-19 DIAGNOSIS — F17210 Nicotine dependence, cigarettes, uncomplicated: Secondary | ICD-10-CM | POA: Insufficient documentation

## 2018-02-19 DIAGNOSIS — R0602 Shortness of breath: Secondary | ICD-10-CM | POA: Diagnosis not present

## 2018-02-19 DIAGNOSIS — J40 Bronchitis, not specified as acute or chronic: Secondary | ICD-10-CM | POA: Diagnosis not present

## 2018-02-19 DIAGNOSIS — Z79899 Other long term (current) drug therapy: Secondary | ICD-10-CM | POA: Diagnosis not present

## 2018-02-19 DIAGNOSIS — R079 Chest pain, unspecified: Secondary | ICD-10-CM | POA: Diagnosis not present

## 2018-02-19 LAB — CBC
HCT: 43.5 % (ref 39.0–52.0)
Hemoglobin: 14.4 g/dL (ref 13.0–17.0)
MCH: 29.3 pg (ref 26.0–34.0)
MCHC: 33.1 g/dL (ref 30.0–36.0)
MCV: 88.4 fL (ref 80.0–100.0)
PLATELETS: 393 10*3/uL (ref 150–400)
RBC: 4.92 MIL/uL (ref 4.22–5.81)
RDW: 14 % (ref 11.5–15.5)
WBC: 13.1 10*3/uL — ABNORMAL HIGH (ref 4.0–10.5)
nRBC: 0 % (ref 0.0–0.2)

## 2018-02-19 LAB — COMPREHENSIVE METABOLIC PANEL
ALT: 32 U/L (ref 0–44)
AST: 18 U/L (ref 15–41)
Albumin: 4.5 g/dL (ref 3.5–5.0)
Alkaline Phosphatase: 69 U/L (ref 38–126)
Anion gap: 7 (ref 5–15)
BUN: 11 mg/dL (ref 6–20)
CHLORIDE: 108 mmol/L (ref 98–111)
CO2: 23 mmol/L (ref 22–32)
CREATININE: 0.81 mg/dL (ref 0.61–1.24)
Calcium: 9.3 mg/dL (ref 8.9–10.3)
GFR calc Af Amer: >60 mL/min (ref >60)
GFR calc non Af Amer: >60 mL/min (ref >60)
Glucose, Bld: 83 mg/dL (ref 70–99)
Potassium: 3.8 mmol/L (ref 3.5–5.1)
Sodium: 138 mmol/L (ref 135–145)
Total Bilirubin: 0.3 mg/dL (ref 0.3–1.2)
Total Protein: 7.8 g/dL (ref 6.5–8.1)

## 2018-02-19 LAB — TROPONIN I: Troponin I: <0.03 ng/mL (ref <0.03)

## 2018-02-19 NOTE — ED Triage Notes (Signed)
Pt in with co shob and palpitations that started this am, hx of the same but unsure of diagnosis.

## 2018-02-20 ENCOUNTER — Emergency Department: Payer: BLUE CROSS/BLUE SHIELD

## 2018-02-20 ENCOUNTER — Emergency Department
Admission: EM | Admit: 2018-02-20 | Discharge: 2018-02-20 | Disposition: A | Discharge status: Home / Self Care | Payer: BLUE CROSS/BLUE SHIELD | Attending: Emergency Medicine | Admitting: Emergency Medicine

## 2018-02-20 DIAGNOSIS — R079 Chest pain, unspecified: Secondary | ICD-10-CM | POA: Diagnosis not present

## 2018-02-20 DIAGNOSIS — J40 Bronchitis, not specified as acute or chronic: Secondary | ICD-10-CM

## 2018-02-20 DIAGNOSIS — R002 Palpitations: Secondary | ICD-10-CM

## 2018-02-20 LAB — TROPONIN I: Troponin I: <0.03 ng/mL (ref <0.03)

## 2018-02-20 MED ORDER — NICOTINE 21 MG/24HR TD PT24
21.0000 mg | MEDICATED_PATCH | Freq: Once | TRANSDERMAL | Status: DC
  Administered 2018-02-20: 21 mg via TRANSDERMAL
  Filled 2018-02-20: qty 1

## 2018-02-20 MED ORDER — AZITHROMYCIN 500 MG PO TABS: 500.0000 mg | ORAL_TABLET | Freq: Every day | ORAL | 0 refills | Status: DC

## 2018-02-20 MED ORDER — SODIUM CHLORIDE 0.9 % IV BOLUS
1000.0000 mL | Freq: Once | INTRAVENOUS | Status: AC
  Administered 2018-02-20: 1000 mL via INTRAVENOUS

## 2018-02-20 MED ORDER — IOHEXOL 350 MG/ML SOLN
100.0000 mL | Freq: Once | INTRAVENOUS | Status: AC | PRN
  Administered 2018-02-20: 100 mL via INTRAVENOUS

## 2018-02-21 ENCOUNTER — Ambulatory Visit: Payer: BLUE CROSS/BLUE SHIELD | Admitting: Family Medicine

## 2018-02-21 ENCOUNTER — Encounter: Payer: Self-pay | Admitting: Family Medicine

## 2018-02-21 ENCOUNTER — Other Ambulatory Visit: Payer: Self-pay | Admitting: Family Medicine

## 2018-02-21 VITALS — BP 142/118 | HR 89 | Temp 97.8°F | Resp 24 | Wt 242.0 lb

## 2018-02-21 DIAGNOSIS — I712 Thoracic aortic aneurysm, without rupture: Secondary | ICD-10-CM | POA: Diagnosis not present

## 2018-02-21 DIAGNOSIS — I10 Essential (primary) hypertension: Secondary | ICD-10-CM

## 2018-02-21 DIAGNOSIS — I7121 Aneurysm of the ascending aorta, without rupture: Secondary | ICD-10-CM

## 2018-02-21 DIAGNOSIS — M5020 Other cervical disc displacement, unspecified cervical region: Secondary | ICD-10-CM

## 2018-02-21 DIAGNOSIS — J40 Bronchitis, not specified as acute or chronic: Secondary | ICD-10-CM | POA: Diagnosis not present

## 2018-02-21 MED ORDER — HYDROCODONE-ACETAMINOPHEN 5-325 MG PO TABS: 1.0000 | ORAL_TABLET | Freq: Four times a day (QID) | ORAL | 0 refills | Status: DC | PRN

## 2018-02-21 MED ORDER — BUDESONIDE-FORMOTEROL FUMARATE 160-4.5 MCG/ACT IN AERO: 1.0000 | INHALATION_SPRAY | Freq: Two times a day (BID) | RESPIRATORY_TRACT | 0 refills | Status: DC

## 2018-02-21 MED ORDER — AZITHROMYCIN 500 MG PO TABS: 500.0000 mg | ORAL_TABLET | Freq: Every day | ORAL | 0 refills | Status: AC

## 2018-02-21 NOTE — Progress Notes (Signed)
Patient: Devin Parsons Male    DOB: [birth year]   48 y.o.   MRN: [id number] Visit Date: 02/21/2018  Today's Provider: Lelon Huh, MD   Chief Complaint  Patient presents with  . Follow-up   Subjective:     HPI  Follow up ER visit  Patient was seen in ER for palpitations and Bronchitis on 02/20/2018. Chest x ray was done, which showed mild bronchitis changes and started on 3 day 500mg   Azithromycin. CTA was also ordered, and patient was found to have a 4.2cm ascending aortic anerysm (annual screening was recommended). He had to sets of troponin which were negative and normal EKG aside from sinus tachycardia.    He reports good compliance with treatment. He reports this condition is Worse. Having some shortness of breath but no fevers or chest pains.   ------------------------------------------------------------------------------------   No Known Allergies   Current Outpatient Medications:  .  azithromycin (ZITHROMAX) 500 MG tablet, Take 1 tablet (500 mg total) by mouth daily for 3 days. Take 1 tablet daily for 3 days., Disp: 3 tablet, Rfl: 0 .  buPROPion (WELLBUTRIN SR) 150 MG 12 hr tablet, One tablet twice a day, Disp: 60 tablet, Rfl: 11 .  HYDROcodone-acetaminophen (NORCO/VICODIN) 5-325 MG tablet, Take 1 tablet by mouth every 6 (six) hours as needed for moderate pain., Disp: 60 tablet, Rfl: 0 .  hyoscyamine (LEVSIN SL) 0.125 MG SL tablet, Place 1 tablet (0.125 mg total) under the tongue every 4 (four) hours as needed., Disp: 30 tablet, Rfl: 0 .  ibuprofen (ADVIL,MOTRIN) 200 MG tablet, Take 400-600 mg by mouth 2 (two) times daily as needed for headache or mild pain. , Disp: , Rfl:  .  losartan (COZAAR) 100 MG tablet, TAKE 1 TABLET(100 MG) BY MOUTH DAILY, Disp: 90 tablet, Rfl: 4 .  meloxicam (MOBIC) 15 MG tablet, TAKE 1 TABLET BY MOUTH DAILY AS NEEDED FOR PAIN, REPLACES NABUMETONE, Disp: 30 tablet, Rfl: 5 .  verapamil (CALAN-SR) 180 MG CR tablet, Take 1 tablet (180 mg  total) by mouth 2 (two) times daily., Disp: 60 tablet, Rfl: 3  Review of Systems  Constitutional: Negative for appetite change, chills, diaphoresis, fatigue and fever.  Respiratory: Positive for shortness of breath and wheezing. Negative for chest tightness.   Cardiovascular: Negative for chest pain and palpitations.  Gastrointestinal: Negative for abdominal pain, nausea and vomiting.  Neurological: Positive for headaches.    Social History   Tobacco Use  . Smoking status: Current Every Day Smoker    Packs/day: 0.50    Years: 31.00    Pack years: 15.50  . Smokeless tobacco: Never Used  Substance Use Topics  . Alcohol use: No    Alcohol/week: 0.0 standard drinks    Comment: formerly a heavy drinker in high school      Objective:   BP (!) 142/118 (BP Location: Left Arm, Cuff Size: Large)   Pulse 89   Temp 97.8 F (36.6 C) (Oral)   Resp (!) 24 Comment: labored  Wt 242 lb (109.8 kg)   SpO2 97% Comment: room air  BMI 33.75 kg/m  Vitals:   02/21/18 1115 02/21/18 1119  BP: (!) 168/118 (!) 142/118  Pulse: 89   Resp: (!) 24   Temp: 97.8 F (36.6 C)   TempSrc: Oral   SpO2: 97%   Weight: 242 lb (109.8 kg)      Physical Exam   General Appearance:    Alert, cooperative, no distress  Eyes:  PERRL, conjunctiva/corneas clear, EOM's intact       Lungs:     Occasional expiratory wheeze, no rales, , respirations unlabored  Heart:    Regular rate and rhythm  Neurologic:   Awake, alert, oriented x 3. No apparent focal neurological           defect.          Assessment & Plan    1. Aneurysm of ascending aorta (HCC) Incidental finding on recent CTA. Asymptomatic. Annual CTA or MRA follow up per radiology recommendations. .   2. Bronchitis Add low dose inhaler samples. - budesonide-formoterol (SYMBICORT) 160-4.5 MCG/ACT inhaler; Inhale 1 puff into the lungs 2 (two) times daily for 30 days.  Dispense: 1 Inhaler; Refill: 0  Is on second day of azithromycin. May extend to 6  day total if not greatly improved over the next two days. Given printed prescription.  - azithromycin (ZITHROMAX) 500 MG tablet; Take 1 tablet (500 mg total) by mouth daily for 3 days. .  Dispense: 3 tablet; Refill: 0  3. Essential hypertension Usually controlled on current medication. Likely exacerbated by acute illness. He states he is compliant with medications. Is to return to recheck in a few weeks. May need additional agent or more potent ARB.      Lelon Huh, MD  [facility]

## 2018-02-21 NOTE — ED Provider Notes (Signed)
[facility] Emergency Department Provider Note  ____________________________________________   First MD Initiated Contact with Patient 02/20/18 0006     (approximate)  I have reviewed the triage vital signs and the nursing notes.   HISTORY  Chief Complaint Palpitations and Shortness of Breath    HPI Devin Parsons is a 48 y.o. male with below list of chronic medical conditions including SVT presents to the emergency department with dyspnea palpitations that the patient stated started this morning.  No chest pain at present.  Patient admits to history of the same without known gnosis..  Patient does admit to increasing cough.  Of note patient does use cigarettes daily.  Patient denies fever over  Past Medical History:  Diagnosis Date  . History of chicken pox   . SVT (supraventricular tachycardia) ([place])    a. echo 07/2014: EF 50-55%, no RWMA, GR1DD, PASP nl  . Syncope     Patient Active Problem List   Diagnosis Date Noted  . History of basal cell carcinoma (BCC) 10/09/2017  . Hypertension 09/21/2015  . 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 Back pain, chronic 11/19/2014  . Hyperlipidemia, mixed 11/19/2014  . Vestibular dizziness 11/19/2014  . Tobacco abuse   . SVT (supraventricular tachycardia) ([place])   . Syncope, near 08/13/2014  . Episodic mood disorder ([place]) 11/30/2007  . Displacement of cervical intervertebral disc without myelopathy 04/22/2007    Past Surgical History:  Procedure Laterality Date  . FRACTURE SURGERY    . KNEE SURGERY Left    Arthroscopy surgery performed  . MOHS SURGERY Right 10/08/2017   [place] Dr. Mickie Hillier  . MRI OF Lumbar spine  06/14/2004   Results: Herniated disc with facet reaction L5/S1. Done at [facility]  . TONSILLECTOMY AND ADENOIDECTOMY      Prior to Admission medications   Medication Sig Start Date End Date Taking? Authorizing Provider  azithromycin (ZITHROMAX) 500 MG tablet Take 1  tablet (500 mg total) by mouth daily for 3 days. Take 1 tablet daily for 3 days. 02/20/18 02/23/18  Gregor Hams, MD  buPROPion [facility] SR) 150 MG 12 hr tablet One tablet twice a day 04/12/17   Birdie Sons, MD  HYDROcodone-acetaminophen (NORCO/VICODIN) 5-325 MG tablet Take 1 tablet by mouth every 6 (six) hours as needed for moderate pain. 02/07/18   Birdie Sons, MD  hyoscyamine (LEVSIN SL) 0.125 MG SL tablet Place 1 tablet (0.125 mg total) under the tongue every 4 (four) hours as needed. 09/12/17   Birdie Sons, MD  ibuprofen (ADVIL,MOTRIN) 200 MG tablet Take 400-600 mg by mouth 2 (two) times daily as needed for headache or mild pain.     [provider]  losartan (COZAAR) 100 MG tablet TAKE 1 TABLET(100 MG) BY MOUTH DAILY 01/24/18   Birdie Sons, MD  meloxicam (MOBIC) 15 MG tablet TAKE 1 TABLET BY MOUTH DAILY AS NEEDED FOR PAIN, REPLACES NABUMETONE 10/11/17   Birdie Sons, MD  verapamil (CALAN-SR) 180 MG CR tablet Take 1 tablet (180 mg total) by mouth 2 (two) times daily. 11/13/17   Birdie Sons, MD    Allergies Patient has no known allergies.  Family History  Problem Relation Age of Onset  . Diabetes Mother     Social History Social History   Tobacco Use  . Smoking status: Current Every Day Smoker    Packs/day: 0.50    Years: 31.00    Pack years: 15.50  . Smokeless tobacco: Never Used  Substance Use Topics  . Alcohol use: No    Alcohol/week: 0.0 standard drinks    Comment: formerly a heavy drinker in high school  . Drug use: No    Review of Systems Constitutional: No fever/chills Eyes: No visual changes. ENT: No sore throat. Cardiovascular: Denies chest pain. Respiratory: Denies shortness of breath. Gastrointestinal: No abdominal pain.  No nausea, no vomiting.  No diarrhea.  No constipation. Genitourinary: Negative for dysuria. Musculoskeletal: Negative for neck pain.  Negative for back pain. Integumentary: Negative for  rash. Neurological: Negative for headaches, focal weakness or numbness.   ____________________________________________   PHYSICAL EXAM:  VITAL SIGNS: ED Triage Vitals [02/19/18 2154]  Enc Vitals Group     BP (!) 157/109     Pulse Rate (!) 106     Resp 20     Temp (!) 97.5 F (36.4 C)     Temp Source Oral     SpO2 98 %     Weight 108.9 kg (240 lb)     Height 1.803 m (5\' 11" )     Head Circumference      Peak Flow      Pain Score 5     Pain Loc      Pain Edu?      Excl. in [place]?     Constitutional: Alert and oriented. Well appearing and in no acute distress. Eyes: Conjunctivae are normal.  Mouth/Throat: Mucous membranes are moist.  Oropharynx non-erythematous. Neck: No stridor.  Cardiovascular: Normal rate, regular rhythm. Good peripheral circulation. Grossly normal heart sounds. Respiratory: Normal respiratory effort.  No retractions. Lungs CTAB. Gastrointestinal: Soft and nontender. No distention.   Musculoskeletal: No lower extremity tenderness nor edema. No gross deformities of extremities. Neurologic:  Normal speech and language. No gross focal neurologic deficits are appreciated.  Skin:  Skin is warm, dry and intact. No rash noted. Psychiatric: Mood and affect are normal. Speech and behavior are normal.  ____________________________________________   LABS (all labs ordered are listed, but only abnormal results are displayed)  Labs Reviewed  CBC - Abnormal; Notable for the following components:      Result Value   WBC 13.1 (*)    All other components within normal limits  COMPREHENSIVE METABOLIC PANEL  TROPONIN I  TROPONIN I   ____________________________________________  EKG  ED ECG REPORT I, [place] N Rebeckah Masih, the attending physician, personally viewed and interpreted this ECG.   Date: 02/21/2018  EKG Time: 9:57 PM  Rate: 110  Rhythm: Sinus tachycardia  Axis: Normal  Intervals: Normal  ST&T Change:  None  ____________________________________________  RADIOLOGY I,  N Weiland Tomich, personally viewed and evaluated these images (plain radiographs) as part of my medical decision making, as well as reviewing the written report by the radiologist.  ED MD interpretation: Two-view chest x-ray findings consistent with mild bronchitis per radiologist.  CT angiogram revealed 4.2 m a sending aortic aneurysm.   Official radiology report(s): No results found.   Procedures   ____________________________________________   INITIAL IMPRESSION / ASSESSMENT AND PLAN / ED COURSE  As part of my medical decision making, I reviewed the following data within the electronic MEDICAL RECORD NUMBER  48 year old male presenting with above-stated history and physical exam considered possibly CAD and as such EKG performed which revealed no evidence of ischemia or infarction.  Troponin negative x2.  Also consider possibility of pulmonary emboli given tachycardia dyspnea and discomfort as such CT performed which revealed no evidence of pulmonary emboli.  4.2 cm ascending aortic aneurysm  noted on CT.  Patient was informed of this and recommendation for annual screening.  Also informed the patient of warning signs that would warrant immediate return to the emergency department.  It is possible that the patient's dyspnea is secondary to mild bronchitis that was noted on chest x-ray and given the fact that the patient is a smoker azithromycin prescribed.  However encourage patient to follow-up with cardiology for further outpatient evaluation.    ____________________________________________  FINAL CLINICAL IMPRESSION(S) / ED DIAGNOSES  Final diagnoses:  Palpitations  Bronchitis     MEDICATIONS GIVEN DURING THIS VISIT:  Medications  iohexol (OMNIPAQUE) 350 MG/ML injection 100 mL (100 mLs Intravenous Contrast Given 02/20/18 0116)  sodium chloride 0.9 % bolus 1,000 mL (0 mLs Intravenous Stopped 02/20/18 0409)      ED Discharge Orders         Ordered    azithromycin (ZITHROMAX) 500 MG tablet  Daily     02/20/18 0343           Note:  This document was prepared using Dragon voice recognition software and may include unintentional dictation errors.    Gregor Hams, MD 02/21/18 [phone]

## 2018-02-21 NOTE — Patient Instructions (Addendum)
.   Please review the attached list of medications and notify my office if there are any errors.    You can fill the Zithromax refill if you are not much better when you finish the first 3 tablets.    You will need a CT angiogram in January 2021 to follow up on small aneurysm of the ascending aorta

## 2018-03-04 DIAGNOSIS — Z85828 Personal history of other malignant neoplasm of skin: Secondary | ICD-10-CM | POA: Diagnosis not present

## 2018-03-04 DIAGNOSIS — L570 Actinic keratosis: Secondary | ICD-10-CM | POA: Diagnosis not present

## 2018-03-04 DIAGNOSIS — Z08 Encounter for follow-up examination after completed treatment for malignant neoplasm: Secondary | ICD-10-CM | POA: Diagnosis not present

## 2018-03-07 ENCOUNTER — Other Ambulatory Visit: Payer: Self-pay | Admitting: Family Medicine

## 2018-03-07 DIAGNOSIS — M5020 Other cervical disc displacement, unspecified cervical region: Secondary | ICD-10-CM

## 2018-03-07 MED ORDER — HYDROCODONE-ACETAMINOPHEN 5-325 MG PO TABS: 1.0000 | ORAL_TABLET | Freq: Four times a day (QID) | ORAL | 0 refills | Status: DC | PRN

## 2018-03-15 ENCOUNTER — Ambulatory Visit: Payer: Self-pay | Admitting: Family Medicine

## 2018-03-18 ENCOUNTER — Encounter: Payer: Self-pay | Admitting: Family Medicine

## 2018-03-21 ENCOUNTER — Other Ambulatory Visit: Payer: Self-pay | Admitting: Family Medicine

## 2018-03-21 DIAGNOSIS — I10 Essential (primary) hypertension: Secondary | ICD-10-CM

## 2018-03-21 DIAGNOSIS — M5020 Other cervical disc displacement, unspecified cervical region: Secondary | ICD-10-CM

## 2018-03-21 DIAGNOSIS — I471 Supraventricular tachycardia: Secondary | ICD-10-CM

## 2018-03-21 MED ORDER — HYDROCODONE-ACETAMINOPHEN 5-325 MG PO TABS: 1.0000 | ORAL_TABLET | Freq: Four times a day (QID) | ORAL | 0 refills | Status: DC | PRN

## 2018-03-22 ENCOUNTER — Ambulatory Visit: Payer: Self-pay | Admitting: Family Medicine

## 2018-04-04 ENCOUNTER — Other Ambulatory Visit: Payer: Self-pay | Admitting: Family Medicine

## 2018-04-04 DIAGNOSIS — M5020 Other cervical disc displacement, unspecified cervical region: Secondary | ICD-10-CM

## 2018-04-04 NOTE — Telephone Encounter (Signed)
Patient is calling office requesting refill on Hydrocodone-Acetaminophen sent to [facility] in [place]

## 2018-04-05 MED ORDER — HYDROCODONE-ACETAMINOPHEN 5-325 MG PO TABS: 1.0000 | ORAL_TABLET | Freq: Four times a day (QID) | ORAL | 0 refills | Status: DC | PRN

## 2018-04-17 ENCOUNTER — Other Ambulatory Visit: Payer: Self-pay | Admitting: Family Medicine

## 2018-04-17 DIAGNOSIS — M5020 Other cervical disc displacement, unspecified cervical region: Secondary | ICD-10-CM

## 2018-04-17 MED ORDER — HYDROCODONE-ACETAMINOPHEN 5-325 MG PO TABS: 1.0000 | ORAL_TABLET | Freq: Four times a day (QID) | ORAL | 0 refills | Status: DC | PRN

## 2018-04-30 ENCOUNTER — Other Ambulatory Visit: Payer: Self-pay | Admitting: Family Medicine

## 2018-04-30 ENCOUNTER — Encounter: Payer: Self-pay | Admitting: Family Medicine

## 2018-04-30 DIAGNOSIS — Z720 Tobacco use: Secondary | ICD-10-CM

## 2018-04-30 DIAGNOSIS — M5020 Other cervical disc displacement, unspecified cervical region: Secondary | ICD-10-CM

## 2018-04-30 MED ORDER — HYDROCODONE-ACETAMINOPHEN 5-325 MG PO TABS: 1.0000 | ORAL_TABLET | Freq: Four times a day (QID) | ORAL | 0 refills | Status: DC | PRN

## 2018-05-13 ENCOUNTER — Other Ambulatory Visit: Payer: Self-pay | Admitting: Family Medicine

## 2018-05-13 DIAGNOSIS — M5020 Other cervical disc displacement, unspecified cervical region: Secondary | ICD-10-CM

## 2018-05-15 ENCOUNTER — Ambulatory Visit: Payer: Self-pay | Admitting: Family Medicine

## 2018-05-16 ENCOUNTER — Other Ambulatory Visit: Payer: Self-pay | Admitting: Family Medicine

## 2018-05-16 DIAGNOSIS — M5020 Other cervical disc displacement, unspecified cervical region: Secondary | ICD-10-CM

## 2018-05-16 MED ORDER — HYDROCODONE-ACETAMINOPHEN 5-325 MG PO TABS: 1.0000 | ORAL_TABLET | Freq: Four times a day (QID) | ORAL | 0 refills | Status: DC | PRN

## 2018-05-17 ENCOUNTER — Ambulatory Visit: Payer: Self-pay | Admitting: Family Medicine

## 2018-05-30 ENCOUNTER — Other Ambulatory Visit: Payer: Self-pay | Admitting: Family Medicine

## 2018-05-30 DIAGNOSIS — M5020 Other cervical disc displacement, unspecified cervical region: Secondary | ICD-10-CM

## 2018-05-30 MED ORDER — HYDROCODONE-ACETAMINOPHEN 5-325 MG PO TABS: 1.0000 | ORAL_TABLET | Freq: Four times a day (QID) | ORAL | 0 refills | Status: DC | PRN

## 2018-06-13 ENCOUNTER — Other Ambulatory Visit: Payer: Self-pay | Admitting: Family Medicine

## 2018-06-13 DIAGNOSIS — M5020 Other cervical disc displacement, unspecified cervical region: Secondary | ICD-10-CM

## 2018-06-13 MED ORDER — HYDROCODONE-ACETAMINOPHEN 5-325 MG PO TABS: 1.0000 | ORAL_TABLET | Freq: Four times a day (QID) | ORAL | 0 refills | Status: DC | PRN

## 2018-06-13 NOTE — Telephone Encounter (Signed)
Please review

## 2018-06-27 ENCOUNTER — Other Ambulatory Visit: Payer: Self-pay | Admitting: Family Medicine

## 2018-06-27 DIAGNOSIS — M5020 Other cervical disc displacement, unspecified cervical region: Secondary | ICD-10-CM

## 2018-06-27 MED ORDER — HYDROCODONE-ACETAMINOPHEN 5-325 MG PO TABS: 1.0000 | ORAL_TABLET | Freq: Four times a day (QID) | ORAL | 0 refills | Status: DC | PRN

## 2018-06-28 ENCOUNTER — Ambulatory Visit: Payer: Self-pay | Admitting: Family Medicine

## 2018-07-08 ENCOUNTER — Encounter: Payer: Self-pay | Admitting: Family Medicine

## 2018-07-08 ENCOUNTER — Other Ambulatory Visit: Payer: Self-pay

## 2018-07-08 ENCOUNTER — Ambulatory Visit: Payer: BC Managed Care – PPO | Admitting: Family Medicine

## 2018-07-08 VITALS — BP 152/102 | HR 82 | Temp 98.4°F | Resp 16 | Wt 242.0 lb

## 2018-07-08 DIAGNOSIS — I471 Supraventricular tachycardia: Secondary | ICD-10-CM

## 2018-07-08 DIAGNOSIS — I10 Essential (primary) hypertension: Secondary | ICD-10-CM | POA: Diagnosis not present

## 2018-07-08 MED ORDER — LOSARTAN POTASSIUM-HCTZ 100-12.5 MG PO TABS: 1.0000 | ORAL_TABLET | Freq: Every day | ORAL | 1 refills | Status: DC

## 2018-07-08 NOTE — Patient Instructions (Signed)
.   Please review the attached list of medications and notify my office if there are any errors.   . Please bring all of your medications to every appointment so we can make sure that our medication list is the same as yours.   

## 2018-07-08 NOTE — Progress Notes (Signed)
Patient: Devin Parsons Male    DOB: 24-Dec-1970   48 y.o.   MRN: [id number] Visit Date: 07/08/2018  Today's Provider: Lelon Huh, MD   Chief Complaint  Patient presents with  . Hypertension  . Pain  . Hyperlipidemia   Subjective:     HPI  Follow up for Chronic pain:  The patient was last seen for this 8 months ago. Changes made at last visit include none.  He reports good compliance with treatment. He feels that condition is Unchanged. He is not having side effects.   ------------------------------------------------------------------------------------  Hypertension, follow-up:  BP Readings from Last 3 Encounters:  07/08/18 (!) 152/102  02/21/18 (!) 142/118  02/20/18 (!) 161/112    He was last seen for hypertension 5 months ago.  BP at that visit was 168/118. Management since that visit includes no changes; likely exacerbated by acute illness. He reports good compliance with treatment. He is not having side effects.  He is not exercising. He is adherent to low salt diet.   Outside blood pressures are not being checked. He is experiencing none.  Patient denies chest pain, chest pressure/discomfort, claudication, dyspnea, exertional chest pressure/discomfort, fatigue, irregular heart beat, lower extremity edema, near-syncope, orthopnea, palpitations, paroxysmal nocturnal dyspnea, syncope and tachypnea.   Cardiovascular risk factors include hypertension and male gender.  Use of agents associated with hypertension: none.     Weight trend: stable Wt Readings from Last 3 Encounters:  07/08/18 242 lb (109.8 kg)  02/21/18 242 lb (109.8 kg)  02/19/18 240 lb (108.9 kg)    Current diet: well balanced  ------------------------------------------------------------------------  Lipid/Cholesterol, Follow-up:   Last seen for this1 years ago.  Management changes since that visit include none. . Last Lipid Panel:    Component Value Date/Time   CHOL 180  04/12/2017 1138   TRIG 202 (H) 04/12/2017 1138   HDL 28 (L) 04/12/2017 1138   CHOLHDL 6.4 (H) 04/12/2017 1138   CHOLHDL 7.2 08/14/2014 0603   VLDL 26 08/14/2014 0603   LDLCALC 112 (H) 04/12/2017 1138    Risk factors for vascular disease include hypercholesterolemia and hypertension  He reports good compliance with treatment. He is not having side effects.  Current symptoms include none and have been stable. Weight trend: stable Prior visit with dietician: no Current diet: well balanced Current exercise: none  Wt Readings from Last 3 Encounters:  07/08/18 242 lb (109.8 kg)  02/21/18 242 lb (109.8 kg)  02/19/18 240 lb (108.9 kg)    -------------------------------------------------------------------  No Known Allergies   Current Outpatient Medications:  .  buPROPion (WELLBUTRIN SR) 150 MG 12 hr tablet, TAKE 1 TABLET BY MOUTH TWICE DAILY, Disp: 60 tablet, Rfl: 11 .  HYDROcodone-acetaminophen (NORCO/VICODIN) 5-325 MG tablet, Take 1 tablet by mouth every 6 (six) hours as needed for moderate pain., Disp: 60 tablet, Rfl: 0 .  hyoscyamine (LEVSIN SL) 0.125 MG SL tablet, Place 1 tablet (0.125 mg total) under the tongue every 4 (four) hours as needed., Disp: 30 tablet, Rfl: 0 .  ibuprofen (ADVIL,MOTRIN) 200 MG tablet, Take 400-600 mg by mouth 2 (two) times daily as needed for headache or mild pain. , Disp: , Rfl:  .  losartan (COZAAR) 100 MG tablet, TAKE 1 TABLET(100 MG) BY MOUTH DAILY, Disp: 90 tablet, Rfl: 4 .  meloxicam (MOBIC) 15 MG tablet, TAKE 1 TABLET BY MOUTH DAILY AS NEEDED FOR PAIN, REPLACES NABUMETONE, Disp: 30 tablet, Rfl: 5 .  verapamil (CALAN-SR) 180 MG CR tablet, Take 1  tablet (180 mg total) by mouth 2 (two) times daily., Disp: 60 tablet, Rfl: 11  Review of Systems  Constitutional: Negative for appetite change, chills and fever.  Respiratory: Negative for chest tightness, shortness of breath and wheezing.   Cardiovascular: Negative for chest pain and palpitations.   Gastrointestinal: Negative for abdominal pain, nausea and vomiting.    Social History   Tobacco Use  . Smoking status: Current Every Day Smoker    Packs/day: 1.00    Years: 31.00    Pack years: 31.00  . Smokeless tobacco: Never Used  Substance Use Topics  . Alcohol use: No    Alcohol/week: 0.0 standard drinks    Comment: formerly a heavy drinker in high school      Objective:   BP (!) 152/102 (BP Location: Right Arm)   Pulse 82   Temp 98.4 F (36.9 C) (Oral)   Resp 16   Wt 242 lb (109.8 kg)   SpO2 98% Comment: room air  BMI 33.75 kg/m  Vitals:   07/08/18 1509 07/08/18 1511  BP: (!) 145/98 (!) 152/102  Pulse: 82   Resp: 16   Temp: 98.4 F (36.9 C)   TempSrc: Oral   SpO2: 98%   Weight: 242 lb (109.8 kg)      Physical Exam  General appearance: alert, well developed, well nourished, cooperative and in no distress Head: Normocephalic, without obvious abnormality, atraumatic Respiratory: Respirations even and unlabored, normal respiratory rate Extremities: No gross deformities Skin: Skin color, texture, turgor normal. No rashes seen  Psych: Appropriate mood and affect. Neurologic: Mental status: Alert, oriented to person, place, and time, thought content appropriate.     Assessment & Plan    1. Essential hypertension Improved, but not too goal. Change losartan to losartan-hctz 100-12.5  2. SVT (supraventricular tachycardia) (HCC) Well controlled. Completely asymptomatic on current dose of verapamil.      Lelon Huh, MD  [facility]

## 2018-07-11 ENCOUNTER — Other Ambulatory Visit: Payer: Self-pay | Admitting: Family Medicine

## 2018-07-11 DIAGNOSIS — M5020 Other cervical disc displacement, unspecified cervical region: Secondary | ICD-10-CM

## 2018-07-11 MED ORDER — HYDROCODONE-ACETAMINOPHEN 5-325 MG PO TABS: 1.0000 | ORAL_TABLET | Freq: Four times a day (QID) | ORAL | 0 refills | Status: DC | PRN

## 2018-07-11 NOTE — Telephone Encounter (Signed)
Please review

## 2018-07-11 NOTE — Telephone Encounter (Signed)
Sent to me by mistake 

## 2018-07-25 ENCOUNTER — Encounter: Payer: Self-pay | Admitting: Family Medicine

## 2018-07-25 ENCOUNTER — Other Ambulatory Visit: Payer: Self-pay | Admitting: Family Medicine

## 2018-07-25 DIAGNOSIS — M5020 Other cervical disc displacement, unspecified cervical region: Secondary | ICD-10-CM

## 2018-07-25 MED ORDER — HYDROCODONE-ACETAMINOPHEN 5-325 MG PO TABS: 1.0000 | ORAL_TABLET | Freq: Four times a day (QID) | ORAL | 0 refills | Status: DC | PRN

## 2018-08-08 ENCOUNTER — Encounter: Payer: Self-pay | Admitting: Family Medicine

## 2018-08-08 ENCOUNTER — Other Ambulatory Visit: Payer: Self-pay | Admitting: Family Medicine

## 2018-08-08 DIAGNOSIS — M5020 Other cervical disc displacement, unspecified cervical region: Secondary | ICD-10-CM

## 2018-08-08 NOTE — Telephone Encounter (Signed)
Last dispensed 15 day supply 1 day early on 07-25-2018

## 2018-08-09 DIAGNOSIS — H5203 Hypermetropia, bilateral: Secondary | ICD-10-CM | POA: Diagnosis not present

## 2018-08-09 MED ORDER — HYDROCODONE-ACETAMINOPHEN 5-325 MG PO TABS: 1.0000 | ORAL_TABLET | Freq: Four times a day (QID) | ORAL | 0 refills | Status: DC | PRN

## 2018-08-22 ENCOUNTER — Other Ambulatory Visit: Payer: Self-pay | Admitting: Family Medicine

## 2018-08-22 DIAGNOSIS — M5020 Other cervical disc displacement, unspecified cervical region: Secondary | ICD-10-CM

## 2018-08-22 MED ORDER — HYDROCODONE-ACETAMINOPHEN 5-325 MG PO TABS: 1.0000 | ORAL_TABLET | Freq: Four times a day (QID) | ORAL | 0 refills | Status: DC | PRN

## 2018-08-22 NOTE — Telephone Encounter (Signed)
Hope all ios well with Epic.

## 2018-09-05 ENCOUNTER — Other Ambulatory Visit: Payer: Self-pay | Admitting: Family Medicine

## 2018-09-05 DIAGNOSIS — M5020 Other cervical disc displacement, unspecified cervical region: Secondary | ICD-10-CM

## 2018-09-05 MED ORDER — HYOSCYAMINE SULFATE 0.125 MG SL SUBL: 0.1250 mg | SUBLINGUAL_TABLET | SUBLINGUAL | 0 refills | Status: DC | PRN

## 2018-09-05 MED ORDER — HYDROCODONE-ACETAMINOPHEN 5-325 MG PO TABS: 1.0000 | ORAL_TABLET | Freq: Four times a day (QID) | ORAL | 0 refills | Status: DC | PRN

## 2018-09-19 ENCOUNTER — Other Ambulatory Visit: Payer: Self-pay | Admitting: Family Medicine

## 2018-09-19 DIAGNOSIS — M5020 Other cervical disc displacement, unspecified cervical region: Secondary | ICD-10-CM

## 2018-09-19 MED ORDER — HYDROCODONE-ACETAMINOPHEN 5-325 MG PO TABS: 1.0000 | ORAL_TABLET | Freq: Four times a day (QID) | ORAL | 0 refills | Status: DC | PRN

## 2018-10-03 ENCOUNTER — Other Ambulatory Visit: Payer: Self-pay | Admitting: Family Medicine

## 2018-10-03 DIAGNOSIS — M5020 Other cervical disc displacement, unspecified cervical region: Secondary | ICD-10-CM

## 2018-10-03 MED ORDER — HYDROCODONE-ACETAMINOPHEN 5-325 MG PO TABS: 1.0000 | ORAL_TABLET | Freq: Four times a day (QID) | ORAL | 0 refills | Status: DC | PRN

## 2018-10-09 ENCOUNTER — Other Ambulatory Visit: Payer: Self-pay

## 2018-10-09 ENCOUNTER — Ambulatory Visit: Payer: BC Managed Care – PPO | Admitting: Family Medicine

## 2018-10-09 ENCOUNTER — Encounter: Payer: Self-pay | Admitting: Family Medicine

## 2018-10-09 VITALS — BP 122/94 | HR 95 | Temp 97.3°F | Resp 16 | Wt 241.8 lb

## 2018-10-09 DIAGNOSIS — E782 Mixed hyperlipidemia: Secondary | ICD-10-CM | POA: Diagnosis not present

## 2018-10-09 DIAGNOSIS — I10 Essential (primary) hypertension: Secondary | ICD-10-CM

## 2018-10-09 DIAGNOSIS — Z125 Encounter for screening for malignant neoplasm of prostate: Secondary | ICD-10-CM

## 2018-10-09 NOTE — Progress Notes (Signed)
Patient: Devin Parsons Male    DOB: September 28, 1970   48 y.o.   MRN: [id number] Visit Date: 10/09/2018  Today's Provider: Lelon Huh, MD   Chief Complaint  Patient presents with  . Hypertension   Subjective:     HPI  Hypertension, follow-up:  BP Readings from Last 3 Encounters:  10/09/18 (!) 122/94  07/08/18 (!) 152/102  02/21/18 (!) 142/118    He was last seen for hypertension 3 months ago.  BP at that visit was 152/102. Management changes since that visit include switching for Losartan to Losartan-HCTZ 100-12.5mg . He reports excellent compliance with treatment. He is not having side effects.  He is not exercising. He is adherent to low salt diet.   Outside blood pressures are not being checked. He is experiencing none.  Patient denies chest pain, chest pressure/discomfort, claudication, dyspnea, exertional chest pressure/discomfort, fatigue, irregular heart beat, lower extremity edema, near-syncope, orthopnea, palpitations, paroxysmal nocturnal dyspnea, syncope and tachypnea.   Cardiovascular risk factors include hypertension and male gender.  Use of agents associated with hypertension: none.     Weight trend: stable Wt Readings from Last 3 Encounters:  10/09/18 241 lb 12.8 oz (109.7 kg)  07/08/18 242 lb (109.8 kg)  02/21/18 242 lb (109.8 kg)    Current diet: well balanced  ------------------------------------------------------------------------  No Known Allergies   Current Outpatient Medications:  .  buPROPion (WELLBUTRIN SR) 150 MG 12 hr tablet, TAKE 1 TABLET BY MOUTH TWICE DAILY, Disp: 60 tablet, Rfl: 11 .  HYDROcodone-acetaminophen (NORCO/VICODIN) 5-325 MG tablet, Take 1 tablet by mouth every 6 (six) hours as needed for moderate pain., Disp: 60 tablet, Rfl: 0 .  hyoscyamine (LEVSIN SL) 0.125 MG SL tablet, Place 1 tablet (0.125 mg total) under the tongue every 4 (four) hours as needed., Disp: 30 tablet, Rfl: 0 .  ibuprofen (ADVIL,MOTRIN) 200 MG  tablet, Take 400-600 mg by mouth 2 (two) times daily as needed for headache or mild pain. , Disp: , Rfl:  .  losartan-hydrochlorothiazide (HYZAAR) 100-12.5 MG tablet, Take 1 tablet by mouth daily., Disp: 90 tablet, Rfl: 1 .  meloxicam (MOBIC) 15 MG tablet, TAKE 1 TABLET BY MOUTH DAILY AS NEEDED FOR PAIN, REPLACES NABUMETONE, Disp: 30 tablet, Rfl: 5 .  verapamil (CALAN-SR) 180 MG CR tablet, Take 1 tablet (180 mg total) by mouth 2 (two) times daily., Disp: 60 tablet, Rfl: 11  Review of Systems  Constitutional: Negative for appetite change, chills and fever.  Respiratory: Negative for chest tightness, shortness of breath and wheezing.   Cardiovascular: Negative for chest pain and palpitations.  Gastrointestinal: Negative for abdominal pain, nausea and vomiting.    Social History   Tobacco Use  . Smoking status: Current Every Day Smoker    Packs/day: 1.00    Years: 31.00    Pack years: 31.00  . Smokeless tobacco: Never Used  Substance Use Topics  . Alcohol use: No    Alcohol/week: 0.0 standard drinks    Comment: formerly a heavy drinker in high school      Objective:   BP (!) 122/94   Pulse 95   Temp (!) 97.3 F (36.3 C) (Oral)   Resp 16   Wt 241 lb 12.8 oz (109.7 kg)   SpO2 96%   BMI 33.72 kg/m  Vitals:   10/09/18 0812  BP: (!) 122/94  Pulse: 95  Resp: 16  Temp: (!) 97.3 F (36.3 C)  TempSrc: Oral  SpO2: 96%  Weight: 241 lb 12.8 oz (  109.7 kg)  Body mass index is 33.72 kg/m.   Physical Exam   General appearance: alert, well developed, well nourished, cooperative and in no distress Head: Normocephalic, without obvious abnormality, atraumatic Respiratory: Respirations even and unlabored, normal respiratory rate Extremities: All extremities are intact.  Skin: Skin color, texture, turgor normal. No rashes seen  Psych: Appropriate mood and affect. Neurologic: Mental status: Alert, oriented to person, place, and time, thought content appropriate.      Assessment  & Plan    1. Essential hypertension Much better with change from losartan to losartan-hctz. Continue current medications.    2. Hyperlipidemia, mixed  - Comprehensive metabolic panel - Lipid panel  3. Prostate cancer screening  - PSA  He anticipates getting flu shot at work.   The entirety of the information documented in the History of Present Illness, Review of Systems and Physical Exam were personally obtained by me. Portions of this information were initially documented by Minette Headland, CMA and reviewed by me for thoroughness and accuracy.      Lelon Huh, MD  [facility]

## 2018-10-09 NOTE — Patient Instructions (Signed)
.   Please review the attached list of medications and notify my office if there are any errors.   . Please bring all of your medications to every appointment so we can make sure that our medication list is the same as yours.   . It is especially important to get the annual flu vaccine this year. If you haven't had it already, please go to your pharmacy or call the office as soon as possible to schedule you flu shot.  

## 2018-10-10 ENCOUNTER — Telehealth: Payer: Self-pay

## 2018-10-10 LAB — LIPID PANEL
Chol/HDL Ratio: 6 ratio — ABNORMAL HIGH (ref 0.0–5.0)
Cholesterol, Total: 181 mg/dL (ref 100–199)
HDL: 30 mg/dL — ABNORMAL LOW (ref >39)
LDL Chol Calc (NIH): 112 mg/dL — ABNORMAL HIGH (ref 0–99)
Triglycerides: 223 mg/dL — ABNORMAL HIGH (ref 0–149)
VLDL Cholesterol Cal: 39 mg/dL (ref 5–40)

## 2018-10-10 LAB — COMPREHENSIVE METABOLIC PANEL
ALT: 32 IU/L (ref 0–44)
AST: 17 IU/L (ref 0–40)
Albumin/Globulin Ratio: 1.5 (ref 1.2–2.2)
Albumin: 4.6 g/dL (ref 4.0–5.0)
Alkaline Phosphatase: 77 IU/L (ref 39–117)
BUN/Creatinine Ratio: 12 (ref 9–20)
BUN: 11 mg/dL (ref 6–24)
Bilirubin Total: 0.3 mg/dL (ref 0.0–1.2)
CO2: 23 mmol/L (ref 20–29)
Calcium: 10.1 mg/dL (ref 8.7–10.2)
Chloride: 102 mmol/L (ref 96–106)
Creatinine, Ser: 0.9 mg/dL (ref 0.76–1.27)
GFR calc Af Amer: 115 mL/min/{1.73_m2} (ref >59)
GFR calc non Af Amer: 99 mL/min/{1.73_m2} (ref >59)
Globulin, Total: 3 g/dL (ref 1.5–4.5)
Glucose: 88 mg/dL (ref 65–99)
Potassium: 4.2 mmol/L (ref 3.5–5.2)
Sodium: 139 mmol/L (ref 134–144)
Total Protein: 7.6 g/dL (ref 6.0–8.5)

## 2018-10-10 LAB — PSA: Prostate Specific Ag, Serum: 1.3 ng/mL (ref 0.0–4.0)

## 2018-10-10 NOTE — Telephone Encounter (Signed)
LMTCB 10/10/2018  Thanks,   -Mickel Baas

## 2018-10-10 NOTE — Telephone Encounter (Signed)
-----   Message from Birdie Sons, MD sent at 10/10/2018  8:12 AM EDT ----- LDL cholesterol is high at 112, needs to be under 80 to prevent heart disease. HLD cholesterol is low at 30, needs to be over 40.  Need to start rosuvastatin 10mg  once a day, #30, rf x 3.  Rest of labs are good.

## 2018-10-17 ENCOUNTER — Other Ambulatory Visit: Payer: Self-pay | Admitting: Family Medicine

## 2018-10-17 DIAGNOSIS — M5020 Other cervical disc displacement, unspecified cervical region: Secondary | ICD-10-CM

## 2018-10-17 MED ORDER — HYDROCODONE-ACETAMINOPHEN 5-325 MG PO TABS: 1.0000 | ORAL_TABLET | Freq: Four times a day (QID) | ORAL | 0 refills | Status: DC | PRN

## 2018-10-31 ENCOUNTER — Other Ambulatory Visit: Payer: Self-pay | Admitting: Family Medicine

## 2018-10-31 DIAGNOSIS — M5020 Other cervical disc displacement, unspecified cervical region: Secondary | ICD-10-CM

## 2018-10-31 MED ORDER — HYDROCODONE-ACETAMINOPHEN 5-325 MG PO TABS: 1.0000 | ORAL_TABLET | Freq: Four times a day (QID) | ORAL | 0 refills | Status: DC | PRN

## 2018-11-14 ENCOUNTER — Other Ambulatory Visit: Payer: Self-pay | Admitting: Family Medicine

## 2018-11-14 DIAGNOSIS — M5020 Other cervical disc displacement, unspecified cervical region: Secondary | ICD-10-CM

## 2018-11-14 MED ORDER — HYDROCODONE-ACETAMINOPHEN 5-325 MG PO TABS: 1.0000 | ORAL_TABLET | Freq: Four times a day (QID) | ORAL | 0 refills | Status: DC | PRN

## 2018-11-18 ENCOUNTER — Encounter: Payer: Self-pay | Admitting: Family Medicine

## 2018-11-19 MED ORDER — PAROXETINE HCL 20 MG PO TABS: 20.0000 mg | ORAL_TABLET | Freq: Every day | ORAL | 12 refills | Status: DC

## 2018-11-28 ENCOUNTER — Other Ambulatory Visit: Payer: Self-pay | Admitting: Family Medicine

## 2018-11-28 DIAGNOSIS — M5020 Other cervical disc displacement, unspecified cervical region: Secondary | ICD-10-CM

## 2018-11-28 MED ORDER — HYDROCODONE-ACETAMINOPHEN 5-325 MG PO TABS: 1.0000 | ORAL_TABLET | Freq: Four times a day (QID) | ORAL | 0 refills | Status: DC | PRN

## 2018-12-12 ENCOUNTER — Other Ambulatory Visit: Payer: Self-pay | Admitting: Family Medicine

## 2018-12-12 DIAGNOSIS — Z20828 Contact with and (suspected) exposure to other viral communicable diseases: Secondary | ICD-10-CM | POA: Diagnosis not present

## 2018-12-12 DIAGNOSIS — M5020 Other cervical disc displacement, unspecified cervical region: Secondary | ICD-10-CM

## 2018-12-13 MED ORDER — HYDROCODONE-ACETAMINOPHEN 5-325 MG PO TABS: 1.0000 | ORAL_TABLET | Freq: Four times a day (QID) | ORAL | 0 refills | Status: DC | PRN

## 2018-12-13 NOTE — Telephone Encounter (Signed)
Pt following up on refill request for HYDROcodone-acetaminophen (NORCO/VICODIN) 5-325 MG tablet  Pt states this is a 2 wk Rx  [id number] - [place], [place] [address] AT [facility] [address]

## 2018-12-24 ENCOUNTER — Encounter: Payer: Self-pay | Admitting: Family Medicine

## 2018-12-24 DIAGNOSIS — U071 COVID-19: Secondary | ICD-10-CM | POA: Insufficient documentation

## 2018-12-24 HISTORY — DX: COVID-19: U07.1

## 2018-12-26 ENCOUNTER — Other Ambulatory Visit: Payer: Self-pay | Admitting: Family Medicine

## 2018-12-26 DIAGNOSIS — M5020 Other cervical disc displacement, unspecified cervical region: Secondary | ICD-10-CM

## 2018-12-26 MED ORDER — HYDROCODONE-ACETAMINOPHEN 5-325 MG PO TABS: 1.0000 | ORAL_TABLET | Freq: Four times a day (QID) | ORAL | 0 refills | Status: DC | PRN

## 2018-12-26 NOTE — Telephone Encounter (Signed)
See Rx request (I'm told that PCP is checking inbox occasionally)

## 2019-01-09 ENCOUNTER — Other Ambulatory Visit: Payer: Self-pay | Admitting: Family Medicine

## 2019-01-09 DIAGNOSIS — M5020 Other cervical disc displacement, unspecified cervical region: Secondary | ICD-10-CM

## 2019-01-09 MED ORDER — HYDROCODONE-ACETAMINOPHEN 5-325 MG PO TABS: 1.0000 | ORAL_TABLET | Freq: Four times a day (QID) | ORAL | 0 refills | Status: DC | PRN

## 2019-01-10 ENCOUNTER — Telehealth (INDEPENDENT_AMBULATORY_CARE_PROVIDER_SITE_OTHER): Payer: BC Managed Care – PPO | Admitting: Family Medicine

## 2019-01-10 ENCOUNTER — Telehealth: Payer: Self-pay | Admitting: Family Medicine

## 2019-01-10 ENCOUNTER — Encounter: Payer: Self-pay | Admitting: Family Medicine

## 2019-01-10 VITALS — BP 158/106 | HR 88

## 2019-01-10 DIAGNOSIS — I10 Essential (primary) hypertension: Secondary | ICD-10-CM | POA: Diagnosis not present

## 2019-01-10 DIAGNOSIS — I471 Supraventricular tachycardia: Secondary | ICD-10-CM

## 2019-01-10 MED ORDER — LOSARTAN POTASSIUM-HCTZ 100-25 MG PO TABS: 1.0000 | ORAL_TABLET | Freq: Every day | ORAL | 1 refills | Status: DC

## 2019-01-10 NOTE — Patient Instructions (Signed)
.   Please review the attached list of medications and notify my office if there are any errors.   . Please bring all of your medications to every appointment so we can make sure that our medication list is the same as yours.   . It is especially important to get the annual flu vaccine this year. If you haven't had it already, please go to your pharmacy or call the office as soon as possible to schedule you flu shot.  

## 2019-01-10 NOTE — Telephone Encounter (Signed)
Can patient please be scheduled for 3 months follow up for hypertension. Thanks.

## 2019-01-10 NOTE — Telephone Encounter (Signed)
Appointment scheduled for 04/18/2018 at 4pm.

## 2019-01-10 NOTE — Progress Notes (Signed)
Patient: Devin Parsons Male    DOB: 03/19/1970   48 y.o.   MRN: [id number] Visit Date: 01/10/2019  Today's Provider: Lelon Huh, MD   Chief Complaint  Patient presents with  . Hypertension   Subjective:    Virtual Visit via Video Note  I connected with Candelaria Stagers on 01/10/19 at  4:00 PM EST by a video enabled telemedicine application and verified that I am speaking with the correct person using two identifiers.  Location: Patient: home Provider: [facility]   I discussed the limitations of evaluation and management by telemedicine and the availability of in person appointments. The patient expressed understanding and agreed to proceed.    I discussed the assessment and treatment plan with the patient. The patient was provided an opportunity to ask questions and all were answered. The patient agreed with the plan and demonstrated an understanding of the instructions.   The patient was advised to call back or seek an in-person evaluation if the symptoms worsen or if the condition fails to improve as anticipated.  I provided 6 minutes of non-face-to-face time during this encounter.    HPI  Hypertension, follow-up:  BP Readings from Last 3 Encounters:  01/10/19 (!) 158/106  10/09/18 (!) 122/94  07/08/18 (!) 152/102    He was last seen for hypertension 3 months ago.  BP at that visit was 122/94. Management since that visit includes no changes. He reports good compliance with treatment. He is not having side effects.  He is not exercising. He is not adherent to low salt diet.   Outside blood pressures are 150/90's. He is experiencing chest pain, chest pressure/discomfort, fatigue, irregular heart beat, near-syncope and palpitations.      Weight trend: stable Wt Readings from Last 3 Encounters:  10/09/18 241 lb 12.8 oz (109.7 kg)  07/08/18 242 lb (109.8 kg)  02/21/18 242 lb (109.8 kg)    Current diet: not  asked  ------------------------------------------------------------------------  No Known Allergies   Current Outpatient Medications:  .  buPROPion (WELLBUTRIN SR) 150 MG 12 hr tablet, TAKE 1 TABLET BY MOUTH TWICE DAILY, Disp: 60 tablet, Rfl: 11 .  HYDROcodone-acetaminophen (NORCO/VICODIN) 5-325 MG tablet, Take 1 tablet by mouth every 6 (six) hours as needed for moderate pain., Disp: 60 tablet, Rfl: 0 .  hyoscyamine (LEVSIN SL) 0.125 MG SL tablet, Place 1 tablet (0.125 mg total) under the tongue every 4 (four) hours as needed., Disp: 30 tablet, Rfl: 0 .  ibuprofen (ADVIL,MOTRIN) 200 MG tablet, Take 400-600 mg by mouth 2 (two) times daily as needed for headache or mild pain. , Disp: , Rfl:  .  losartan-hydrochlorothiazide (HYZAAR) 100-12.5 MG tablet, Take 1 tablet by mouth daily., Disp: 90 tablet, Rfl: 1 .  meloxicam (MOBIC) 15 MG tablet, TAKE 1 TABLET BY MOUTH DAILY AS NEEDED FOR PAIN, REPLACES NABUMETONE, Disp: 30 tablet, Rfl: 5 .  PARoxetine (PAXIL) 20 MG tablet, Take 1 tablet (20 mg total) by mouth daily., Disp: 30 tablet, Rfl: 12 .  verapamil (CALAN-SR) 180 MG CR tablet, Take 1 tablet (180 mg total) by mouth 2 (two) times daily., Disp: 60 tablet, Rfl: 11  Review of Systems  Constitutional: Negative for appetite change, chills and fever.  Respiratory: Negative for chest tightness, shortness of breath and wheezing.   Cardiovascular: Negative for chest pain and palpitations.  Gastrointestinal: Negative for abdominal pain, nausea and vomiting.    Social History   Tobacco Use  . Smoking status: Current Every Day Smoker  Packs/day: 1.00    Years: 31.00    Pack years: 31.00  . Smokeless tobacco: Never Used  Substance Use Topics  . Alcohol use: No    Alcohol/week: 0.0 standard drinks    Comment: formerly a heavy drinker in high school      Objective:   BP (!) 158/106   Pulse 88  Vitals:   01/10/19 1348  BP: (!) 158/106  Pulse: 88  There is no height or weight on file to  calculate BMI.   Physical Exam  Awake, alert, oriented x 3, no distress     Assessment & Plan    1. Essential hypertension Not at goal, increase to - losartan-hydrochlorothiazide (HYZAAR) 100-25 MG tablet; Take 1 tablet by mouth daily.  Dispense: 90 tablet; Refill: 1  2. SVT (supraventricular tachycardia) ([place]) Completely asymptomatic on current dose of verapamil.   The entirety of the information documented in the History of Present Illness, Review of Systems and Physical Exam were personally obtained by me. Portions of this information were initially documented by Meyer Cory, CMA and reviewed by me for thoroughness and accuracy.      Lelon Huh, MD  [facility]

## 2019-01-23 ENCOUNTER — Other Ambulatory Visit: Payer: Self-pay | Admitting: Family Medicine

## 2019-01-23 DIAGNOSIS — M5020 Other cervical disc displacement, unspecified cervical region: Secondary | ICD-10-CM

## 2019-01-23 MED ORDER — HYDROCODONE-ACETAMINOPHEN 5-325 MG PO TABS: 1.0000 | ORAL_TABLET | Freq: Four times a day (QID) | ORAL | 0 refills | Status: DC | PRN

## 2019-01-23 NOTE — Telephone Encounter (Signed)
L.O.V. was on 01/10/2019 and upcoming appointment on 04/18/2019.

## 2019-02-06 ENCOUNTER — Other Ambulatory Visit: Payer: Self-pay | Admitting: Family Medicine

## 2019-02-06 DIAGNOSIS — M5020 Other cervical disc displacement, unspecified cervical region: Secondary | ICD-10-CM

## 2019-02-07 ENCOUNTER — Encounter: Payer: Self-pay | Admitting: Family Medicine

## 2019-02-07 DIAGNOSIS — J40 Bronchitis, not specified as acute or chronic: Secondary | ICD-10-CM

## 2019-02-07 MED ORDER — BUDESONIDE-FORMOTEROL FUMARATE 160-4.5 MCG/ACT IN AERO: 1.0000 | INHALATION_SPRAY | Freq: Two times a day (BID) | RESPIRATORY_TRACT | 3 refills | Status: DC

## 2019-02-07 MED ORDER — HYDROCODONE-ACETAMINOPHEN 5-325 MG PO TABS: 1.0000 | ORAL_TABLET | Freq: Four times a day (QID) | ORAL | 0 refills | Status: DC | PRN

## 2019-02-10 ENCOUNTER — Encounter: Payer: Self-pay | Admitting: Family Medicine

## 2019-02-10 MED ORDER — ALBUTEROL SULFATE HFA 108 (90 BASE) MCG/ACT IN AERS: 1.0000 | INHALATION_SPRAY | Freq: Four times a day (QID) | RESPIRATORY_TRACT | 3 refills | Status: DC | PRN

## 2019-02-11 ENCOUNTER — Telehealth: Payer: Self-pay | Admitting: Family Medicine

## 2019-02-11 DIAGNOSIS — I7121 Aneurysm of the ascending aorta, without rupture: Secondary | ICD-10-CM

## 2019-02-11 DIAGNOSIS — I712 Thoracic aortic aneurysm, without rupture: Secondary | ICD-10-CM

## 2019-02-11 NOTE — Telephone Encounter (Signed)
Please advise that it is time to get CTA of aorta to follow up on aortic aneurysm. Have sent order to sarah to schedule.

## 2019-02-11 NOTE — Telephone Encounter (Signed)
The patient has been notified and will wait for them to call and schedule the appointment.

## 2019-02-19 DIAGNOSIS — Z20828 Contact with and (suspected) exposure to other viral communicable diseases: Secondary | ICD-10-CM | POA: Diagnosis not present

## 2019-02-20 ENCOUNTER — Other Ambulatory Visit: Payer: Self-pay | Admitting: Family Medicine

## 2019-02-20 DIAGNOSIS — M5020 Other cervical disc displacement, unspecified cervical region: Secondary | ICD-10-CM

## 2019-02-20 MED ORDER — HYDROCODONE-ACETAMINOPHEN 5-325 MG PO TABS: 1.0000 | ORAL_TABLET | Freq: Four times a day (QID) | ORAL | 0 refills | Status: DC | PRN

## 2019-03-03 DIAGNOSIS — Z85828 Personal history of other malignant neoplasm of skin: Secondary | ICD-10-CM | POA: Diagnosis not present

## 2019-03-03 DIAGNOSIS — D2262 Melanocytic nevi of left upper limb, including shoulder: Secondary | ICD-10-CM | POA: Diagnosis not present

## 2019-03-03 DIAGNOSIS — D225 Melanocytic nevi of trunk: Secondary | ICD-10-CM | POA: Diagnosis not present

## 2019-03-03 DIAGNOSIS — D2261 Melanocytic nevi of right upper limb, including shoulder: Secondary | ICD-10-CM | POA: Diagnosis not present

## 2019-03-06 ENCOUNTER — Other Ambulatory Visit: Payer: Self-pay | Admitting: Family Medicine

## 2019-03-06 DIAGNOSIS — M5020 Other cervical disc displacement, unspecified cervical region: Secondary | ICD-10-CM

## 2019-03-06 MED ORDER — HYDROCODONE-ACETAMINOPHEN 5-325 MG PO TABS: 1.0000 | ORAL_TABLET | Freq: Four times a day (QID) | ORAL | 0 refills | Status: DC | PRN

## 2019-03-24 ENCOUNTER — Other Ambulatory Visit: Payer: Self-pay | Admitting: Family Medicine

## 2019-03-24 DIAGNOSIS — I10 Essential (primary) hypertension: Secondary | ICD-10-CM

## 2019-03-24 DIAGNOSIS — M5020 Other cervical disc displacement, unspecified cervical region: Secondary | ICD-10-CM

## 2019-03-24 DIAGNOSIS — I471 Supraventricular tachycardia: Secondary | ICD-10-CM

## 2019-03-24 MED ORDER — HYDROCODONE-ACETAMINOPHEN 5-325 MG PO TABS: 1.0000 | ORAL_TABLET | Freq: Four times a day (QID) | ORAL | 0 refills | Status: DC | PRN

## 2019-03-24 NOTE — Telephone Encounter (Signed)
Requested Prescriptions  Pending Prescriptions Disp Refills  . verapamil (CALAN-SR) 180 MG CR tablet [Pharmacy Med Name: VERAPAMIL ER 180MG  TABLETS] 60 tablet 11    Sig: TAKE 1 TABLET(180 MG) BY MOUTH TWICE DAILY     Cardiovascular:  Calcium Channel Blockers Failed - 03/24/2019  8:00 AM      Failed - Last BP in normal range    BP Readings from Last 1 Encounters:  01/10/19 (!) 158/106         Passed - Valid encounter within last 6 months    Recent Outpatient Visits          2 months ago Essential hypertension   [facility] Birdie Sons, MD   5 months ago Essential hypertension   [facility] Birdie Sons, MD   8 months ago Essential hypertension   [facility] Birdie Sons, MD   1 year ago Aneurysm of ascending aorta [facility])   [facility] Birdie Sons, MD   1 year ago SVT (supraventricular tachycardia) [facility])   [facility] Birdie Sons, MD      Future Appointments            In 3 weeks Fisher, Kirstie Peri, MD [facility], [place]

## 2019-04-08 ENCOUNTER — Other Ambulatory Visit: Payer: Self-pay | Admitting: Family Medicine

## 2019-04-08 DIAGNOSIS — M5020 Other cervical disc displacement, unspecified cervical region: Secondary | ICD-10-CM

## 2019-04-08 MED ORDER — HYDROCODONE-ACETAMINOPHEN 5-325 MG PO TABS: 1.0000 | ORAL_TABLET | Freq: Four times a day (QID) | ORAL | 0 refills | Status: DC | PRN

## 2019-04-16 ENCOUNTER — Telehealth: Payer: Self-pay

## 2019-04-16 ENCOUNTER — Emergency Department: Payer: BC Managed Care – PPO

## 2019-04-16 ENCOUNTER — Telehealth: Payer: BC Managed Care – PPO | Admitting: Adult Health

## 2019-04-16 ENCOUNTER — Other Ambulatory Visit: Payer: Self-pay

## 2019-04-16 ENCOUNTER — Emergency Department
Admission: EM | Admit: 2019-04-16 | Discharge: 2019-04-16 | Disposition: A | Discharge status: Home / Self Care | Payer: BC Managed Care – PPO | Attending: Emergency Medicine | Admitting: Emergency Medicine

## 2019-04-16 ENCOUNTER — Other Ambulatory Visit: Payer: Self-pay | Admitting: Family Medicine

## 2019-04-16 ENCOUNTER — Encounter: Payer: Self-pay | Admitting: Emergency Medicine

## 2019-04-16 DIAGNOSIS — R112 Nausea with vomiting, unspecified: Secondary | ICD-10-CM | POA: Diagnosis not present

## 2019-04-16 DIAGNOSIS — R1011 Right upper quadrant pain: Secondary | ICD-10-CM | POA: Insufficient documentation

## 2019-04-16 DIAGNOSIS — K7689 Other specified diseases of liver: Secondary | ICD-10-CM | POA: Diagnosis not present

## 2019-04-16 DIAGNOSIS — Z79899 Other long term (current) drug therapy: Secondary | ICD-10-CM | POA: Insufficient documentation

## 2019-04-16 DIAGNOSIS — Z85828 Personal history of other malignant neoplasm of skin: Secondary | ICD-10-CM | POA: Insufficient documentation

## 2019-04-16 DIAGNOSIS — R1010 Upper abdominal pain, unspecified: Secondary | ICD-10-CM | POA: Diagnosis not present

## 2019-04-16 DIAGNOSIS — F17210 Nicotine dependence, cigarettes, uncomplicated: Secondary | ICD-10-CM | POA: Insufficient documentation

## 2019-04-16 DIAGNOSIS — R000 Tachycardia, unspecified: Secondary | ICD-10-CM | POA: Diagnosis not present

## 2019-04-16 DIAGNOSIS — I10 Essential (primary) hypertension: Secondary | ICD-10-CM | POA: Insufficient documentation

## 2019-04-16 DIAGNOSIS — R1110 Vomiting, unspecified: Secondary | ICD-10-CM | POA: Diagnosis not present

## 2019-04-16 LAB — CBC
HCT: 46.9 % (ref 39.0–52.0)
Hemoglobin: 15.9 g/dL (ref 13.0–17.0)
MCH: 30.1 pg (ref 26.0–34.0)
MCHC: 33.9 g/dL (ref 30.0–36.0)
MCV: 88.8 fL (ref 80.0–100.0)
Platelets: 442 10*3/uL — ABNORMAL HIGH (ref 150–400)
RBC: 5.28 MIL/uL (ref 4.22–5.81)
RDW: 13.4 % (ref 11.5–15.5)
WBC: 15.6 10*3/uL — ABNORMAL HIGH (ref 4.0–10.5)
nRBC: 0 % (ref 0.0–0.2)

## 2019-04-16 LAB — COMPREHENSIVE METABOLIC PANEL
ALT: 24 U/L (ref 0–44)
AST: 14 U/L — ABNORMAL LOW (ref 15–41)
Albumin: 4.4 g/dL (ref 3.5–5.0)
Alkaline Phosphatase: 63 U/L (ref 38–126)
Anion gap: 11 (ref 5–15)
BUN: 20 mg/dL (ref 6–20)
CO2: 24 mmol/L (ref 22–32)
Calcium: 9.9 mg/dL (ref 8.9–10.3)
Chloride: 103 mmol/L (ref 98–111)
Creatinine, Ser: 1 mg/dL (ref 0.61–1.24)
GFR calc Af Amer: >60 mL/min (ref >60)
GFR calc non Af Amer: >60 mL/min (ref >60)
Glucose, Bld: 107 mg/dL — ABNORMAL HIGH (ref 70–99)
Potassium: 3.9 mmol/L (ref 3.5–5.1)
Sodium: 138 mmol/L (ref 135–145)
Total Bilirubin: 1 mg/dL (ref 0.3–1.2)
Total Protein: 8.1 g/dL (ref 6.5–8.1)

## 2019-04-16 LAB — LIPASE, BLOOD: Lipase: 30 U/L (ref 11–51)

## 2019-04-16 MED ORDER — FAMOTIDINE 20 MG PO TABS: 20.0000 mg | ORAL_TABLET | Freq: Two times a day (BID) | ORAL | 0 refills | Status: AC

## 2019-04-16 MED ORDER — ONDANSETRON 4 MG PO TBDP: 4.0000 mg | ORAL_TABLET | Freq: Three times a day (TID) | ORAL | 0 refills | Status: DC | PRN

## 2019-04-16 MED ORDER — ONDANSETRON HCL 4 MG/2ML IJ SOLN
4.0000 mg | Freq: Once | INTRAMUSCULAR | Status: AC
  Administered 2019-04-16: 18:00:00 4 mg via INTRAVENOUS
  Filled 2019-04-16: qty 2

## 2019-04-16 MED ORDER — KETOROLAC TROMETHAMINE 30 MG/ML IJ SOLN
30.0000 mg | Freq: Once | INTRAMUSCULAR | Status: AC
  Administered 2019-04-16: 30 mg via INTRAMUSCULAR
  Filled 2019-04-16: qty 1

## 2019-04-16 MED ORDER — IOHEXOL 300 MG/ML  SOLN
100.0000 mL | Freq: Once | INTRAMUSCULAR | Status: AC | PRN
  Administered 2019-04-16: 18:00:00 100 mL via INTRAVENOUS
  Filled 2019-04-16: qty 100

## 2019-04-16 MED ORDER — MORPHINE SULFATE (PF) 4 MG/ML IV SOLN
4.0000 mg | Freq: Once | INTRAVENOUS | Status: AC
  Administered 2019-04-16: 4 mg via INTRAVENOUS
  Filled 2019-04-16: qty 1

## 2019-04-16 NOTE — ED Notes (Signed)
Pt given crackers and peanut butter.

## 2019-04-16 NOTE — ED Provider Notes (Signed)
[facility] Emergency Department Provider Note   ____________________________________________    I have reviewed the triage vital signs and the nursing notes.   HISTORY  Chief Complaint Abdominal Pain, Nausea, and Emesis     HPI Devin Parsons is a 49 y.o. male with a history as noted below who presents with complaints of upper abdominal pain.  Patient reports he has been having moderate to severe upper abdominal pain x3 days.  He does have nausea and vomiting.  He reports he has had this over the last couple of years intermittently.  He wonders if it may be his gallbladder.  Denies fevers or chills.  Has not taken anything for this.  No diarrhea.  No history of abdominal surgery  Past Medical History:  Diagnosis Date  . History of chicken pox   . SVT (supraventricular tachycardia) ([place])    a. echo 07/2014: EF 50-55%, no RWMA, GR1DD, PASP nl  . Syncope     Patient Active Problem List   Diagnosis Date Noted  . COVID-19 12/24/2018  . Aneurysm of ascending aorta ([place]) 02/21/2018  . History of basal cell carcinoma (BCC) 10/09/2017  . Hypertension 09/21/2015  . 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 Back pain, chronic 11/19/2014  . Hyperlipidemia, mixed 11/19/2014  . Vestibular dizziness 11/19/2014  . Tobacco abuse   . SVT (supraventricular tachycardia) ([place])   . Syncope, near 08/13/2014  . Episodic mood disorder ([place]) 11/30/2007  . Displacement of cervical intervertebral disc without myelopathy 04/22/2007    Past Surgical History:  Procedure Laterality Date  . FRACTURE SURGERY    . KNEE SURGERY Left    Arthroscopy surgery performed  . MOHS SURGERY Right 10/08/2017   [place] Dr. Mickie Hillier  . MRI OF Lumbar spine  06/14/2004   Results: Herniated disc with facet reaction L5/S1. Done at [facility]  . TONSILLECTOMY AND ADENOIDECTOMY      Prior to Admission medications   Medication Sig Start Date End Date Taking? Authorizing  Provider  albuterol (VENTOLIN HFA) 108 (90 Base) MCG/ACT inhaler Inhale 1-2 puffs into the lungs every 6 (six) hours as needed for shortness of breath. 02/10/19   Birdie Sons, MD  buPROPion [facility] SR) 150 MG 12 hr tablet TAKE 1 TABLET BY MOUTH TWICE DAILY 04/30/18   Birdie Sons, MD  HYDROcodone-acetaminophen (NORCO/VICODIN) 5-325 MG tablet Take 1 tablet by mouth every 6 (six) hours as needed for moderate pain. 04/08/19   Birdie Sons, MD  hyoscyamine (LEVSIN SL) 0.125 MG SL tablet DISSOLVE 1 TABLET(0.125 MG) UNDER THE TONGUE EVERY 4 HOURS AS NEEDED 04/16/19   Flinchum, Kelby Aline, FNP  ibuprofen (ADVIL,MOTRIN) 200 MG tablet Take 400-600 mg by mouth 2 (two) times daily as needed for headache or mild pain.     [provider]  losartan-hydrochlorothiazide (HYZAAR) 100-25 MG tablet Take 1 tablet by mouth daily. 01/10/19   Birdie Sons, MD  meloxicam (MOBIC) 15 MG tablet TAKE 1 TABLET BY MOUTH DAILY AS NEEDED FOR PAIN, REPLACES NABUMETONE 05/13/18   Birdie Sons, MD  PARoxetine (PAXIL) 20 MG tablet Take 1 tablet (20 mg total) by mouth daily. 11/19/18   Birdie Sons, MD  verapamil (CALAN-SR) 180 MG CR tablet TAKE 1 TABLET(180 MG) BY MOUTH TWICE DAILY 03/24/19   Birdie Sons, MD     Allergies Patient has no known allergies.  Family History  Problem Relation Age of Onset  . Diabetes Mother  Social History Social History   Tobacco Use  . Smoking status: Current Every Day Smoker    Packs/day: 1.00    Years: 31.00    Pack years: 31.00  . Smokeless tobacco: Never Used  Substance Use Topics  . Alcohol use: No    Alcohol/week: 0.0 standard drinks    Comment: formerly a heavy drinker in high school  . Drug use: No    Review of Systems  Constitutional: No fever/chills Eyes: No visual changes.  ENT: No sore throat. Cardiovascular: Denies chest pain. Respiratory: Denies shortness of breath. Gastrointestinal: As above Genitourinary: Negative for  dysuria. Musculoskeletal: Negative for back pain. Skin: Negative for rash. Neurological: Negative for headaches   ____________________________________________   PHYSICAL EXAM:  VITAL SIGNS: ED Triage Vitals  Enc Vitals Group     BP 04/16/19 1507 (!) 137/113     Pulse Rate 04/16/19 1507 (!) 112     Resp 04/16/19 1507 20     Temp 04/16/19 1507 98.5 F (36.9 C)     Temp Source 04/16/19 1507 Oral     SpO2 04/16/19 1507 95 %     Weight 04/16/19 1505 108.9 kg (240 lb)     Height 04/16/19 1505 1.803 m (5\' 11" )     Head Circumference --      Peak Flow --      Pain Score 04/16/19 1505 5     Pain Loc --      Pain Edu? --      Excl. in [place]? --     Constitutional: Alert and oriented.   Nose: No congestion/rhinnorhea. Mouth/Throat: Mucous membranes are moist.    Cardiovascular: Normal rate, regular rhythm.   Good peripheral circulation. Respiratory: Normal respiratory effort.  No retractions. Lungs CTAB. Gastrointestinal: Tenderness palpation the right upper quadrant, no distention, no CVA tenderness Musculoskeletal: No lower extremity tenderness nor edema.  Warm and well perfused Neurologic:  Normal speech and language. No gross focal neurologic deficits are appreciated.  Skin:  Skin is warm, dry and intact. No rash noted. Psychiatric: Mood and affect are normal. Speech and behavior are normal.  ____________________________________________   LABS (all labs ordered are listed, but only abnormal results are displayed)  Labs Reviewed  COMPREHENSIVE METABOLIC PANEL - Abnormal; Notable for the following components:      Result Value   Glucose, Bld 107 (*)    AST 14 (*)    All other components within normal limits  CBC - Abnormal; Notable for the following components:   WBC 15.6 (*)    Platelets 442 (*)    All other components within normal limits  LIPASE, BLOOD  URINALYSIS, COMPLETE (UACMP) WITH MICROSCOPIC   ____________________________________________  EKG  ED ECG  REPORT I, Lavonia Drafts, the attending physician, personally viewed and interpreted this ECG.   Rhythm: normal sinus rhythm QRS Axis: normal Intervals: normal ST/T Wave abnormalities: normal Narrative Interpretation: no evidence of acute ischemia  ____________________________________________  RADIOLOGY  Ultrasound upper abdomen CT abdomen pelvis ____________________________________________   PROCEDURES  Procedure(s) performed: No  Procedures   Critical Care performed: No ____________________________________________   INITIAL IMPRESSION / ASSESSMENT AND PLAN / ED COURSE  Pertinent labs & imaging results that were available during my care of the patient were reviewed by me and considered in my medical decision making (see chart for details).  Patient presents with upper abdominal pain.  Differential includes gastritis, cholelithiasis, pancreatitis.  Pain seems to be primarily over the right upper quadrant suspicious for cholecystitis versus  cholelithiasis.  Will treat with IM Toradol.  LFTs are normal.  Lab work significant for mildly elevated white blood cell count.  Ultrasound pending  Ultrasound is remarkable only for a hepatic cyst, will send for CT abdomen pelvis.  We will give IV morphine, IV Zofran given little response to Toradol  Have asked my colleague to follow-up on CT results    ____________________________________________   FINAL CLINICAL IMPRESSION(S) / ED DIAGNOSES  Final diagnoses:  Upper abdominal pain        Note:  This document was prepared using Dragon voice recognition software and may include unintentional dictation errors.   Lavonia Drafts, MD 04/16/19 [phone]

## 2019-04-16 NOTE — Telephone Encounter (Signed)
Returned call to patients wife 04/16/2019 155pm  on DPR, she reports he felt very bad on Sunday/21/2021 and would not eat,  he was vomiting on 04/13/2019 and Tuesday, he then slept all day. She reports today 04/16/2019 he is has thrown up bile all day and now is dry heaving. He has not been able to keep anything down today. He has had abdominal pain middle radiating to right side of abdomen. He is dizzy, weak and lightheaded. He is very weak she reports.  He has not ate or drank much of anything since Sunday and is just wanting to sleep. Last BM was " very tiny amount and that was Monday 04/14/19.Denies any ill contacts.  History of documented aortic aneurysm - no follow up scan.  Has regular appointment  with Dr. Caryn Section for follow up in two days.   Advised virtual appointment not advised and patient needs in person evaluation now , she will take patient to the emergency room for further evaluation given severity of symptoms and abdominal pain. Patient's wide Maudie Mercury,  verbalized understanding of all instructions given and denies any further questions at this time.      This note is not being shared with the patient for the following reason: To respect privacy (The patient or proxy has requested that the information not be shared).thisnote is marked as share it would not let provider close note.

## 2019-04-16 NOTE — ED Provider Notes (Signed)
Procedures     ----------------------------------------- 8:05 PM on 04/16/2019 -----------------------------------------  CT scan unremarkable.  Patient tolerating oral intake without difficulty.  Feeling better, sitting upright in the chair.  Nontoxic and well-appearing.  Stable for outpatient follow-up.    Carrie Mew, MD 04/16/19 2006

## 2019-04-16 NOTE — Telephone Encounter (Addendum)
This man has a appointment with Sharyn Lull today at 4 pm   Copied from [place] [phone]. Topic: General - Inquiry >> Apr 16, 2019 12:17 PM Alease Frame wrote: Reason for CRM:pt is wanting for a call back from someone in office . Please advise

## 2019-04-16 NOTE — ED Notes (Signed)
Reports abd pain with nausea x few days, comes and goes not able to eat in 3 days. IV hep lock place to right hand, meds given, ct completed awaiting results and disposition.

## 2019-04-16 NOTE — Telephone Encounter (Signed)
Requested medication (s) are due for refill today: Yes  Requested medication (s) are on the active medication list: Yes  Last refill:  09/05/18  Future visit scheduled: Yes  Notes to clinic:  See request.    Requested Prescriptions  Pending Prescriptions Disp Refills   hyoscyamine (LEVSIN SL) 0.125 MG SL tablet [Pharmacy Med Name: HYOSCYAMINE 0.125MG  SUBLINGUAL TABS] 30 tablet 0    Sig: DISSOLVE 1 TABLET(0.125 MG) UNDER THE TONGUE EVERY 4 HOURS AS NEEDED      Off-Protocol Failed - 04/16/2019  9:31 AM      Failed - Medication not assigned to a protocol, review manually.      Passed - Valid encounter within last 12 months    Recent Outpatient Visits           3 months ago Essential hypertension   [facility] Birdie Sons, MD   6 months ago Essential hypertension   [facility] Birdie Sons, MD   9 months ago Essential hypertension   [facility] Birdie Sons, MD   1 year ago Aneurysm of ascending aorta [facility])   [facility] Birdie Sons, MD   1 year ago SVT (supraventricular tachycardia) [facility])   [facility] Birdie Sons, MD       Future Appointments             Today Flinchum, Kelby Aline, [place], [place]   In 2 days Caryn Section, Kirstie Peri, MD [facility], [place]

## 2019-04-16 NOTE — ED Triage Notes (Signed)
Pt reports woke up Monday morning with NV and abd pain. Pt states that he thought he ate something bad but he is still having sx's. Pt reports he called his MD and was told to come to the ED and be evaluated. Pt last vomited at 10am this am

## 2019-04-16 NOTE — Discharge Instructions (Signed)
Your CT scan shows a small area of inflammation at the end of your small intestine. Follow a bland diet and take medications as prescribed to manage symptoms and allow for good hydration.  Follow up with Gastroenterology in a week for further evaluation of your symptoms.

## 2019-04-18 ENCOUNTER — Encounter: Payer: Self-pay | Admitting: Family Medicine

## 2019-04-18 ENCOUNTER — Ambulatory Visit (INDEPENDENT_AMBULATORY_CARE_PROVIDER_SITE_OTHER): Payer: BC Managed Care – PPO | Admitting: Family Medicine

## 2019-04-18 ENCOUNTER — Other Ambulatory Visit: Payer: Self-pay

## 2019-04-18 VITALS — BP 122/100 | HR 93 | Temp 97.5°F | Resp 18 | Wt 240.0 lb

## 2019-04-18 DIAGNOSIS — I471 Supraventricular tachycardia: Secondary | ICD-10-CM

## 2019-04-18 DIAGNOSIS — I712 Thoracic aortic aneurysm, without rupture: Secondary | ICD-10-CM | POA: Diagnosis not present

## 2019-04-18 DIAGNOSIS — K529 Noninfective gastroenteritis and colitis, unspecified: Secondary | ICD-10-CM | POA: Diagnosis not present

## 2019-04-18 DIAGNOSIS — I10 Essential (primary) hypertension: Secondary | ICD-10-CM | POA: Diagnosis not present

## 2019-04-18 DIAGNOSIS — I7121 Aneurysm of the ascending aorta, without rupture: Secondary | ICD-10-CM

## 2019-04-18 NOTE — Telephone Encounter (Signed)
Please review message and advise if patient's scheduled 4pm appointment today needs to be virtual. His appointment was originally scheduled as a 3 month follow up, but he was recently seen in the ER for upper abd pain, nausea and vomiting.

## 2019-04-18 NOTE — Telephone Encounter (Addendum)
Patient's wife called in wanting to verify with Sharyn Lull that patient is coming IN OFFICE today for appointment. Advised patient's wife that it will be in office, unless patient would like it changed or if symptoms are still happening. Patient's wife became upset stating she wants to speak with Sharyn Lull, that she needs to call her ASAP, she wants to verify from Sharyn Lull that patient is coming in to office, as he did go to ED. Please advise as patient's wife states she does not trust speaking with [facility] and wishes to just speak with office.

## 2019-04-18 NOTE — Progress Notes (Signed)
Established patient visit      Patient: Devin Parsons   DOB: 12/26/1970   49 y.o. Male  MRN: [id number] Visit Date: 04/18/2019  Today's healthcare provider: Lelon Huh, MD  Subjective:    Chief Complaint  Patient presents with  . Hypertension  . Follow-up   HPI  Hypertension, follow-up:     BP Readings from Last 3 Encounters:  01/10/19 (!) 158/106  10/09/18 (!) 122/94  07/08/18 (!) 152/102    He was last seen for hypertension 3 months ago.  BP at that visit was 158/106. Management since that visit includes increase Losartan-HCTZ to 100-25mg   He reports good compliance with treatment. He is not having side effects.  He is not exercising. He is not adherent to low salt diet.   Outside blood pressures are checked occasionally                Weight trend: stable    Wt Readings from Last 3 Encounters:  10/09/18 241 lb 12.8 oz (109.7 kg)  07/08/18 242 lb (109.8 kg)  02/21/18 242 lb (109.8 kg)    Current diet:   ------------------------------------------------------------------------   Follow Up ER Visit  Patient is here for ER follow up.  He was recently seen at [facility] for upper abdominal pain on 04/16/2019. 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 CT abdomen pelvis was ordered. Patient was given Toradol, IV morphine, IV Zofran. He reports good compliance with treatment. He reports this condition is Improved.  ------------------------------------------------------------------------------------       Medications: Outpatient Medications Prior to Visit  Medication Sig  . albuterol Inhale 1-2 puffs into the lungs every 6 (six) hours as needed for shortness of breath.  Marland Kitchen buPROPion TAKE 1 TABLET BY MOUTH TWICE DAILY  . famotidine Take 1 tablet (20 mg total) by mouth 2 (two) times daily.  Marland Kitchen HYDROcodone-acetaminophen Take 1 tablet by mouth every 6 (six) hours as needed for moderate pain.  .  hyoscyamine DISSOLVE 1 TABLET(0.125 MG) UNDER THE TONGUE EVERY 4 HOURS AS NEEDED  . ibuprofen Take 400-600 mg by mouth 2 (two) times daily as needed for headache or mild pain.   Marland Kitchen losartan-hydrochlorothiazide Take 1 tablet by mouth daily.  . meloxicam TAKE 1 TABLET BY MOUTH DAILY AS NEEDED FOR PAIN, REPLACES NABUMETONE  . ondansetron Take 1 tablet (4 mg total) by mouth every 8 (eight) hours as needed for nausea or vomiting.  Marland Kitchen PARoxetine Take 1 tablet (20 mg total) by mouth daily.  . verapamil TAKE 1 TABLET(180 MG) BY MOUTH TWICE DAILY   No facility-administered medications prior to visit.    Review of Systems  Constitutional: Negative for appetite change, chills and fever.  Respiratory: Negative for chest tightness, shortness of breath and wheezing.   Cardiovascular: Negative for chest pain and palpitations.  Gastrointestinal: Positive for abdominal pain and nausea. Negative for vomiting.    Last CBC Lab Results  Component Value Date   WBC 15.6 (H) 04/16/2019   HGB 15.9 04/16/2019   HCT 46.9 04/16/2019   MCV 88.8 04/16/2019   MCH 30.1 04/16/2019   RDW 13.4 04/16/2019   PLT 442 (H) [id number]   Last metabolic panel Lab Results  Component Value Date   GLUCOSE 107 (H) 04/16/2019   NA 138 04/16/2019   K 3.9 04/16/2019   CL 103 04/16/2019   CO2 24 04/16/2019   BUN 20 04/16/2019   CREATININE 1.00 04/16/2019   GFRNONAA >60 04/16/2019  GFRAA >60 04/16/2019   CALCIUM 9.9 04/16/2019   PROT 8.1 04/16/2019   ALBUMIN 4.4 04/16/2019   LABGLOB 3.0 10/09/2018   AGRATIO 1.5 10/09/2018   BILITOT 1.0 04/16/2019   ALKPHOS 63 04/16/2019   AST 14 (L) 04/16/2019   ALT 24 04/16/2019   ANIONGAP 11 04/16/2019        Objective:    BP (!) 122/100 (BP Location: Right Arm, Cuff Size: Large)   Pulse 93   Temp (!) 97.5 F (36.4 C) (Temporal)   Resp 18   Wt 240 lb (108.9 kg)   SpO2 97% Comment: room air  BMI 33.47 kg/m  BP Readings from Last 3 Encounters:  04/18/19 (!) 122/100    04/16/19 (!) 137/113  01/10/19 (!) 158/106      Physical Exam   General Appearance:    Overweight male, alert, cooperative, in no acute distress  Eyes:    PERRL, conjunctiva/corneas clear, EOM's intact       Lungs:     Clear to auscultation bilaterally, respirations unlabored  Heart:    Normal heart rate. Normal rhythm. No murmurs, rubs, or gallops.   Abdomen:   bowel sounds present and hypoactive in all 4 quadrants, soft, round or mild epigastric tenderness.     No results found for any visits on 04/18/19.    Assessment & Plan:    1. Essential hypertension Doing well with increased dose of losartan-hctz. Normal electrolytes at recent ER visit.   2. Aneurysm of ascending aorta ([place]) Counseled is due for annual CTA follow up. Message sent to referral coodinator.   3. SVT (supraventricular tachycardia) (HCC) Well controlled Continue current medications.    Abdominal pain and vomiting improving, is still consuming mostly liquids and counseled to advance diet slowly as tolerated.       Lelon Huh, MD  [facility] [phone] (phone) [phone] (fax)  [place]

## 2019-04-18 NOTE — Patient Instructions (Signed)
. Please review the attached list of medications and notify my office if there are any errors.   . You should get a call from our referral coordinator next week to set up a follow up CT of the ascendning aorta   Thoracic Aortic Aneurysm  An aneurysm is a bulge in an artery. It happens when blood pushes up against a weakened or damaged artery wall. A thoracic aortic aneurysm is an aneurysm that occurs in the first part of the aorta, between the heart and the diaphragm. The aorta is the main artery of the body. It supplies blood from the heart to the rest of the body. Some aneurysms may not cause symptoms or problems. However, a thoracic aortic aneurysm can cause two serious problems:  It can enlarge and burst (rupture).  It can cause blood to flow between the layers of the wall of the aorta through a tear (aortic dissection). Both of these problems are medical emergencies. They can cause bleeding inside the body and can be life-threatening if they are not diagnosed and treated right away. What are the causes? The exact cause of this condition is not known. What increases the risk? The following factors may make you more likely to develop this condition:  Being 51 years of age or older.  Having a family history of aneurysms.  Using tobacco.  Having any of these conditions: ? Hardening of the arteries caused by the buildup of fat and other substances in the lining of a blood vessel (arteriosclerosis). ? Inflammation of the walls of an artery (arteritis). ? A genetic disease that weakens the body's connective tissue, such as Marfan syndrome. ? An injury or trauma to the aorta. ? High blood pressure (hypertension). ? High cholesterol. ? An infection from bacteria, such as syphilis or staphylococcus, in the wall of the aorta (infectious aortitis). What are the signs or symptoms? Symptoms of this condition vary depending on the size of the aneurysm and how fast it is growing. Most grow  slowly and do not cause symptoms. When symptoms do occur, they may include:  Pain in the chest, back, sides, or abdomen. The pain may vary in intensity. Sudden, severe pain may indicate that the aneurysm has ruptured.  Hoarseness.  Cough.  Shortness of breath.  Swallowing problems.  Swelling in the face, arms, or legs.  Fever.  Unexplained weight loss. How is this diagnosed? This condition may be diagnosed with:  An ultrasound.  X-rays.  CT scan.  MRI.  A test to check the arteries for damage or blockages (angiogram). Most unruptured thoracic aortic aneurysms cause no symptoms, so they are often found during exams for other conditions. How is this treated? Treatment for this condition depends on:  The size of the aneurysm.  How fast the aneurysm is growing.  Your age.  Risk factors for rupture. Small aneurysms (2.2 inches, or 5.5 cm, or less) may be managed with:  Medicines to: ? Control blood pressure. ? Manage pain. ? Fight infection.  Regular monitoring. This may include an ultrasound or CT scan every year or every 6 months to see if the aneurysm is getting bigger. Large or fast-growing aneurysms may be treated with surgery. Follow these instructions at home: Eating and drinking   Eat a healthy diet. Your health care provider may recommend that you: ? Lower your salt (sodium) intake. In some people, too much salt can raise blood pressure and increase the risk for thoracic aortic aneurysm. ? Avoid foods that are high  in saturated fat and cholesterol, such as red meat and full-fat dairy. ? Eat a diet that is low in sugar. ? Increase your fiber intake by including whole grains, vegetables, and fruits in your diet. Eating these foods may help to lower your blood pressure.  Do not drink alcohol if your health care provider tells you not to drink.  If you drink alcohol: ? Limit how much you use to:  0-1 drink a day for women.  0-2 drinks a day for  men. ? Be aware of how much alcohol is in your drink. In the [place] one drink equals one 12 oz bottle of beer (355 mL), one 5 oz glass of wine (148 mL), or one 1 oz glass of hard liquor (44 mL). Lifestyle  Do not use any products that contain nicotine or tobacco, such as cigarettes, e-cigarettes, and chewing tobacco. If you need help quitting, ask your health care provider.  Maintain a healthy weight.  Check your blood pressure regularly. Follow your health care provider's instructions on how to keep your blood pressure within normal limits.  Have your blood sugar (glucose) level and cholesterol levels checked regularly. Follow your health care provider's instructions on how to keep levels within normal limits. Activity   Stay physically active and exercise regularly. Talk with your health care provider about how often you should exercise and ask which types of exercise are [place] for you.  Avoid heavy lifting and activities that take a lot of effort. Ask your health care provider what activities are safe for you. General instructions  Take over-the-counter and prescription medicines only as told by your health care provider.  Talk with your health care provider about regular screenings to see if the aneurysm is getting bigger.  Keep all follow-up visits as told by your health care provider. This is important. Contact a health care provider if you have:  Unexplained weight loss. Get help right away if you have:  Pain in your upper back, neck, or abdomen. This pain may move into your chest and arms.  Trouble swallowing.  A cough or hoarseness.  Shortness of breath. Summary  A thoracic aortic aneurysm is an aneurysm that occurs in the first part of the aorta, between the heart and the diaphragm.  As a thoracic aortic aneurysm becomes larger, it can burst (rupture), or blood can flow between the layers of the wall of the aorta through a tear (aorticdissection). These conditions can  be life-threatening if they are not diagnosed and treated right away.  If you have a thoracic aortic aneurysm, its growth will be closely monitored. Surgical repair may be needed for larger or faster-growing aneurysms. This information is not intended to replace advice given to you by your health care provider. Make sure you discuss any questions you have with your health care provider. Document Revised: 08/28/2017 Document Reviewed: 08/29/2017 Elsevier Patient Education  [place].

## 2019-04-18 NOTE — Telephone Encounter (Signed)
I called and spoke with patient. He denies having any cough or fever. I advised patient that his appointment would be in office. Patient verbalized understanding.

## 2019-04-23 ENCOUNTER — Other Ambulatory Visit: Payer: Self-pay | Admitting: Family Medicine

## 2019-04-23 DIAGNOSIS — M5020 Other cervical disc displacement, unspecified cervical region: Secondary | ICD-10-CM

## 2019-04-23 MED ORDER — HYDROCODONE-ACETAMINOPHEN 5-325 MG PO TABS: 1.0000 | ORAL_TABLET | Freq: Four times a day (QID) | ORAL | 0 refills | Status: DC | PRN

## 2019-05-06 ENCOUNTER — Ambulatory Visit
Admission: RE | Admit: 2019-05-06 | Discharge: 2019-05-06 | Disposition: A | Discharge status: Home / Self Care | Payer: BC Managed Care – PPO | Source: Ambulatory Visit | Attending: Family Medicine | Admitting: Family Medicine

## 2019-05-06 ENCOUNTER — Other Ambulatory Visit: Payer: Self-pay

## 2019-05-06 DIAGNOSIS — I712 Thoracic aortic aneurysm, without rupture: Secondary | ICD-10-CM | POA: Diagnosis not present

## 2019-05-06 DIAGNOSIS — I7121 Aneurysm of the ascending aorta, without rupture: Secondary | ICD-10-CM

## 2019-05-06 MED ORDER — IOHEXOL 350 MG/ML SOLN
75.0000 mL | Freq: Once | INTRAVENOUS | Status: AC | PRN
  Administered 2019-05-06: 75 mL via INTRAVENOUS

## 2019-05-08 ENCOUNTER — Other Ambulatory Visit: Payer: Self-pay | Admitting: Family Medicine

## 2019-05-08 DIAGNOSIS — M5020 Other cervical disc displacement, unspecified cervical region: Secondary | ICD-10-CM

## 2019-05-08 MED ORDER — HYDROCODONE-ACETAMINOPHEN 5-325 MG PO TABS: 1.0000 | ORAL_TABLET | Freq: Four times a day (QID) | ORAL | 0 refills | Status: DC | PRN

## 2019-05-22 ENCOUNTER — Other Ambulatory Visit: Payer: Self-pay | Admitting: Family Medicine

## 2019-05-22 DIAGNOSIS — M5020 Other cervical disc displacement, unspecified cervical region: Secondary | ICD-10-CM

## 2019-05-22 MED ORDER — HYDROCODONE-ACETAMINOPHEN 5-325 MG PO TABS: 1.0000 | ORAL_TABLET | Freq: Four times a day (QID) | ORAL | 0 refills | Status: DC | PRN

## 2019-06-06 ENCOUNTER — Other Ambulatory Visit: Payer: Self-pay | Admitting: Family Medicine

## 2019-06-06 DIAGNOSIS — M5020 Other cervical disc displacement, unspecified cervical region: Secondary | ICD-10-CM

## 2019-06-06 MED ORDER — HYDROCODONE-ACETAMINOPHEN 5-325 MG PO TABS: 1.0000 | ORAL_TABLET | Freq: Four times a day (QID) | ORAL | 0 refills | Status: DC | PRN

## 2019-06-20 ENCOUNTER — Other Ambulatory Visit: Payer: Self-pay | Admitting: Family Medicine

## 2019-06-20 DIAGNOSIS — M5020 Other cervical disc displacement, unspecified cervical region: Secondary | ICD-10-CM

## 2019-06-20 MED ORDER — HYDROCODONE-ACETAMINOPHEN 5-325 MG PO TABS: 1.0000 | ORAL_TABLET | Freq: Four times a day (QID) | ORAL | 0 refills | Status: DC | PRN

## 2019-07-07 ENCOUNTER — Other Ambulatory Visit: Payer: Self-pay | Admitting: Family Medicine

## 2019-07-07 DIAGNOSIS — M5020 Other cervical disc displacement, unspecified cervical region: Secondary | ICD-10-CM

## 2019-07-07 MED ORDER — HYDROCODONE-ACETAMINOPHEN 5-325 MG PO TABS: 1.0000 | ORAL_TABLET | Freq: Four times a day (QID) | ORAL | 0 refills | Status: DC | PRN

## 2019-07-21 ENCOUNTER — Other Ambulatory Visit: Payer: Self-pay | Admitting: Family Medicine

## 2019-07-21 DIAGNOSIS — M5020 Other cervical disc displacement, unspecified cervical region: Secondary | ICD-10-CM

## 2019-07-21 MED ORDER — HYDROCODONE-ACETAMINOPHEN 5-325 MG PO TABS: 1.0000 | ORAL_TABLET | Freq: Four times a day (QID) | ORAL | 0 refills | Status: DC | PRN

## 2019-08-04 ENCOUNTER — Other Ambulatory Visit: Payer: Self-pay | Admitting: Family Medicine

## 2019-08-04 DIAGNOSIS — M5020 Other cervical disc displacement, unspecified cervical region: Secondary | ICD-10-CM

## 2019-08-04 MED ORDER — HYDROCODONE-ACETAMINOPHEN 5-325 MG PO TABS: 1.0000 | ORAL_TABLET | Freq: Four times a day (QID) | ORAL | 0 refills | Status: DC | PRN

## 2019-08-14 NOTE — Progress Notes (Signed)
Established patient visit   Patient: Devin Parsons   DOB: 16-Jun-1970   49 y.o. Male  MRN: [id number] Visit Date: 08/18/2019  Today's healthcare provider: Lelon Huh, MD   Chief Complaint  Patient presents with  . Hyperlipidemia  . Hypertension  . Pain   Subjective    HPI  Hypertension, follow-up  BP Readings from Last 3 Encounters:  08/18/19 (!) 180/116  04/18/19 (!) 122/100  04/16/19 (!) 137/113   Wt Readings from Last 3 Encounters:  08/18/19 (!) 240 lb (108.9 kg)  04/18/19 240 lb (108.9 kg)  04/16/19 240 lb (108.9 kg)     He was last seen for hypertension 4 months ago.  BP at that visit was 122/100. Management since that visit includes continue same medication.  He reports excellent compliance with treatment. He is not having side effects.  He is following a Low Sodium diet. He is not exercising. He does smoke.  Use of agents associated with hypertension: none.   Outside blood pressures are not being checked. Symptoms: No chest pain No chest pressure  No palpitations No syncope  No dyspnea No orthopnea  No paroxysmal nocturnal dyspnea No lower extremity edema   Pertinent labs: Lab Results  Component Value Date   CHOL 181 10/09/2018   HDL 30 (L) 10/09/2018   LDLCALC 112 (H) 10/09/2018   TRIG 223 (H) 10/09/2018   CHOLHDL 6.0 (H) 10/09/2018   Lab Results  Component Value Date   NA 138 04/16/2019   K 3.9 04/16/2019   CREATININE 1.00 04/16/2019   GFRNONAA >60 04/16/2019   GFRAA >60 04/16/2019   GLUCOSE 107 (H) 04/16/2019     The 10-year ASCVD risk score Mikey Bussing DC Jr., et al., 2013) is: 23.9%   ---------------------------------------------------------------------------------------------------  Follow up for SVT (supraventricular tachycardia):  The patient was last seen for this 4 months ago. Changes made at last visit include continue same medications.  He reports excellent compliance with treatment. He feels that condition is  Stable. He is not having side effects.   -----------------------------------------------------------------------------------------  Lipid/Cholesterol, Follow-up  Last lipid panel Other pertinent labs  Lab Results  Component Value Date   CHOL 181 10/09/2018   HDL 30 (L) 10/09/2018   LDLCALC 112 (H) 10/09/2018   TRIG 223 (H) 10/09/2018   CHOLHDL 6.0 (H) 10/09/2018   Lab Results  Component Value Date   ALT 24 04/16/2019   AST 14 (L) 04/16/2019   PLT 442 (H) 04/16/2019   TSH 1.840 08/31/2015     He was last seen for this 10 months ago.  Management since that visit includes recommend starting rosuvastatin 10mg  once a day. Patient did not start medication.  He reports excellent compliance with treatment. He is not having side effects.   Symptoms: No chest pain No chest pressure/discomfort  No dyspnea No lower extremity edema  No numbness or tingling of extremity No orthopnea  No palpitations No paroxysmal nocturnal dyspnea  No speech difficulty No syncope   Current diet: in general, a "healthy" diet   Current exercise: none  The 10-year ASCVD risk score Mikey Bussing DC Jr., et al., 2013) is: 23.9%  ---------------------------------------------------------------------------------------------------  Follow up for Chronic pain:  The patient was last seen for this more than 1 year ago.  Changes made at last visit include none; continue same medication.  He reports excellent compliance with treatment. He feels that condition is Stable. He is not having side effects.   -----------------------------------------------------------------------------------------       Medications:  Outpatient Medications Prior to Visit  Medication Sig  . buPROPion (WELLBUTRIN SR) 150 MG 12 hr tablet TAKE 1 TABLET BY MOUTH TWICE DAILY  . HYDROcodone-acetaminophen (NORCO/VICODIN) 5-325 MG tablet Take 1 tablet by mouth every 6 (six) hours as needed for moderate pain.  . hyoscyamine (LEVSIN SL)  0.125 MG SL tablet DISSOLVE 1 TABLET(0.125 MG) UNDER THE TONGUE EVERY 4 HOURS AS NEEDED  . ibuprofen (ADVIL,MOTRIN) 200 MG tablet Take 400-600 mg by mouth 2 (two) times daily as needed for headache or mild pain.   Marland Kitchen losartan-hydrochlorothiazide (HYZAAR) 100-25 MG tablet Take 1 tablet by mouth daily.  . meloxicam (MOBIC) 15 MG tablet TAKE 1 TABLET BY MOUTH EVERY DAY AS NEEDED FOR PAIN, REPLACES NABUMETONE  . PARoxetine (PAXIL) 20 MG tablet Take 1 tablet (20 mg total) by mouth daily.  . verapamil (CALAN-SR) 180 MG CR tablet TAKE 1 TABLET(180 MG) BY MOUTH TWICE DAILY  . albuterol (VENTOLIN HFA) 108 (90 Base) MCG/ACT inhaler Inhale 1-2 puffs into the lungs every 6 (six) hours as needed for shortness of breath. (Patient not taking: Reported on 08/18/2019)  . famotidine (PEPCID) 20 MG tablet Take 1 tablet (20 mg total) by mouth 2 (two) times daily. (Patient not taking: Reported on 08/18/2019)  . ondansetron (ZOFRAN ODT) 4 MG disintegrating tablet Take 1 tablet (4 mg total) by mouth every 8 (eight) hours as needed for nausea or vomiting. (Patient not taking: Reported on 08/18/2019)   No facility-administered medications prior to visit.    Review of Systems  Constitutional: Negative.   Respiratory: Negative.   Cardiovascular: Negative.   Gastrointestinal: Negative.   Neurological: Negative for dizziness, light-headedness and headaches.      Objective    BP (!) 180/116 (BP Location: Right Arm, Patient Position: Sitting, Cuff Size: Large)   Pulse 77   Temp 97.7 F (36.5 C) (Temporal)   Wt (!) 240 lb (108.9 kg)   SpO2 96%   BMI 33.47 kg/m    Physical Exam  General appearance: Obese male, cooperative and in no acute distress Head: Normocephalic, without obvious abnormality, atraumatic Respiratory: Respirations even and unlabored, normal respiratory rate Extremities: All extremities are intact.  Skin: Skin color, texture, turgor normal. No rashes seen  Psych: Appropriate mood and  affect. Neurologic: Mental status: Alert, oriented to person, place, and time, thought content appropriate.     Assessment & Plan     1. SVT (supraventricular tachycardia) (HCC) Rate well controlled, but BP very high. Increase from 180mg  to - verapamil (CALAN-SR) 240 MG CR tablet; Take 1 tablet (240 mg total) by mouth 2 (two) times daily. 0 tablet; Refill: 1  2. Essential hypertension uncontrolled - Comprehensive metabolic panel - Lipid panel  Increase from 180 to - verapamil (CALAN-SR) 240 MG CR tablet; Take 1 tablet (240 mg total) by mouth 2 (two) times daily. Dispense: 60 tablet; Refill: 1  3. Displacement of cervical intervertebral disc without myelopathy   4. Chronic low back pain without sciatica, unspecified back pain laterality Fairly well controlled with current dose of hydrocodone/apap. No sign of diversion or abuse. Continue current medications.     The 10-year ASCVD risk score Mikey Bussing DC Jr., et al., 2013) is: 22.6%  He declined statin prescription last fall and has been working on improving diet.   No follow-ups on file.      The entirety of the information documented in the History of Present Illness, Review of Systems and Physical Exam were personally obtained by me. Portions of this information  were initially documented by the CMA and reviewed by me for thoroughness and accuracy.      Lelon Huh, MD  [facility] [phone] (phone) [phone] (fax)  [place]

## 2019-08-18 ENCOUNTER — Ambulatory Visit: Payer: BC Managed Care – PPO | Admitting: Family Medicine

## 2019-08-18 ENCOUNTER — Encounter: Payer: Self-pay | Admitting: Family Medicine

## 2019-08-18 ENCOUNTER — Other Ambulatory Visit: Payer: Self-pay

## 2019-08-18 VITALS — BP 174/110 | HR 77 | Temp 97.7°F | Wt 240.0 lb

## 2019-08-18 DIAGNOSIS — M5020 Other cervical disc displacement, unspecified cervical region: Secondary | ICD-10-CM | POA: Diagnosis not present

## 2019-08-18 DIAGNOSIS — M545 Low back pain: Secondary | ICD-10-CM

## 2019-08-18 DIAGNOSIS — I10 Essential (primary) hypertension: Secondary | ICD-10-CM

## 2019-08-18 DIAGNOSIS — I471 Supraventricular tachycardia: Secondary | ICD-10-CM | POA: Diagnosis not present

## 2019-08-18 DIAGNOSIS — G8929 Other chronic pain: Secondary | ICD-10-CM

## 2019-08-18 MED ORDER — VERAPAMIL HCL ER 240 MG PO TBCR: 240.0000 mg | EXTENDED_RELEASE_TABLET | Freq: Two times a day (BID) | ORAL | 1 refills | Status: DC

## 2019-08-18 MED ORDER — HYDROCODONE-ACETAMINOPHEN 5-325 MG PO TABS: 1.0000 | ORAL_TABLET | Freq: Four times a day (QID) | ORAL | 0 refills | Status: DC | PRN

## 2019-08-19 LAB — LIPID PANEL
Chol/HDL Ratio: 5.1 ratio — ABNORMAL HIGH (ref 0.0–5.0)
Cholesterol, Total: 175 mg/dL (ref 100–199)
HDL: 34 mg/dL — ABNORMAL LOW
LDL Chol Calc (NIH): 119 mg/dL — ABNORMAL HIGH (ref 0–99)
Triglycerides: 120 mg/dL (ref 0–149)
VLDL Cholesterol Cal: 22 mg/dL (ref 5–40)

## 2019-08-19 LAB — COMPREHENSIVE METABOLIC PANEL WITH GFR
ALT: 27 [IU]/L (ref 0–44)
AST: 16 [IU]/L (ref 0–40)
Albumin/Globulin Ratio: 1.6 (ref 1.2–2.2)
Albumin: 4.4 g/dL (ref 3.8–4.9)
Alkaline Phosphatase: 84 [IU]/L (ref 48–121)
BUN/Creatinine Ratio: 12 (ref 9–20)
BUN: 11 mg/dL (ref 6–24)
Bilirubin Total: 0.3 mg/dL (ref 0.0–1.2)
CO2: 22 mmol/L (ref 20–29)
Calcium: 9.4 mg/dL (ref 8.7–10.2)
Chloride: 104 mmol/L (ref 96–106)
Creatinine, Ser: 0.89 mg/dL (ref 0.76–1.27)
GFR calc Af Amer: 114 mL/min/{1.73_m2}
GFR calc non Af Amer: 99 mL/min/{1.73_m2}
Globulin, Total: 2.7 g/dL (ref 1.5–4.5)
Glucose: 86 mg/dL (ref 65–99)
Potassium: 3.9 mmol/L (ref 3.5–5.2)
Sodium: 142 mmol/L (ref 134–144)
Total Protein: 7.1 g/dL (ref 6.0–8.5)

## 2019-09-01 ENCOUNTER — Other Ambulatory Visit: Payer: Self-pay | Admitting: Family Medicine

## 2019-09-01 DIAGNOSIS — M545 Low back pain, unspecified: Secondary | ICD-10-CM

## 2019-09-01 DIAGNOSIS — M5020 Other cervical disc displacement, unspecified cervical region: Secondary | ICD-10-CM

## 2019-09-01 DIAGNOSIS — G8929 Other chronic pain: Secondary | ICD-10-CM

## 2019-09-01 MED ORDER — HYDROCODONE-ACETAMINOPHEN 5-325 MG PO TABS: 1.0000 | ORAL_TABLET | Freq: Four times a day (QID) | ORAL | 0 refills | Status: DC | PRN

## 2019-09-09 DIAGNOSIS — H5203 Hypermetropia, bilateral: Secondary | ICD-10-CM | POA: Diagnosis not present

## 2019-09-17 ENCOUNTER — Other Ambulatory Visit: Payer: Self-pay | Admitting: Family Medicine

## 2019-09-17 DIAGNOSIS — G8929 Other chronic pain: Secondary | ICD-10-CM

## 2019-09-17 DIAGNOSIS — M5020 Other cervical disc displacement, unspecified cervical region: Secondary | ICD-10-CM

## 2019-09-17 MED ORDER — HYDROCODONE-ACETAMINOPHEN 5-325 MG PO TABS: 1.0000 | ORAL_TABLET | Freq: Four times a day (QID) | ORAL | 0 refills | Status: DC | PRN

## 2019-09-30 ENCOUNTER — Ambulatory Visit: Payer: BC Managed Care – PPO | Admitting: Family Medicine

## 2019-10-01 ENCOUNTER — Other Ambulatory Visit: Payer: Self-pay | Admitting: Family Medicine

## 2019-10-01 DIAGNOSIS — M545 Low back pain, unspecified: Secondary | ICD-10-CM

## 2019-10-01 DIAGNOSIS — M5020 Other cervical disc displacement, unspecified cervical region: Secondary | ICD-10-CM

## 2019-10-01 DIAGNOSIS — G8929 Other chronic pain: Secondary | ICD-10-CM

## 2019-10-01 MED ORDER — HYDROCODONE-ACETAMINOPHEN 5-325 MG PO TABS: 1.0000 | ORAL_TABLET | Freq: Four times a day (QID) | ORAL | 0 refills | Status: DC | PRN

## 2019-10-01 NOTE — Telephone Encounter (Signed)
Please advise 

## 2019-10-16 ENCOUNTER — Other Ambulatory Visit: Payer: Self-pay | Admitting: Family Medicine

## 2019-10-16 DIAGNOSIS — M545 Low back pain, unspecified: Secondary | ICD-10-CM

## 2019-10-16 DIAGNOSIS — G8929 Other chronic pain: Secondary | ICD-10-CM

## 2019-10-16 DIAGNOSIS — M5020 Other cervical disc displacement, unspecified cervical region: Secondary | ICD-10-CM

## 2019-10-16 NOTE — Telephone Encounter (Signed)
L.O.V. was on 08/18/19 and next visit is 11/04/19. Last refill on medication was on 10/01/19. Please advised if medication can be refilled this soon.

## 2019-10-17 ENCOUNTER — Telehealth: Payer: Self-pay | Admitting: Family Medicine

## 2019-10-17 NOTE — Telephone Encounter (Signed)
Patient is call back to check on the status of his medication refill. Patient states he only gets a 2 week dosage please advise

## 2019-10-17 NOTE — Telephone Encounter (Signed)
Patient is calling to check on the status of his medication refill Please advise CB- [phone]

## 2019-10-18 MED ORDER — HYDROCODONE-ACETAMINOPHEN 5-325 MG PO TABS: 1.0000 | ORAL_TABLET | Freq: Four times a day (QID) | ORAL | 0 refills | Status: DC | PRN

## 2019-10-20 NOTE — Telephone Encounter (Signed)
Medication was sent to patient's pharmacy on 9/25.

## 2019-10-31 ENCOUNTER — Other Ambulatory Visit: Payer: Self-pay | Admitting: Family Medicine

## 2019-10-31 DIAGNOSIS — M5020 Other cervical disc displacement, unspecified cervical region: Secondary | ICD-10-CM

## 2019-10-31 DIAGNOSIS — G8929 Other chronic pain: Secondary | ICD-10-CM

## 2019-10-31 DIAGNOSIS — M545 Low back pain, unspecified: Secondary | ICD-10-CM

## 2019-10-31 MED ORDER — HYDROCODONE-ACETAMINOPHEN 5-325 MG PO TABS: 1.0000 | ORAL_TABLET | Freq: Four times a day (QID) | ORAL | 0 refills | Status: DC | PRN

## 2019-11-04 ENCOUNTER — Encounter: Payer: Self-pay | Admitting: Family Medicine

## 2019-11-04 ENCOUNTER — Other Ambulatory Visit: Payer: Self-pay

## 2019-11-04 ENCOUNTER — Ambulatory Visit: Payer: BC Managed Care – PPO | Admitting: Family Medicine

## 2019-11-04 VITALS — BP 162/102 | HR 98 | Temp 98.2°F | Ht 71.0 in | Wt 242.0 lb

## 2019-11-04 DIAGNOSIS — I10 Essential (primary) hypertension: Secondary | ICD-10-CM

## 2019-11-04 DIAGNOSIS — I471 Supraventricular tachycardia: Secondary | ICD-10-CM

## 2019-11-04 NOTE — Progress Notes (Signed)
Established patient visit   Patient: Devin Parsons   DOB: 1970-08-28   49 y.o. Male  MRN: [id number] Visit Date: 11/04/2019  Today's healthcare provider: Lelon Huh, MD   Chief Complaint  Patient presents with  . Hypertension   Subjective    HPI  Hypertension, follow-up  BP Readings from Last 3 Encounters:  11/04/19 (!) 163/114  08/18/19 (!) 174/110  04/18/19 (!) 122/100   Wt Readings from Last 3 Encounters:  11/04/19 242 lb (109.8 kg)  08/18/19 (!) 240 lb (108.9 kg)  04/18/19 240 lb (108.9 kg)     He was last seen for hypertension 3 months ago.  BP at that visit was 174/110. Management since that visit includes increased from Verapamil 180 mg to 240 mg.  He reports good compliance with treatment. He is not having side effects.  He is following a Regular diet. He is not exercising. He does smoke.  Use of agents associated with hypertension: none.   Outside blood pressures are being checked at home. BP readings are elevated per patient. Symptoms: No chest pain No chest pressure  No palpitations No syncope  No dyspnea No orthopnea  No paroxysmal nocturnal dyspnea No lower extremity edema   Pertinent labs: Lab Results  Component Value Date   CHOL 175 08/18/2019   HDL 34 (L) 08/18/2019   LDLCALC 119 (H) 08/18/2019   TRIG 120 08/18/2019   CHOLHDL 5.1 (H) 08/18/2019   Lab Results  Component Value Date   NA 142 08/18/2019   K 3.9 08/18/2019   CREATININE 0.89 08/18/2019   GFRNONAA 99 08/18/2019   GFRAA 114 08/18/2019   GLUCOSE 86 08/18/2019     The 10-year ASCVD risk score Mikey Bussing DC Jr., et al., 2013) is: 17.4%   ---------------------------------------------------------------------------------------------------      Medications: Outpatient Medications Prior to Visit  Medication Sig  . buPROPion (WELLBUTRIN SR) 150 MG 12 hr tablet TAKE 1 TABLET BY MOUTH TWICE DAILY  . famotidine (PEPCID) 20 MG tablet Take 1 tablet (20 mg total) by mouth 2  (two) times daily.  Marland Kitchen HYDROcodone-acetaminophen (NORCO/VICODIN) 5-325 MG tablet Take 1 tablet by mouth every 6 (six) hours as needed for moderate pain.  . hyoscyamine (LEVSIN SL) 0.125 MG SL tablet DISSOLVE 1 TABLET(0.125 MG) UNDER THE TONGUE EVERY 4 HOURS AS NEEDED  . ibuprofen (ADVIL,MOTRIN) 200 MG tablet Take 400-600 mg by mouth 2 (two) times daily as needed for headache or mild pain.   Marland Kitchen losartan-hydrochlorothiazide (HYZAAR) 100-25 MG tablet Take 1 tablet by mouth daily.  . meloxicam (MOBIC) 15 MG tablet TAKE 1 TABLET BY MOUTH EVERY DAY AS NEEDED FOR PAIN, REPLACES NABUMETONE  . PARoxetine (PAXIL) 20 MG tablet Take 1 tablet (20 mg total) by mouth daily.  . verapamil (CALAN-SR) 240 MG CR tablet Take 1 tablet (240 mg total) by mouth 2 (two) times daily. Please note change in quantity from 180 to #60   No facility-administered medications prior to visit.    Review of Systems  Constitutional: Negative.   Respiratory: Negative.   Cardiovascular: Negative.   Musculoskeletal: Negative.       Objective    BP (!) 163/114 (BP Location: Left Arm, Patient Position: Sitting, Cuff Size: Normal)   Pulse 98   Temp 98.2 F (36.8 C) (Oral)   Ht 5\' 11"  (1.803 m)   Wt 242 lb (109.8 kg)   SpO2 98%   BMI 33.75 kg/m    Physical Exam   General: Appearance:  Obese male in no acute distress  Eyes:    PERRL, conjunctiva/corneas clear, EOM's intact       Lungs:     Clear to auscultation bilaterally, respirations unlabored  Heart:    Normal heart rate. Normal rhythm. No murmurs, rubs, or gallops.   MS:   All extremities are intact.   Neurologic:   Awake, alert, oriented x 3. No apparent focal neurological           defect.        No results found for any visits on 11/04/19.  Assessment & Plan     1. SVT (supraventricular tachycardia) (HCC)  - TSH  2. Primary hypertension No improvement of BP in office or home readings with increased dose of verapamil. Anticipate increasing dose again if  normal thyroid functions. Consider increasing ACEI or change to ARB.  - TSH        The entirety of the information documented in the History of Present Illness, Review of Systems and Physical Exam were personally obtained by me. Portions of this information were initially documented by the CMA and reviewed by me for thoroughness and accuracy.      Lelon Huh, MD  [facility] [phone] (phone) [phone] (fax)  [place]

## 2019-11-05 LAB — TSH: TSH: 1.14 u[IU]/mL (ref 0.450–4.500)

## 2019-11-05 MED ORDER — VALSARTAN-HYDROCHLOROTHIAZIDE 160-25 MG PO TABS: 1.0000 | ORAL_TABLET | Freq: Every day | ORAL | 1 refills | Status: DC

## 2019-11-13 ENCOUNTER — Encounter: Payer: Self-pay | Admitting: Family Medicine

## 2019-11-17 ENCOUNTER — Other Ambulatory Visit: Payer: Self-pay | Admitting: Family Medicine

## 2019-11-17 DIAGNOSIS — G8929 Other chronic pain: Secondary | ICD-10-CM

## 2019-11-17 DIAGNOSIS — M5020 Other cervical disc displacement, unspecified cervical region: Secondary | ICD-10-CM

## 2019-11-17 DIAGNOSIS — M545 Low back pain, unspecified: Secondary | ICD-10-CM

## 2019-11-17 MED ORDER — HYDROCODONE-ACETAMINOPHEN 5-325 MG PO TABS: 1.0000 | ORAL_TABLET | Freq: Four times a day (QID) | ORAL | 0 refills | Status: DC | PRN

## 2019-11-17 NOTE — Telephone Encounter (Signed)
Copied from [place] [id number]. Topic: Quick Communication - Rx Refill/Question >> Nov 17, 2019  2:29 PM Erick Blinks wrote: See Deloris Ping message, pt needs hydrocodone sent [facility] in [place].   Best contact: [phone]

## 2019-12-01 ENCOUNTER — Other Ambulatory Visit: Payer: Self-pay | Admitting: Family Medicine

## 2019-12-01 DIAGNOSIS — M5020 Other cervical disc displacement, unspecified cervical region: Secondary | ICD-10-CM

## 2019-12-01 DIAGNOSIS — M545 Low back pain, unspecified: Secondary | ICD-10-CM

## 2019-12-01 DIAGNOSIS — G8929 Other chronic pain: Secondary | ICD-10-CM

## 2019-12-01 MED ORDER — HYDROCODONE-ACETAMINOPHEN 5-325 MG PO TABS: 1.0000 | ORAL_TABLET | Freq: Four times a day (QID) | ORAL | 0 refills | Status: DC | PRN

## 2019-12-15 ENCOUNTER — Other Ambulatory Visit: Payer: Self-pay | Admitting: Family Medicine

## 2019-12-15 DIAGNOSIS — G8929 Other chronic pain: Secondary | ICD-10-CM

## 2019-12-15 DIAGNOSIS — M5020 Other cervical disc displacement, unspecified cervical region: Secondary | ICD-10-CM

## 2019-12-15 DIAGNOSIS — M545 Low back pain, unspecified: Secondary | ICD-10-CM

## 2019-12-15 MED ORDER — HYDROCODONE-ACETAMINOPHEN 5-325 MG PO TABS: 1.0000 | ORAL_TABLET | Freq: Four times a day (QID) | ORAL | 0 refills | Status: DC | PRN

## 2019-12-29 ENCOUNTER — Other Ambulatory Visit: Payer: Self-pay | Admitting: Family Medicine

## 2019-12-29 DIAGNOSIS — M5020 Other cervical disc displacement, unspecified cervical region: Secondary | ICD-10-CM

## 2019-12-29 DIAGNOSIS — G8929 Other chronic pain: Secondary | ICD-10-CM

## 2019-12-29 DIAGNOSIS — M545 Low back pain, unspecified: Secondary | ICD-10-CM

## 2019-12-29 MED ORDER — HYDROCODONE-ACETAMINOPHEN 5-325 MG PO TABS: 1.0000 | ORAL_TABLET | Freq: Four times a day (QID) | ORAL | 0 refills | Status: DC | PRN

## 2020-01-13 ENCOUNTER — Other Ambulatory Visit: Payer: Self-pay | Admitting: Family Medicine

## 2020-01-13 DIAGNOSIS — M545 Low back pain, unspecified: Secondary | ICD-10-CM

## 2020-01-13 DIAGNOSIS — M5020 Other cervical disc displacement, unspecified cervical region: Secondary | ICD-10-CM

## 2020-01-13 DIAGNOSIS — G8929 Other chronic pain: Secondary | ICD-10-CM

## 2020-01-13 MED ORDER — HYDROCODONE-ACETAMINOPHEN 5-325 MG PO TABS: 1.0000 | ORAL_TABLET | Freq: Four times a day (QID) | ORAL | 0 refills | Status: DC | PRN

## 2020-01-19 NOTE — Telephone Encounter (Signed)
Encounter created in error

## 2020-01-28 ENCOUNTER — Other Ambulatory Visit: Payer: Self-pay | Admitting: Family Medicine

## 2020-01-28 DIAGNOSIS — G8929 Other chronic pain: Secondary | ICD-10-CM

## 2020-01-28 DIAGNOSIS — M545 Low back pain, unspecified: Secondary | ICD-10-CM

## 2020-01-28 DIAGNOSIS — M5020 Other cervical disc displacement, unspecified cervical region: Secondary | ICD-10-CM

## 2020-01-28 MED ORDER — HYDROCODONE-ACETAMINOPHEN 5-325 MG PO TABS: 1.0000 | ORAL_TABLET | Freq: Four times a day (QID) | ORAL | 0 refills | Status: DC | PRN

## 2020-02-11 ENCOUNTER — Other Ambulatory Visit: Payer: Self-pay | Admitting: Family Medicine

## 2020-02-11 DIAGNOSIS — M545 Low back pain, unspecified: Secondary | ICD-10-CM

## 2020-02-11 DIAGNOSIS — M5020 Other cervical disc displacement, unspecified cervical region: Secondary | ICD-10-CM

## 2020-02-11 DIAGNOSIS — G8929 Other chronic pain: Secondary | ICD-10-CM

## 2020-02-11 MED ORDER — HYDROCODONE-ACETAMINOPHEN 5-325 MG PO TABS: 1.0000 | ORAL_TABLET | Freq: Four times a day (QID) | ORAL | 0 refills | Status: DC | PRN

## 2020-02-27 ENCOUNTER — Other Ambulatory Visit: Payer: Self-pay | Admitting: Family Medicine

## 2020-02-27 DIAGNOSIS — M5020 Other cervical disc displacement, unspecified cervical region: Secondary | ICD-10-CM

## 2020-02-27 DIAGNOSIS — M545 Low back pain, unspecified: Secondary | ICD-10-CM

## 2020-02-27 DIAGNOSIS — G8929 Other chronic pain: Secondary | ICD-10-CM

## 2020-02-27 MED ORDER — HYDROCODONE-ACETAMINOPHEN 5-325 MG PO TABS: 1.0000 | ORAL_TABLET | Freq: Four times a day (QID) | ORAL | 0 refills | Status: DC | PRN

## 2020-03-02 DIAGNOSIS — Z85828 Personal history of other malignant neoplasm of skin: Secondary | ICD-10-CM | POA: Diagnosis not present

## 2020-03-02 DIAGNOSIS — D225 Melanocytic nevi of trunk: Secondary | ICD-10-CM | POA: Diagnosis not present

## 2020-03-02 DIAGNOSIS — D485 Neoplasm of uncertain behavior of skin: Secondary | ICD-10-CM | POA: Diagnosis not present

## 2020-03-02 DIAGNOSIS — D2261 Melanocytic nevi of right upper limb, including shoulder: Secondary | ICD-10-CM | POA: Diagnosis not present

## 2020-03-02 DIAGNOSIS — D044 Carcinoma in situ of skin of scalp and neck: Secondary | ICD-10-CM | POA: Diagnosis not present

## 2020-03-02 DIAGNOSIS — D2262 Melanocytic nevi of left upper limb, including shoulder: Secondary | ICD-10-CM | POA: Diagnosis not present

## 2020-03-15 ENCOUNTER — Other Ambulatory Visit: Payer: Self-pay | Admitting: Family Medicine

## 2020-03-15 DIAGNOSIS — G8929 Other chronic pain: Secondary | ICD-10-CM

## 2020-03-15 DIAGNOSIS — M5020 Other cervical disc displacement, unspecified cervical region: Secondary | ICD-10-CM

## 2020-03-15 DIAGNOSIS — M545 Low back pain, unspecified: Secondary | ICD-10-CM

## 2020-03-15 MED ORDER — HYDROCODONE-ACETAMINOPHEN 5-325 MG PO TABS: 1.0000 | ORAL_TABLET | Freq: Four times a day (QID) | ORAL | 0 refills | Status: DC | PRN

## 2020-03-30 ENCOUNTER — Other Ambulatory Visit: Payer: Self-pay | Admitting: Family Medicine

## 2020-03-30 DIAGNOSIS — G8929 Other chronic pain: Secondary | ICD-10-CM

## 2020-03-30 DIAGNOSIS — M545 Low back pain, unspecified: Secondary | ICD-10-CM

## 2020-03-30 DIAGNOSIS — M5020 Other cervical disc displacement, unspecified cervical region: Secondary | ICD-10-CM

## 2020-03-30 MED ORDER — HYDROCODONE-ACETAMINOPHEN 5-325 MG PO TABS: 1.0000 | ORAL_TABLET | Freq: Four times a day (QID) | ORAL | 0 refills | Status: DC | PRN

## 2020-04-14 ENCOUNTER — Other Ambulatory Visit: Payer: Self-pay | Admitting: Family Medicine

## 2020-04-14 DIAGNOSIS — G8929 Other chronic pain: Secondary | ICD-10-CM

## 2020-04-14 DIAGNOSIS — M5020 Other cervical disc displacement, unspecified cervical region: Secondary | ICD-10-CM

## 2020-04-14 MED ORDER — HYDROCODONE-ACETAMINOPHEN 5-325 MG PO TABS: 1.0000 | ORAL_TABLET | Freq: Four times a day (QID) | ORAL | 0 refills | Status: DC | PRN

## 2020-04-28 ENCOUNTER — Telehealth: Payer: Self-pay | Admitting: Family Medicine

## 2020-04-28 NOTE — Telephone Encounter (Signed)
Tried calling patient. Left message to call back. OK for [facility] triage to advise of message and schedule follow up appointment.

## 2020-04-28 NOTE — Telephone Encounter (Signed)
Message read to pt, verbalizes understanding. Scheduled for 05/25/20 at 4pm with Dr. Caryn Section.

## 2020-04-28 NOTE — Telephone Encounter (Signed)
Please advise patient he is due for follow up visit for hypertension since we changed his medications last fall. Needs scheduled within the next month please.

## 2020-04-29 ENCOUNTER — Other Ambulatory Visit: Payer: Self-pay | Admitting: Family Medicine

## 2020-04-29 DIAGNOSIS — D099 Carcinoma in situ, unspecified: Secondary | ICD-10-CM | POA: Diagnosis not present

## 2020-04-29 DIAGNOSIS — M545 Low back pain, unspecified: Secondary | ICD-10-CM

## 2020-04-29 DIAGNOSIS — G8929 Other chronic pain: Secondary | ICD-10-CM

## 2020-04-29 DIAGNOSIS — L578 Other skin changes due to chronic exposure to nonionizing radiation: Secondary | ICD-10-CM | POA: Diagnosis not present

## 2020-04-29 DIAGNOSIS — M5020 Other cervical disc displacement, unspecified cervical region: Secondary | ICD-10-CM

## 2020-04-29 DIAGNOSIS — Z85828 Personal history of other malignant neoplasm of skin: Secondary | ICD-10-CM | POA: Diagnosis not present

## 2020-04-30 MED ORDER — HYDROCODONE-ACETAMINOPHEN 5-325 MG PO TABS: 1.0000 | ORAL_TABLET | Freq: Four times a day (QID) | ORAL | 0 refills | Status: DC | PRN

## 2020-05-10 ENCOUNTER — Telehealth: Payer: Self-pay | Admitting: Family Medicine

## 2020-05-10 ENCOUNTER — Encounter: Payer: Self-pay | Admitting: Family Medicine

## 2020-05-10 NOTE — Telephone Encounter (Signed)
Pt advised as below.  He reports he is feeling some better today (No vomiting).  He will keep pushing fluids and will call back if not improved.   Thanks,   -Mickel Baas

## 2020-05-10 NOTE — Telephone Encounter (Signed)
See telephone message

## 2020-05-10 NOTE — Telephone Encounter (Signed)
Devin Parsons, CMA  You 3 hours ago (8:50 AM)     Please advise,   Message text    Devin Parsons, Devin "Doug"  You 5 hours ago (7:05 AM)      I spent the weekend with a stomach issue. I was throwing up. I haven't eaten since Friday because I have no appetite. There has been a sharp pain. My wife and I had the same food Thursday night so I don't think it's food poisoning. She thinks it maybe an ulcer. I'm not throwing up blood, just bile. This morning I'm still in pain but not as bad as it was over the weekend. I tried to eat this morning but after 3 bites of a pancake I had no appetite.  I have been taking the generic Pepcid the Dr gave me a while back but it only helps a little.  I took today off of work so I can come in if I need to be seen.

## 2020-05-10 NOTE — Telephone Encounter (Signed)
Bland diet, push clear fluids (water and Gatorade). Needs office visit to evaluate if not rapidly improving. Can take OTC omeprazole 20mg  twice a day until office visit. Go to ER or urgent care if gets worse before then.

## 2020-05-13 ENCOUNTER — Other Ambulatory Visit: Payer: Self-pay | Admitting: Family Medicine

## 2020-05-13 DIAGNOSIS — G8929 Other chronic pain: Secondary | ICD-10-CM

## 2020-05-13 DIAGNOSIS — M545 Low back pain, unspecified: Secondary | ICD-10-CM

## 2020-05-13 DIAGNOSIS — M5020 Other cervical disc displacement, unspecified cervical region: Secondary | ICD-10-CM

## 2020-05-13 MED ORDER — HYDROCODONE-ACETAMINOPHEN 5-325 MG PO TABS
1.0000 | ORAL_TABLET | Freq: Four times a day (QID) | ORAL | 0 refills | Status: DC | PRN
Start: 2020-05-13 — End: 2020-05-28

## 2020-05-15 IMAGING — US US ABDOMEN LIMITED
1 series · 14 of 25 positions shown · non-contrast
Comparison: None.

CLINICAL DATA: Upper abdominal pain x4 days.

EXAM:
ULTRASOUND ABDOMEN LIMITED RIGHT UPPER QUADRANT

[Series 1: us abdomen limited ruq · 14 of 45 slices shown]
[im 1/45]
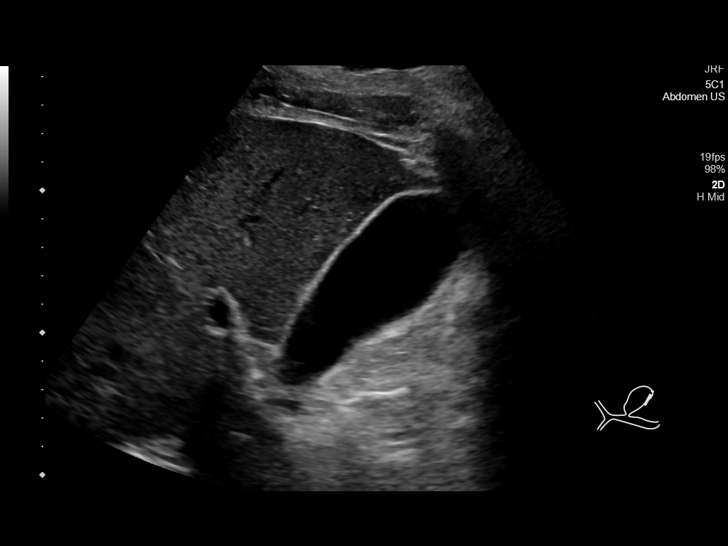
[im 4/45]
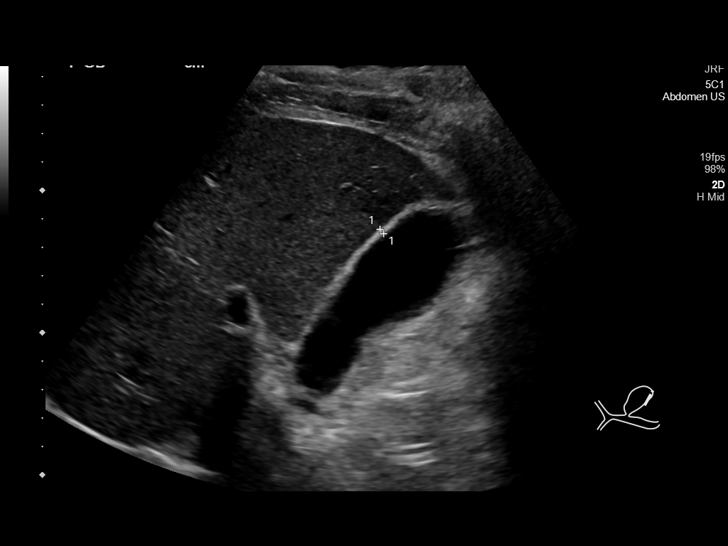
[im 8/45]
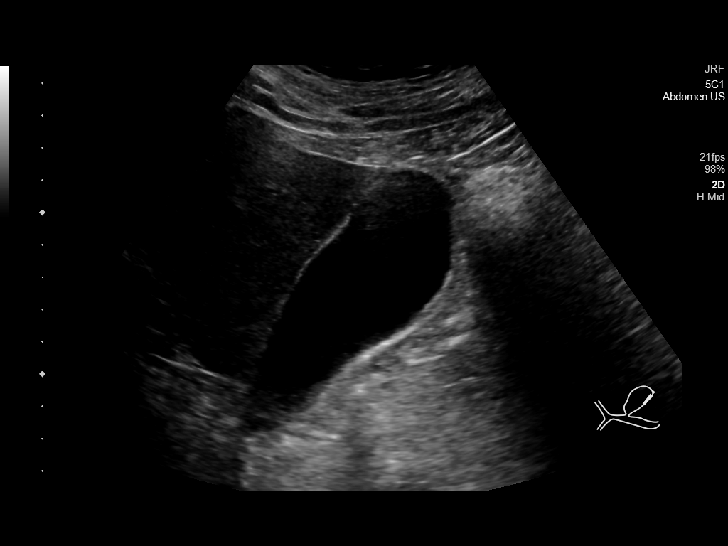
[im 12/45]
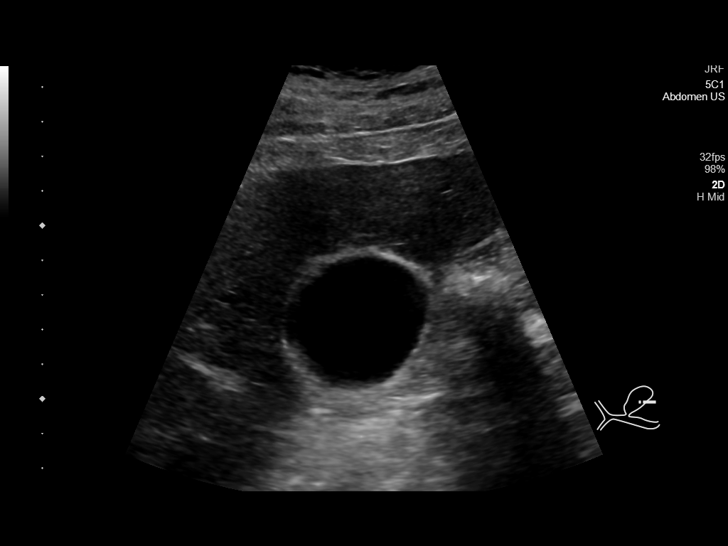
[im 15/45]
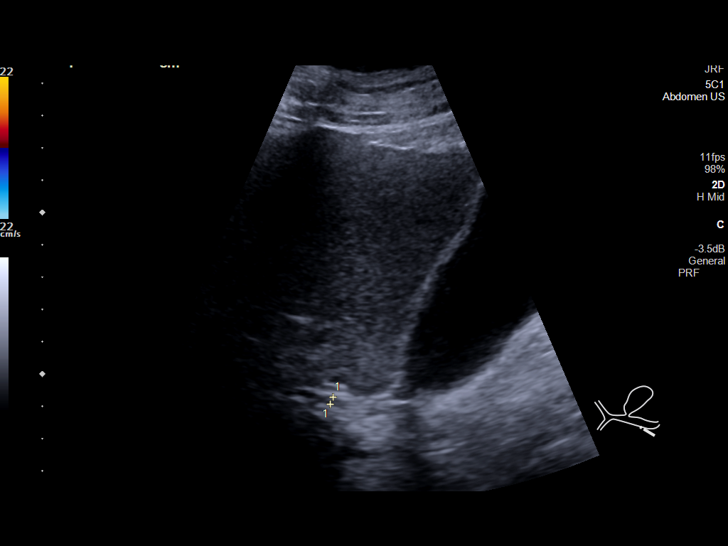
[im 17/45]
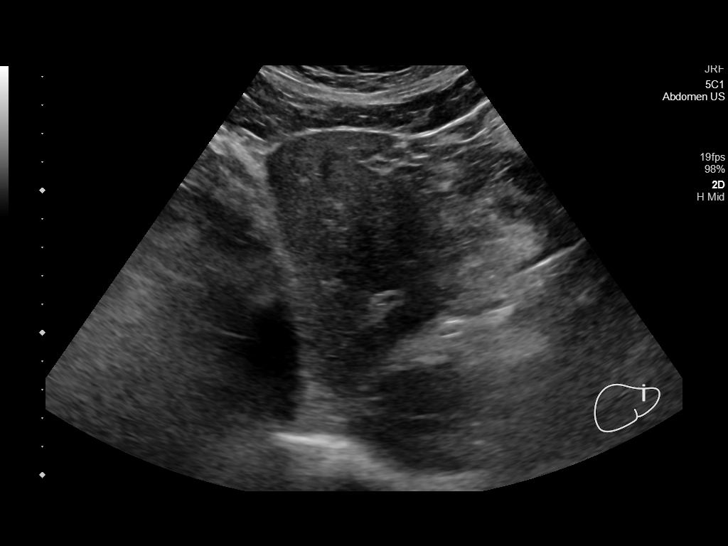
[im 21/45]
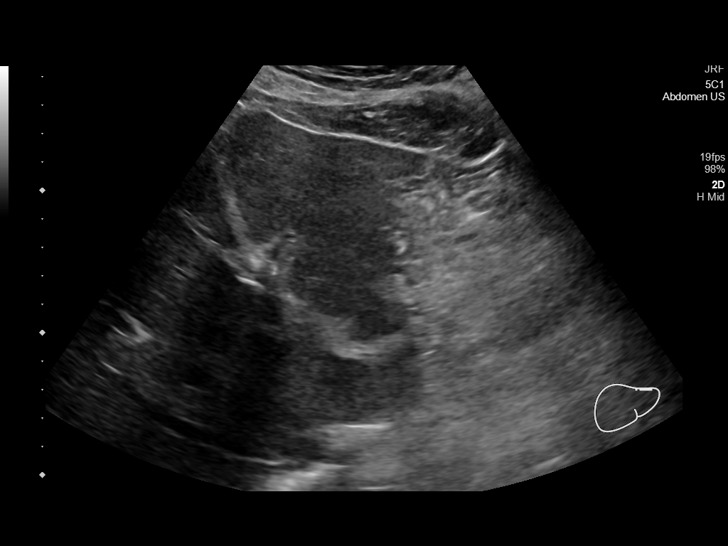
[im 24/45]
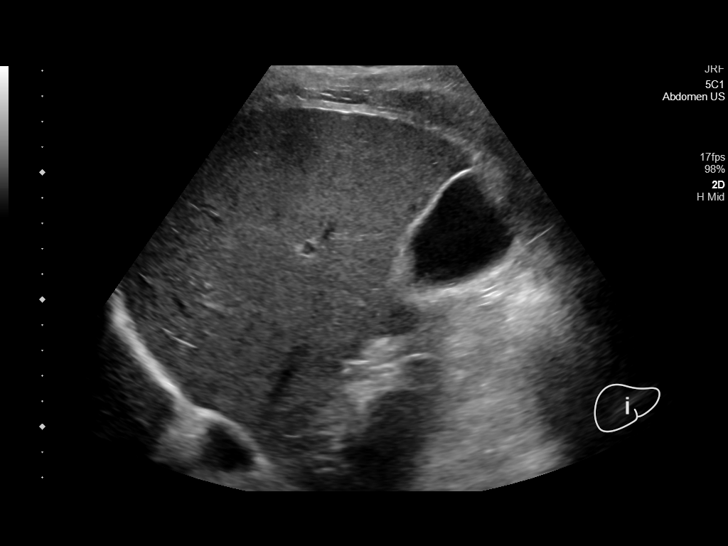
[im 28/45]
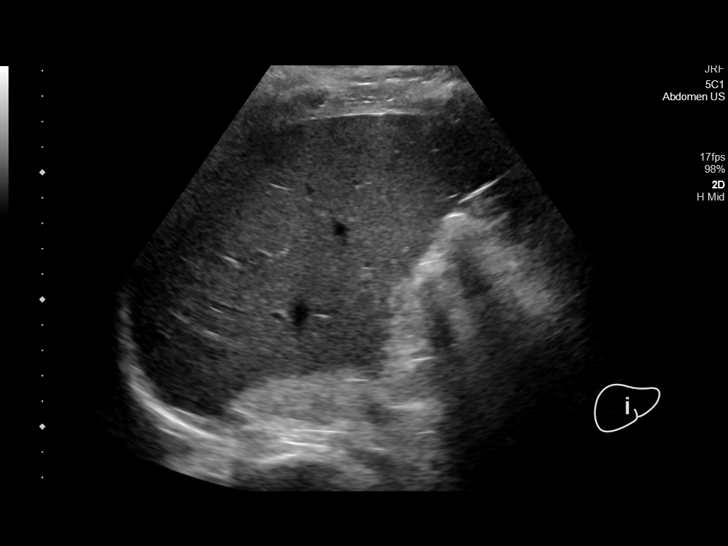
[im 30/45]
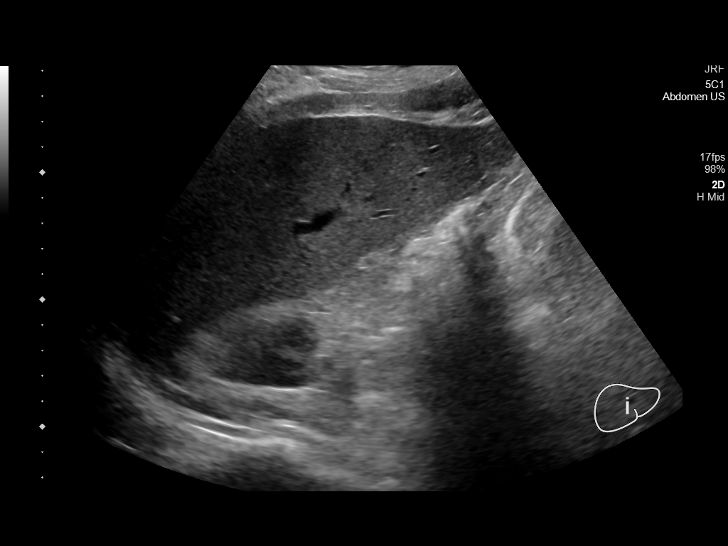
[im 34/45]
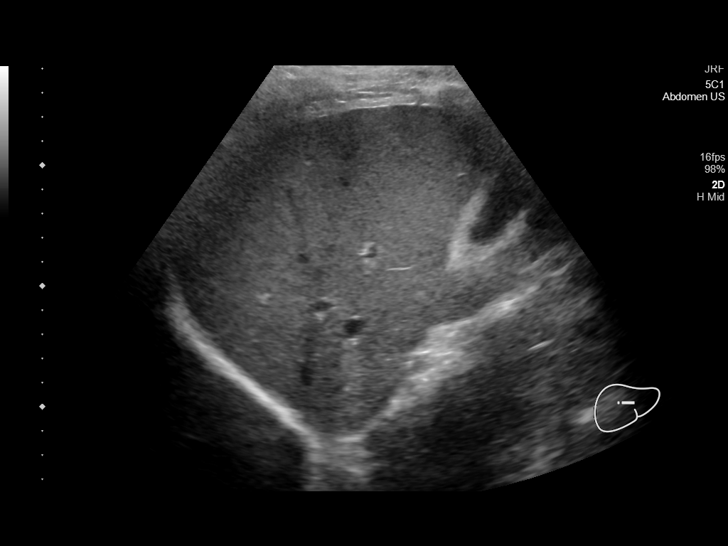
[im 37/45]
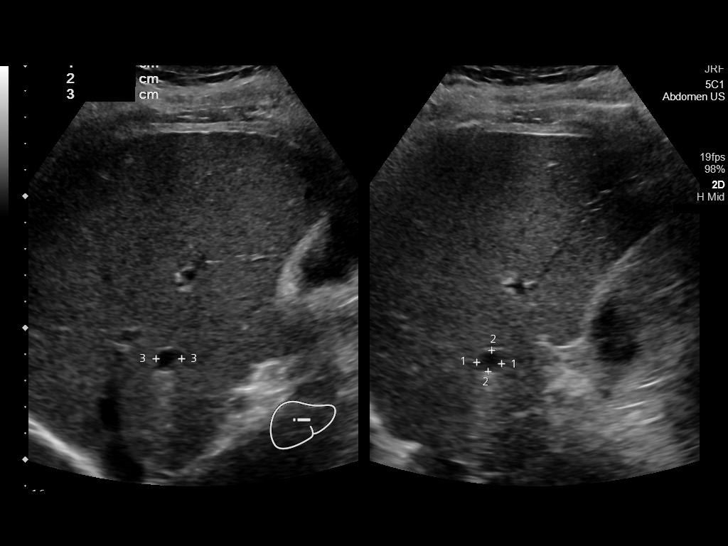
[im 41/45]
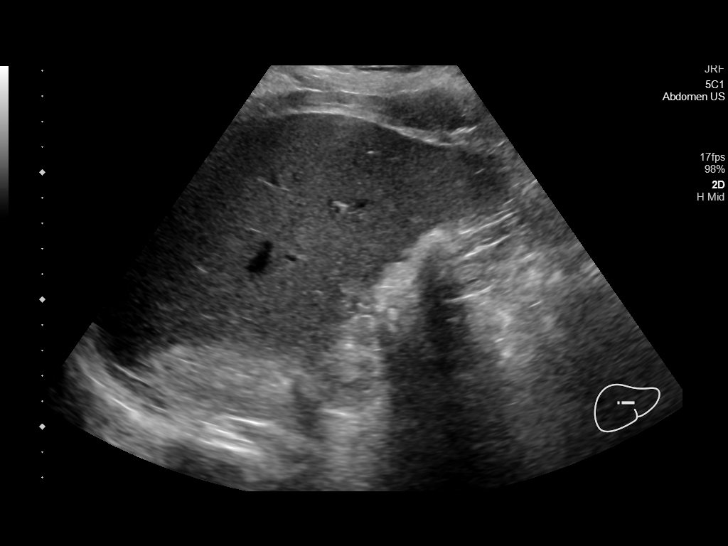
[im 45/45]
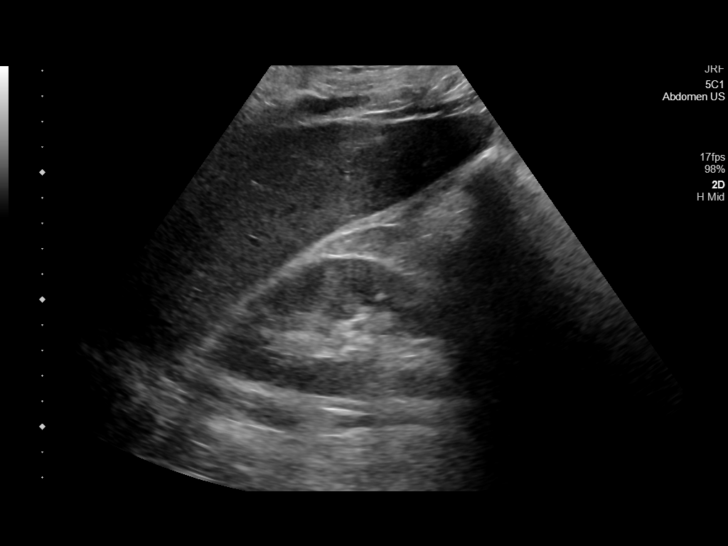

[14 of 25 positions shown; findings below may reference images not displayed]

FINDINGS: Gallbladder:

No gallstones or wall thickening visualized (2.0 mm). No sonographic
Murphy sign noted by sonographer.

Common bile duct:

Diameter: 2.4 mm

Liver:

A 1.0 cm x 0.8 cm x 1.0 cm anechoic structure is seen within the
right lobe of the liver. No flow is seen within this region on color
Doppler evaluation. Within normal limits in parenchymal
echogenicity. Portal vein is patent on color Doppler imaging with
normal direction of blood flow towards the liver.

Other: None.
IMPRESSION: Small hepatic cyst within the right lobe of the liver.

## 2020-05-25 ENCOUNTER — Other Ambulatory Visit: Payer: Self-pay

## 2020-05-25 ENCOUNTER — Encounter: Payer: Self-pay | Admitting: Family Medicine

## 2020-05-25 ENCOUNTER — Ambulatory Visit: Payer: BC Managed Care – PPO | Admitting: Family Medicine

## 2020-05-25 VITALS — BP 152/104 | HR 78 | Wt 233.6 lb

## 2020-05-25 DIAGNOSIS — I10 Essential (primary) hypertension: Secondary | ICD-10-CM

## 2020-05-25 DIAGNOSIS — I471 Supraventricular tachycardia: Secondary | ICD-10-CM

## 2020-05-25 MED ORDER — BENAZEPRIL-HYDROCHLOROTHIAZIDE 10-12.5 MG PO TABS: 1.0000 | ORAL_TABLET | Freq: Every day | ORAL | 1 refills | Status: DC

## 2020-05-25 NOTE — Progress Notes (Signed)
Established patient visit   Patient: Devin Parsons   DOB: 1970/03/19   50 y.o. Male  MRN: [id number] Visit Date: 05/25/2020  Today's healthcare provider: Lelon Huh, MD   Chief Complaint  Patient presents with  . Hypertension   I,Porsha C McClurkin,acting as a scribe for Lelon Huh, MD.,have documented all relevant documentation on the behalf of Lelon Huh, MD,as directed by  Lelon Huh, MD while in the presence of Lelon Huh, MD.   Subjective    HPI  Hypertension, follow-up  BP Readings from Last 3 Encounters:  05/25/20 (!) 168/119  11/04/19 (!) 162/102  08/18/19 (!) 174/110   Wt Readings from Last 3 Encounters:  05/25/20 233 lb 9.6 oz (106 kg)  11/04/19 242 lb (109.8 kg)  08/18/19 (!) 240 lb (108.9 kg)     He was last seen for hypertension 6 months ago.  BP at that visit was 162/102. Management since that visit includes: No improvement of BP in office or home readings with increased dose of verapamil. Anticipate increasing dose again if normal thyroid functions. Consider increasing ACEI or change to ARB.  He reports poor compliance with treatment. Patient reports he stopped taking bp medication a few months ago because they didn't seem to be having any affect on his blood pressure. He hasn't any trouble with palpitations since stopping medications.  He is not having side effects.  He is following a Regular diet. He is not exercising. He does smoke.  Use of agents associated with hypertension: none.   Outside blood pressures are not being checked. Symptoms: No chest pain No chest pressure  No palpitations No syncope  No dyspnea No orthopnea  No paroxysmal nocturnal dyspnea No lower extremity edema   Pertinent labs: Lab Results  Component Value Date   CHOL 175 08/18/2019   HDL 34 (L) 08/18/2019   LDLCALC 119 (H) 08/18/2019   TRIG 120 08/18/2019   CHOLHDL 5.1 (H) 08/18/2019   Lab Results  Component Value Date   NA 142 08/18/2019   K 3.9  08/18/2019   CREATININE 0.89 08/18/2019   GFRNONAA 99 08/18/2019   GFRAA 114 08/18/2019   GLUCOSE 86 08/18/2019     The 10-year ASCVD risk score Mikey Bussing DC Jr., et al., 2013) is: 19.3%   ---------------------------------------------------------------------------------------------------      Medications: Outpatient Medications Prior to Visit  Medication Sig  . famotidine (PEPCID) 20 MG tablet Take 1 tablet (20 mg total) by mouth 2 (two) times daily.  Marland Kitchen HYDROcodone-acetaminophen (NORCO/VICODIN) 5-325 MG tablet Take 1 tablet by mouth every 6 (six) hours as needed for moderate pain.  . hyoscyamine (LEVSIN SL) 0.125 MG SL tablet DISSOLVE 1 TABLET(0.125 MG) UNDER THE TONGUE EVERY 4 HOURS AS NEEDED  . ibuprofen (ADVIL,MOTRIN) 200 MG tablet Take 400-600 mg by mouth 2 (two) times daily as needed for headache or mild pain.   . meloxicam (MOBIC) 15 MG tablet TAKE 1 TABLET BY MOUTH EVERY DAY AS NEEDED FOR PAIN, REPLACES NABUMETONE  . buPROPion (WELLBUTRIN SR) 150 MG 12 hr tablet TAKE 1 TABLET BY MOUTH TWICE DAILY (Patient not taking: Reported on 05/25/2020)  . PARoxetine (PAXIL) 20 MG tablet Take 1 tablet (20 mg total) by mouth daily. (Patient not taking: Reported on 05/25/2020)  . valsartan-hydrochlorothiazide (DIOVAN HCT) 160-25 MG tablet Take 1 tablet by mouth daily. Take in place of losartan-hctz (Patient not taking: Reported on 05/25/2020)  . verapamil (CALAN-SR) 240 MG CR tablet Take 1 tablet (240 mg total) by mouth  2 (two) times daily. Please note change in quantity from 180 to #60 (Patient not taking: Reported on 05/25/2020)   No facility-administered medications prior to visit.    Review of Systems  Respiratory: Negative for chest tightness, shortness of breath and wheezing.   Cardiovascular: Negative for chest pain, palpitations and leg swelling.      Objective    BP (!) 152/104   Pulse 78   Wt 233 lb 9.6 oz (106 kg)   SpO2 99%   BMI 32.58 kg/m     Physical Exam    General:  Appearance:    Mildly obese male in no acute distress  Eyes:    PERRL, conjunctiva/corneas clear, EOM's intact       Lungs:     Clear to auscultation bilaterally, respirations unlabored  Heart:    Normal heart rate. Normal rhythm. No murmurs, rubs, or gallops.   MS:   All extremities are intact.   Neurologic:   Awake, alert, oriented x 3. No apparent focal neurological           defect.        Assessment & Plan     1. Primary hypertension Off Diovan-hctz and verapamil since they didn't seem to be having any effect on home BP readings. Will start benazepril-hydrochlorthiazide (LOTENSIN HCT) 10-12.5 MG tablet; Take 1 tablet by mouth daily.  Dispense: 30 tablet; Refill: 1   Follow up 4 weeks.   2. SVT (supraventricular tachycardia) (HCC) Symptomatically resolved, now off of verapamil. Consider adding diltiazem if more BP control needed of if he starts having any palpations.       The entirety of the information documented in the History of Present Illness, Review of Systems and Physical Exam were personally obtained by me. Portions of this information were initially documented by the CMA and reviewed by me for thoroughness and accuracy.      Lelon Huh, MD  [facility] [phone] (phone) [phone] (fax)  [place]

## 2020-05-28 ENCOUNTER — Other Ambulatory Visit: Payer: Self-pay | Admitting: Family Medicine

## 2020-05-28 DIAGNOSIS — G8929 Other chronic pain: Secondary | ICD-10-CM

## 2020-05-28 DIAGNOSIS — M545 Low back pain, unspecified: Secondary | ICD-10-CM

## 2020-05-28 DIAGNOSIS — M5020 Other cervical disc displacement, unspecified cervical region: Secondary | ICD-10-CM

## 2020-05-28 MED ORDER — HYDROCODONE-ACETAMINOPHEN 5-325 MG PO TABS: 1.0000 | ORAL_TABLET | Freq: Four times a day (QID) | ORAL | 0 refills | Status: DC | PRN

## 2020-06-14 ENCOUNTER — Other Ambulatory Visit: Payer: Self-pay | Admitting: Family Medicine

## 2020-06-14 DIAGNOSIS — M5020 Other cervical disc displacement, unspecified cervical region: Secondary | ICD-10-CM

## 2020-06-14 DIAGNOSIS — G8929 Other chronic pain: Secondary | ICD-10-CM

## 2020-06-14 DIAGNOSIS — M545 Low back pain, unspecified: Secondary | ICD-10-CM

## 2020-06-15 NOTE — Telephone Encounter (Signed)
Patient has made an additional call regarding the status of this prescription   The patient would like to be contacted by a member of staff to discuss further when possible   Please contact to advise further when available

## 2020-06-15 NOTE — Telephone Encounter (Signed)
Pt called asking if his refill request had been processed yet.  I told him no but possibly today.  CB#  [phone]

## 2020-06-16 MED ORDER — HYDROCODONE-ACETAMINOPHEN 5-325 MG PO TABS
1.0000 | ORAL_TABLET | Freq: Four times a day (QID) | ORAL | 0 refills | Status: DC | PRN
Start: 2020-06-16 — End: 2020-06-30

## 2020-06-29 NOTE — Progress Notes (Signed)
Established patient visit   Patient: Devin Parsons   DOB: [birth year]   50 y.o. Male  MRN: [id number] Visit Date: 06/30/2020  Today's healthcare provider: Lelon Huh, MD   Chief Complaint  Patient presents with  . Hypertension   Subjective    HPI  Hypertension, follow-up  BP Readings from Last 3 Encounters:  06/30/20 (!) 146/102  05/25/20 (!) 152/104  11/04/19 (!) 162/102   Wt Readings from Last 3 Encounters:  06/30/20 226 lb (102.5 kg)  05/25/20 233 lb 9.6 oz (106 kg)  11/04/19 242 lb (109.8 kg)     He was last seen for hypertension 1 months ago.  BP at that visit was 152/104. Management since that visit includes starting benazepril-hydrochlorthiazide (LOTENSIN HCT) 10-12.5 MG tablet.  He reports good compliance with treatment. He is not having side effects.  He is following a Regular diet. He is not exercising. He does smoke.  Use of agents associated with hypertension: none.   Outside blood pressures are not checked. Symptoms: No chest pain No chest pressure  No palpitations No syncope  No dyspnea No orthopnea  No paroxysmal nocturnal dyspnea No lower extremity edema   Pertinent labs: Lab Results  Component Value Date   CHOL 175 08/18/2019   HDL 34 (L) 08/18/2019   LDLCALC 119 (H) 08/18/2019   TRIG 120 08/18/2019   CHOLHDL 5.1 (H) 08/18/2019   Lab Results  Component Value Date   NA 142 08/18/2019   K 3.9 08/18/2019   CREATININE 0.89 08/18/2019   GFRNONAA 99 08/18/2019   GFRAA 114 08/18/2019   GLUCOSE 86 08/18/2019     The 10-year ASCVD risk score Mikey Bussing DC Jr., et al., 2013) is: 15.3%        Medications: Outpatient Medications Prior to Visit  Medication Sig  . benazepril-hydrochlorthiazide (LOTENSIN HCT) 10-12.5 MG tablet Take 1 tablet by mouth daily.  . famotidine (PEPCID) 20 MG tablet Take 1 tablet (20 mg total) by mouth 2 (two) times daily.  Marland Kitchen HYDROcodone-acetaminophen (NORCO/VICODIN) 5-325 MG tablet Take 1 tablet by mouth every  6 (six) hours as needed for moderate pain.  . hyoscyamine (LEVSIN SL) 0.125 MG SL tablet DISSOLVE 1 TABLET(0.125 MG) UNDER THE TONGUE EVERY 4 HOURS AS NEEDED  . ibuprofen (ADVIL,MOTRIN) 200 MG tablet Take 400-600 mg by mouth 2 (two) times daily as needed for headache or mild pain.   . meloxicam (MOBIC) 15 MG tablet TAKE 1 TABLET BY MOUTH EVERY DAY AS NEEDED FOR PAIN, REPLACES NABUMETONE   No facility-administered medications prior to visit.    Review of Systems  Constitutional: Negative for appetite change, chills and fever.  Respiratory: Negative for chest tightness, shortness of breath and wheezing.   Cardiovascular: Negative for chest pain and palpitations.  Gastrointestinal: Negative for abdominal pain, nausea and vomiting.       Objective    BP (!) 132/100   Pulse 69   Temp 98.2 F (36.8 C) (Temporal)   Resp 18   Wt 226 lb (102.5 kg)   BMI 31.52 kg/m    Today's Vitals   06/30/20 0808 06/30/20 0810 06/30/20 0827  BP: (!) 143/108 (!) 146/102 (!) 132/100  Pulse: 69    Resp: 18    Temp: 98.2 F (36.8 C)    TempSrc: Temporal    Weight: 226 lb (102.5 kg)     Body mass index is 31.52 kg/m.   Physical Exam   General appearance: Mildly obese male, cooperative and in no  acute distress Head: Normocephalic, without obvious abnormality, atraumatic Respiratory: Respirations even and unlabored, normal respiratory rate Extremities: All extremities are intact.  Skin: Skin color, texture, turgor normal. No rashes seen  Psych: Appropriate mood and affect. Neurologic: Mental status: Alert, oriented to person, place, and time, thought content appropriate.   Assessment & Plan     1. Primary hypertension Tolerating initiation of benazepril-hctz well. Systolic much better but diastolic not to goal - Renal function panel  If normal will increase to 20-12.5 with next refill at the end of this month, and schedule follow up in September.   2. Displacement of cervical  intervertebral disc without myelopathy refill - HYDROcodone-acetaminophen (NORCO/VICODIN) 5-325 MG tablet; Take 1 tablet by mouth every 6 (six) hours as needed for moderate pain.  Dispense: 60 tablet; Refill: 0  3. Chronic low back pain without sciatica, unspecified back pain laterality refill - HYDROcodone-acetaminophen (NORCO/VICODIN) 5-325 MG tablet; Take 1 tablet by mouth every 6 (six) hours as needed for moderate pain.  Dispense: 60 tablet; Refill: 0  4. Prostate cancer screening  - PSA        The entirety of the information documented in the History of Present Illness, Review of Systems and Physical Exam were personally obtained by me. Portions of this information were initially documented by the CMA and reviewed by me for thoroughness and accuracy.      Lelon Huh, MD  [facility] [phone] (phone) [phone] (fax)  [place]

## 2020-06-30 ENCOUNTER — Other Ambulatory Visit: Payer: Self-pay | Admitting: Family Medicine

## 2020-06-30 ENCOUNTER — Ambulatory Visit: Payer: BC Managed Care – PPO | Admitting: Family Medicine

## 2020-06-30 ENCOUNTER — Encounter: Payer: Self-pay | Admitting: Family Medicine

## 2020-06-30 ENCOUNTER — Other Ambulatory Visit: Payer: Self-pay

## 2020-06-30 VITALS — BP 132/100 | HR 69 | Temp 98.2°F | Resp 18 | Wt 226.0 lb

## 2020-06-30 DIAGNOSIS — Z125 Encounter for screening for malignant neoplasm of prostate: Secondary | ICD-10-CM | POA: Diagnosis not present

## 2020-06-30 DIAGNOSIS — I10 Essential (primary) hypertension: Secondary | ICD-10-CM

## 2020-06-30 DIAGNOSIS — M545 Low back pain, unspecified: Secondary | ICD-10-CM | POA: Diagnosis not present

## 2020-06-30 DIAGNOSIS — M5020 Other cervical disc displacement, unspecified cervical region: Secondary | ICD-10-CM | POA: Diagnosis not present

## 2020-06-30 DIAGNOSIS — G8929 Other chronic pain: Secondary | ICD-10-CM

## 2020-06-30 MED ORDER — HYDROCODONE-ACETAMINOPHEN 5-325 MG PO TABS: 1.0000 | ORAL_TABLET | Freq: Four times a day (QID) | ORAL | 0 refills | Status: DC | PRN

## 2020-06-30 NOTE — Patient Instructions (Signed)
.   If your labs are normal, I'll send in a prescription for benazepril-hctz 20-12.5 before you are due to have it refilled at the end of the month

## 2020-07-01 LAB — RENAL FUNCTION PANEL
Albumin: 4.7 g/dL (ref 3.8–4.9)
BUN/Creatinine Ratio: 11 (ref 9–20)
BUN: 10 mg/dL (ref 6–24)
CO2: 24 mmol/L (ref 20–29)
Calcium: 10.2 mg/dL (ref 8.7–10.2)
Chloride: 99 mmol/L (ref 96–106)
Creatinine, Ser: 0.89 mg/dL (ref 0.76–1.27)
Glucose: 96 mg/dL (ref 65–99)
Phosphorus: 3.6 mg/dL (ref 2.8–4.1)
Potassium: 4.3 mmol/L (ref 3.5–5.2)
Sodium: 145 mmol/L — ABNORMAL HIGH (ref 134–144)
eGFR: 103 mL/min/{1.73_m2} (ref >59)

## 2020-07-01 LAB — PSA: Prostate Specific Ag, Serum: 1.4 ng/mL (ref 0.0–4.0)

## 2020-07-11 ENCOUNTER — Other Ambulatory Visit: Payer: Self-pay | Admitting: Family Medicine

## 2020-07-11 DIAGNOSIS — M545 Low back pain, unspecified: Secondary | ICD-10-CM

## 2020-07-11 DIAGNOSIS — G8929 Other chronic pain: Secondary | ICD-10-CM

## 2020-07-11 DIAGNOSIS — M5020 Other cervical disc displacement, unspecified cervical region: Secondary | ICD-10-CM

## 2020-07-11 MED ORDER — HYDROCODONE-ACETAMINOPHEN 5-325 MG PO TABS: 1.0000 | ORAL_TABLET | Freq: Four times a day (QID) | ORAL | 0 refills | Status: DC | PRN

## 2020-07-15 ENCOUNTER — Other Ambulatory Visit: Payer: Self-pay | Admitting: Family Medicine

## 2020-07-15 MED ORDER — BENAZEPRIL-HYDROCHLOROTHIAZIDE 20-12.5 MG PO TABS: 1.0000 | ORAL_TABLET | Freq: Every day | ORAL | 3 refills | Status: DC

## 2020-07-15 NOTE — Progress Notes (Signed)
Increase benazepril-hctz as per last office visit.

## 2020-08-09 ENCOUNTER — Other Ambulatory Visit: Payer: Self-pay | Admitting: Family Medicine

## 2020-08-09 DIAGNOSIS — G8929 Other chronic pain: Secondary | ICD-10-CM

## 2020-08-09 DIAGNOSIS — M545 Low back pain, unspecified: Secondary | ICD-10-CM

## 2020-08-09 DIAGNOSIS — D044 Carcinoma in situ of skin of scalp and neck: Secondary | ICD-10-CM | POA: Diagnosis not present

## 2020-08-09 DIAGNOSIS — L570 Actinic keratosis: Secondary | ICD-10-CM | POA: Diagnosis not present

## 2020-08-09 DIAGNOSIS — M5020 Other cervical disc displacement, unspecified cervical region: Secondary | ICD-10-CM

## 2020-08-09 DIAGNOSIS — Z85828 Personal history of other malignant neoplasm of skin: Secondary | ICD-10-CM | POA: Diagnosis not present

## 2020-08-09 DIAGNOSIS — L578 Other skin changes due to chronic exposure to nonionizing radiation: Secondary | ICD-10-CM | POA: Diagnosis not present

## 2020-08-09 MED ORDER — HYDROCODONE-ACETAMINOPHEN 5-325 MG PO TABS: 1.0000 | ORAL_TABLET | Freq: Four times a day (QID) | ORAL | 0 refills | Status: DC | PRN

## 2020-09-07 ENCOUNTER — Other Ambulatory Visit: Payer: Self-pay | Admitting: Family Medicine

## 2020-09-07 DIAGNOSIS — G8929 Other chronic pain: Secondary | ICD-10-CM

## 2020-09-07 DIAGNOSIS — M5020 Other cervical disc displacement, unspecified cervical region: Secondary | ICD-10-CM

## 2020-09-07 DIAGNOSIS — M545 Low back pain, unspecified: Secondary | ICD-10-CM

## 2020-09-07 MED ORDER — HYDROCODONE-ACETAMINOPHEN 5-325 MG PO TABS: 1.0000 | ORAL_TABLET | Freq: Four times a day (QID) | ORAL | 0 refills | Status: DC | PRN

## 2020-10-06 ENCOUNTER — Other Ambulatory Visit: Payer: Self-pay | Admitting: Family Medicine

## 2020-10-06 DIAGNOSIS — M5020 Other cervical disc displacement, unspecified cervical region: Secondary | ICD-10-CM

## 2020-10-06 DIAGNOSIS — G8929 Other chronic pain: Secondary | ICD-10-CM

## 2020-10-06 MED ORDER — HYDROCODONE-ACETAMINOPHEN 5-325 MG PO TABS: 1.0000 | ORAL_TABLET | Freq: Four times a day (QID) | ORAL | 0 refills | Status: DC | PRN

## 2020-10-08 DIAGNOSIS — H5203 Hypermetropia, bilateral: Secondary | ICD-10-CM | POA: Diagnosis not present

## 2020-11-06 ENCOUNTER — Other Ambulatory Visit: Payer: Self-pay | Admitting: Family Medicine

## 2020-11-06 DIAGNOSIS — M545 Low back pain, unspecified: Secondary | ICD-10-CM

## 2020-11-06 DIAGNOSIS — G8929 Other chronic pain: Secondary | ICD-10-CM

## 2020-11-06 DIAGNOSIS — M5020 Other cervical disc displacement, unspecified cervical region: Secondary | ICD-10-CM

## 2020-11-07 ENCOUNTER — Other Ambulatory Visit: Payer: Self-pay | Admitting: Family Medicine

## 2020-11-07 DIAGNOSIS — M5020 Other cervical disc displacement, unspecified cervical region: Secondary | ICD-10-CM

## 2020-11-07 NOTE — Telephone Encounter (Signed)
Requested Prescriptions  Pending Prescriptions Disp Refills  . benazepril-hydrochlorthiazide (LOTENSIN HCT) 20-12.5 MG tablet [Pharmacy Med Name: BENAZEPRIL/HCTZ 20/12.5MG  TABLETS] 30 tablet 1    Sig: TAKE 1 TABLET BY MOUTH DAILY     Cardiovascular:  ACEI + Diuretic Combos Failed - 11/07/2020 12:09 PM      Failed - Na in normal range and within 180 days    Sodium  Date Value Ref Range Status  06/30/2020 145 (H) 134 - 144 mmol/L Final         Failed - Last BP in normal range    BP Readings from Last 1 Encounters:  06/30/20 (!) 132/100         Passed - K in normal range and within 180 days    Potassium  Date Value Ref Range Status  06/30/2020 4.3 3.5 - 5.2 mmol/L Final         Passed - Cr in normal range and within 180 days    Creatinine, Ser  Date Value Ref Range Status  06/30/2020 0.89 0.76 - 1.27 mg/dL Final         Passed - Ca in normal range and within 180 days    Calcium  Date Value Ref Range Status  06/30/2020 10.2 8.7 - 10.2 mg/dL Final         Passed - Patient is not pregnant      Passed - Valid encounter within last 6 months    Recent Outpatient Visits          4 months ago Primary hypertension   [place], Donald E, MD   5 months ago Primary hypertension   [facility] Birdie Sons, MD   1 year ago SVT (supraventricular tachycardia) [facility])   [facility] Birdie Sons, MD   1 year ago Displacement of cervical intervertebral disc without myelopathy   [facility] Birdie Sons, MD   1 year ago Essential hypertension   [place], Kirstie Peri, MD             . meloxicam ([place]) 15 MG tablet [Pharmacy Med Name: MELOXICAM 15MG  TABLETS] 30 tablet 5    Sig: TAKE 1 TABLET BY MOUTH EVERY DAY AS NEEDED FOR PAIN, REPLACES NABUMETONE     Analgesics:  COX2 Inhibitors Failed - 11/07/2020 12:09 PM      Failed - HGB in normal range and within 360 days    Hemoglobin   Date Value Ref Range Status  04/16/2019 15.9 13.0 - 17.0 g/dL Final  08/31/2015 14.2 12.6 - 17.7 g/dL Final         Passed - Cr in normal range and within 360 days    Creatinine, Ser  Date Value Ref Range Status  06/30/2020 0.89 0.76 - 1.27 mg/dL Final         Passed - Patient is not pregnant      Passed - Valid encounter within last 12 months    Recent Outpatient Visits          4 months ago Primary hypertension   [facility] Birdie Sons, MD   5 months ago Primary hypertension   [facility] Birdie Sons, MD   1 year ago SVT (supraventricular tachycardia) [facility])   [facility] Birdie Sons, MD   1 year ago Displacement of cervical intervertebral disc without myelopathy   [facility] Birdie Sons, MD   1 year ago Essential  hypertension   [facility] Caryn Section, Kirstie Peri, MD

## 2020-11-08 NOTE — Telephone Encounter (Signed)
LOV: 06/30/2020 NOV: None   Last Refill:  10/06/2020 #120 0 Refills.

## 2020-11-09 MED ORDER — HYDROCODONE-ACETAMINOPHEN 5-325 MG PO TABS: 1.0000 | ORAL_TABLET | Freq: Four times a day (QID) | ORAL | 0 refills | Status: DC | PRN

## 2020-11-09 NOTE — Telephone Encounter (Signed)
Left message to schedule follow up visit 11/09/2020.  PEC please schedule pt when he calls back.     Thanks,   -Mickel Baas

## 2020-11-09 NOTE — Telephone Encounter (Signed)
Patient is overdue for follow up office visit for hypertension. Needs to be scheduled before we can approve prescription refills.

## 2020-11-12 ENCOUNTER — Ambulatory Visit: Payer: BC Managed Care – PPO | Admitting: Family Medicine

## 2020-11-12 ENCOUNTER — Encounter: Payer: Self-pay | Admitting: Family Medicine

## 2020-11-12 ENCOUNTER — Other Ambulatory Visit: Payer: Self-pay

## 2020-11-12 VITALS — BP 154/101 | HR 89 | Temp 98.5°F | Resp 16 | Ht 71.0 in | Wt 224.0 lb

## 2020-11-12 DIAGNOSIS — I10 Essential (primary) hypertension: Secondary | ICD-10-CM | POA: Diagnosis not present

## 2020-11-12 MED ORDER — AMLODIPINE BESYLATE 2.5 MG PO TABS: 5.0000 mg | ORAL_TABLET | Freq: Every day | ORAL | 3 refills | Status: DC

## 2020-11-12 NOTE — Progress Notes (Signed)
I,April Miller,acting as a scribe for Lelon Huh, MD.,have documented all relevant documentation on the behalf of Lelon Huh, MD,as directed by  Lelon Huh, MD while in the presence of Lelon Huh, MD.   Established patient visit   Patient: Devin Parsons   DOB: 10-18-1970   50 y.o. Male  MRN: [id number] Visit Date: 11/12/2020  Today's healthcare provider: Lelon Huh, MD   Chief Complaint  Patient presents with   Follow-up   Hypertension   Subjective    HPI  Hypertension, follow-up  BP Readings from Last 3 Encounters:  06/30/20 (!) 132/100  05/25/20 (!) 152/104  11/04/19 (!) 162/102   Wt Readings from Last 3 Encounters:  06/30/20 226 lb (102.5 kg)  05/25/20 233 lb 9.6 oz (106 kg)  11/04/19 242 lb (109.8 kg)     He was last seen for hypertension 4 months ago.  BP at that visit was 132/100. Management since that visit includes; Tolerating initiation of benazepril-hctz well. Systolic much better but diastolic not to goal. He reports good compliance with treatment. He is not having side effects. none He is not exercising. Walks alot at work. He is adherent to low salt diet.   Outside blood pressures are not checking.  He does not smoke.  Use of agents associated with hypertension: none.   ---------------------------------------------------------------------------------------------------     Medications: Outpatient Medications Prior to Visit  Medication Sig   benazepril-hydrochlorthiazide (LOTENSIN HCT) 20-12.5 MG tablet TAKE 1 TABLET BY MOUTH DAILY   famotidine (PEPCID) 20 MG tablet Take 1 tablet (20 mg total) by mouth 2 (two) times daily.   HYDROcodone-acetaminophen (NORCO/VICODIN) 5-325 MG tablet Take 1 tablet by mouth every 6 (six) hours as needed for moderate pain.   hyoscyamine (LEVSIN SL) 0.125 MG SL tablet DISSOLVE 1 TABLET(0.125 MG) UNDER THE TONGUE EVERY 4 HOURS AS NEEDED   ibuprofen (ADVIL,MOTRIN) 200 MG tablet Take 400-600 mg by mouth  2 (two) times daily as needed for headache or mild pain.    meloxicam (MOBIC) 15 MG tablet TAKE 1 TABLET BY MOUTH EVERY DAY AS NEEDED FOR PAIN, REPLACES NABUMETONE   No facility-administered medications prior to visit.         Objective    BP (!) 154/101 (BP Location: Left Arm, Patient Position: Sitting, Cuff Size: Large)   Pulse 89   Temp 98.5 F (36.9 C) (Temporal)   Resp 16   Ht 5\' 11"  (1.803 m)   Wt 224 lb (101.6 kg)   SpO2 98%   BMI 31.24 kg/m  {Show previous vital signs (optional):23777}  Physical Exam   General: Appearance:    Mildly obese male in no acute distress  Eyes:    PERRL, conjunctiva/corneas clear, EOM's intact       Lungs:     Clear to auscultation bilaterally, respirations unlabored  Heart:    Normal heart rate. Normal rhythm. No murmurs, rubs, or gallops.    MS:   All extremities are intact.    Neurologic:   Awake, alert, oriented x 3. No apparent focal neurological defect.          Assessment & Plan     1. Primary hypertension Compliant with current treatment but home and office Bps not to goal  - amLODipine (NORVASC) 2.5 MG tablet;    Future Appointments  Date Time Provider [place]  03/11/2021  1:40 PM Caryn Section Kirstie Peri, MD [facility]        The entirety of the information documented in  the History of Present Illness, Review of Systems and Physical Exam were personally obtained by me. Portions of this information were initially documented by the CMA and reviewed by me for thoroughness and accuracy. Lelon Huh, MD  [facility] [phone] (phone) [phone] (fax)  [place]

## 2020-11-13 ENCOUNTER — Encounter: Payer: Self-pay | Admitting: Family Medicine

## 2020-11-13 MED ORDER — AMLODIPINE BESYLATE 2.5 MG PO TABS: 2.5000 mg | ORAL_TABLET | Freq: Every day | ORAL | 3 refills | Status: DC

## 2020-12-07 ENCOUNTER — Other Ambulatory Visit: Payer: Self-pay | Admitting: Family Medicine

## 2020-12-07 DIAGNOSIS — M5020 Other cervical disc displacement, unspecified cervical region: Secondary | ICD-10-CM

## 2020-12-07 DIAGNOSIS — R051 Acute cough: Secondary | ICD-10-CM | POA: Diagnosis not present

## 2020-12-07 DIAGNOSIS — Z03818 Encounter for observation for suspected exposure to other biological agents ruled out: Secondary | ICD-10-CM | POA: Diagnosis not present

## 2020-12-07 DIAGNOSIS — R059 Cough, unspecified: Secondary | ICD-10-CM | POA: Diagnosis not present

## 2020-12-07 DIAGNOSIS — J22 Unspecified acute lower respiratory infection: Secondary | ICD-10-CM | POA: Diagnosis not present

## 2020-12-07 DIAGNOSIS — G8929 Other chronic pain: Secondary | ICD-10-CM

## 2020-12-07 MED ORDER — HYDROCODONE-ACETAMINOPHEN 5-325 MG PO TABS: 1.0000 | ORAL_TABLET | Freq: Four times a day (QID) | ORAL | 0 refills | Status: DC | PRN

## 2021-01-04 ENCOUNTER — Other Ambulatory Visit: Payer: Self-pay | Admitting: Family Medicine

## 2021-01-04 DIAGNOSIS — G8929 Other chronic pain: Secondary | ICD-10-CM

## 2021-01-04 DIAGNOSIS — M5020 Other cervical disc displacement, unspecified cervical region: Secondary | ICD-10-CM

## 2021-01-04 DIAGNOSIS — M545 Low back pain, unspecified: Secondary | ICD-10-CM

## 2021-01-04 MED ORDER — HYDROCODONE-ACETAMINOPHEN 5-325 MG PO TABS: 1.0000 | ORAL_TABLET | Freq: Four times a day (QID) | ORAL | 0 refills | Status: DC | PRN

## 2021-01-04 NOTE — Telephone Encounter (Signed)
LOV: 11/12/2020  NOV: 03/11/2021  Last Refill:  12/07/2020 #120 0 Refills   Thanks,   -Mickel Baas

## 2021-02-03 ENCOUNTER — Other Ambulatory Visit: Payer: Self-pay | Admitting: Family Medicine

## 2021-02-03 DIAGNOSIS — M545 Low back pain, unspecified: Secondary | ICD-10-CM

## 2021-02-03 DIAGNOSIS — M5020 Other cervical disc displacement, unspecified cervical region: Secondary | ICD-10-CM

## 2021-02-03 DIAGNOSIS — G8929 Other chronic pain: Secondary | ICD-10-CM

## 2021-02-03 MED ORDER — HYDROCODONE-ACETAMINOPHEN 5-325 MG PO TABS: 1.0000 | ORAL_TABLET | Freq: Four times a day (QID) | ORAL | 0 refills | Status: DC | PRN

## 2021-03-04 ENCOUNTER — Other Ambulatory Visit: Payer: Self-pay | Admitting: Family Medicine

## 2021-03-04 DIAGNOSIS — M545 Low back pain, unspecified: Secondary | ICD-10-CM

## 2021-03-04 DIAGNOSIS — G8929 Other chronic pain: Secondary | ICD-10-CM

## 2021-03-04 DIAGNOSIS — M5020 Other cervical disc displacement, unspecified cervical region: Secondary | ICD-10-CM

## 2021-03-04 DIAGNOSIS — I10 Essential (primary) hypertension: Secondary | ICD-10-CM

## 2021-03-04 MED ORDER — HYDROCODONE-ACETAMINOPHEN 5-325 MG PO TABS: 1.0000 | ORAL_TABLET | Freq: Four times a day (QID) | ORAL | 0 refills | Status: DC | PRN

## 2021-03-11 ENCOUNTER — Other Ambulatory Visit: Payer: Self-pay

## 2021-03-11 ENCOUNTER — Encounter: Payer: Self-pay | Admitting: Family Medicine

## 2021-03-11 ENCOUNTER — Ambulatory Visit: Payer: BC Managed Care – PPO | Admitting: Family Medicine

## 2021-03-11 VITALS — BP 140/90 | HR 64 | Temp 97.9°F | Resp 14 | Wt 225.0 lb

## 2021-03-11 DIAGNOSIS — G8929 Other chronic pain: Secondary | ICD-10-CM

## 2021-03-11 DIAGNOSIS — M545 Low back pain, unspecified: Secondary | ICD-10-CM | POA: Diagnosis not present

## 2021-03-11 DIAGNOSIS — I10 Essential (primary) hypertension: Secondary | ICD-10-CM

## 2021-03-11 DIAGNOSIS — E782 Mixed hyperlipidemia: Secondary | ICD-10-CM

## 2021-03-11 DIAGNOSIS — F1190 Opioid use, unspecified, uncomplicated: Secondary | ICD-10-CM

## 2021-03-11 DIAGNOSIS — I471 Supraventricular tachycardia: Secondary | ICD-10-CM | POA: Diagnosis not present

## 2021-03-11 DIAGNOSIS — I7121 Aneurysm of the ascending aorta, without rupture: Secondary | ICD-10-CM

## 2021-03-11 DIAGNOSIS — M5020 Other cervical disc displacement, unspecified cervical region: Secondary | ICD-10-CM

## 2021-03-11 MED ORDER — BENAZEPRIL-HYDROCHLOROTHIAZIDE 20-12.5 MG PO TABS: 1.0000 | ORAL_TABLET | Freq: Every day | ORAL | 5 refills | Status: DC

## 2021-03-11 NOTE — Progress Notes (Signed)
I,Roshena L Chambers,acting as a scribe for Mila Merry, MD.,have documented all relevant documentation on the behalf of Mila Merry, MD,as directed by  Mila Merry, MD while in the presence of Mila Merry, MD.   Established patient visit   Patient: Devin Parsons   DOB: 1970-09-25   51 y.o. Male  MRN: [id number] Visit Date: 03/11/2021  Today's healthcare provider: Mila Merry, MD   Chief Complaint  Patient presents with   Hypertension   Pain Management   Subjective    HPI  Hypertension, follow-up  BP Readings from Last 3 Encounters:  03/11/21 140/90  11/12/20 (!) 154/101  06/30/20 (!) 132/100   Wt Readings from Last 3 Encounters:  03/11/21 225 lb (102.1 kg)  11/12/20 224 lb (101.6 kg)  06/30/20 226 lb (102.5 kg)     He was last seen for hypertension 4 months ago.  BP at that visit was 154/101. Management since that visit includes added amlodipine 2.5mg  daily.  He reports good compliance with treatment. He is not having side effects.  He is following a Regular diet. He is not exercising. He does smoke.  Use of agents associated with hypertension: none.   Outside blood pressures are not checked. Symptoms: No chest pain No chest pressure  No palpitations No syncope  No dyspnea No orthopnea  No paroxysmal nocturnal dyspnea No lower extremity edema   Pertinent labs: Lab Results  Component Value Date   CHOL 175 08/18/2019   HDL 34 (L) 08/18/2019   LDLCALC 119 (H) 08/18/2019   TRIG 120 08/18/2019   CHOLHDL 5.1 (H) 08/18/2019   Lab Results  Component Value Date   NA 145 (H) 06/30/2020   K 4.3 06/30/2020   CREATININE 0.89 06/30/2020   EGFR 103 06/30/2020   GLUCOSE 96 06/30/2020   TSH 1.140 11/04/2019     The 10-year ASCVD risk score (Arnett DK, et al., 2019) is: 15.1%   ---------------------------------------------------------------------------------------------------   Follow up for chronic back pain:  The patient was last seen for this  8 months ago. Changes made at last visit include none; continue same treatment.  He reports good compliance with treatment. He feels that condition is Unchanged. He is not having side effects.   -----------------------------------------------------------------------------------------   Medications: Outpatient Medications Prior to Visit  Medication Sig   amLODipine (NORVASC) 2.5 MG tablet TAKE 2 TABLETS(5 MG) BY MOUTH DAILY   benazepril-hydrochlorthiazide (LOTENSIN HCT) 20-12.5 MG tablet TAKE 1 TABLET BY MOUTH DAILY   famotidine (PEPCID) 20 MG tablet Take 1 tablet (20 mg total) by mouth 2 (two) times daily.   HYDROcodone-acetaminophen (NORCO/VICODIN) 5-325 MG tablet Take 1 tablet by mouth every 6 (six) hours as needed for moderate pain.   hyoscyamine (LEVSIN SL) 0.125 MG SL tablet DISSOLVE 1 TABLET(0.125 MG) UNDER THE TONGUE EVERY 4 HOURS AS NEEDED   ibuprofen (ADVIL,MOTRIN) 200 MG tablet Take 400-600 mg by mouth 2 (two) times daily as needed for headache or mild pain.    meloxicam (MOBIC) 15 MG tablet TAKE 1 TABLET BY MOUTH EVERY DAY AS NEEDED FOR PAIN, REPLACES NABUMETONE   No facility-administered medications prior to visit.    Review of Systems  Constitutional:  Negative for appetite change, chills and fever.  Respiratory:  Negative for chest tightness, shortness of breath and wheezing.   Cardiovascular:  Negative for chest pain and palpitations.  Gastrointestinal:  Negative for abdominal pain, nausea and vomiting.      Objective    BP 140/90 (BP Location: Right Arm,  Patient Position: Sitting, Cuff Size: Large)    Pulse 64    Temp 97.9 F (36.6 C) (Oral)    Resp 14    Wt 225 lb (102.1 kg)    SpO2 100% Comment: room air   BMI 31.38 kg/m    Today's Vitals   03/11/21 1343 03/11/21 1346  BP: (!) 143/96 140/90  Pulse: 64   Resp: 14   Temp: 97.9 F (36.6 C)   TempSrc: Oral   SpO2: 100%   Weight: 225 lb (102.1 kg)    Body mass index is 31.38 kg/m.   Physical Exam    General: Appearance:    Mildly obese male in no acute distress  Eyes:    PERRL, conjunctiva/corneas clear, EOM's intact       Lungs:     Clear to auscultation bilaterally, respirations unlabored  Heart:    Normal heart rate. Normal rhythm. No murmurs, rubs, or gallops.    MS:   All extremities are intact.    Neurologic:   Awake, alert, oriented x 3. No apparent focal neurological defect.          Assessment & Plan     1. Primary hypertension Well controlled.  .   2. SVT (supraventricular tachycardia) (HCC) Resolved.   3. Hyperlipidemia, mixed Diet controlled.   4. Displacement of cervical intervertebral disc without myelopathy  - Pain Mgt Scrn (14 Drugs), Ur  5. Chronic low back pain without sciatica, unspecified back pain laterality  - Pain Mgt Scrn (14 Drugs), Ur  6. Chronic, continuous use of opioids  - Pain Mgt Scrn (14 Drugs), Ur  Adequately controlled on current pain medications.   7. Aneurysm of ascending aorta without rupture  - CT Angio Chest W/Cm &/Or Wo Cm; Future       The entirety of the information documented in the History of Present Illness, Review of Systems and Physical Exam were personally obtained by me. Portions of this information were initially documented by the CMA and reviewed by me for thoroughness and accuracy.     Lelon Huh, MD  [facility] [phone] (phone) [phone] (fax)  [place]

## 2021-03-11 NOTE — Patient Instructions (Signed)
.   Please review the attached list of medications and notify my office if there are any errors.   . Please bring all of your medications to every appointment so we can make sure that our medication list is the same as yours.   

## 2021-03-13 LAB — PAIN MGT SCRN (14 DRUGS), UR
Amphetamine Scrn, Ur: NEGATIVE ng/mL
BARBITURATE SCREEN URINE: NEGATIVE ng/mL
BENZODIAZEPINE SCREEN, URINE: NEGATIVE ng/mL
Buprenorphine, Urine: NEGATIVE ng/mL
CANNABINOIDS UR QL SCN: POSITIVE ng/mL — AB
Cocaine (Metab) Scrn, Ur: NEGATIVE ng/mL
Creatinine(Crt), U: 87 mg/dL (ref 20.0–300.0)
Fentanyl, Urine: NEGATIVE pg/mL
Meperidine Screen, Urine: NEGATIVE ng/mL
Methadone Screen, Urine: NEGATIVE ng/mL
OXYCODONE+OXYMORPHONE UR QL SCN: NEGATIVE ng/mL
Opiate Scrn, Ur: POSITIVE ng/mL — AB
Ph of Urine: 6.7 (ref 4.5–8.9)
Phencyclidine Qn, Ur: NEGATIVE ng/mL
Propoxyphene Scrn, Ur: NEGATIVE ng/mL
Tramadol Screen, Urine: NEGATIVE ng/mL

## 2021-03-15 ENCOUNTER — Telehealth: Payer: Self-pay

## 2021-03-15 DIAGNOSIS — I7121 Aneurysm of the ascending aorta, without rupture: Secondary | ICD-10-CM

## 2021-03-15 NOTE — Telephone Encounter (Signed)
Copied from [place] [id number]. Topic: General - Other >> Mar 15, 2021 11:08 AM Parke Poisson wrote: Reason for CRM: Per [facility] unless you are looking to rule out a PE the CT will need to be changed to CT angio of chest without the PE,Thanks

## 2021-03-15 NOTE — Addendum Note (Signed)
Addended by: Birdie Sons on: 03/15/2021 03:10 PM   Modules accepted: Orders

## 2021-03-28 ENCOUNTER — Ambulatory Visit: Payer: BC Managed Care – PPO

## 2021-04-05 ENCOUNTER — Other Ambulatory Visit: Payer: Self-pay | Admitting: Family Medicine

## 2021-04-05 DIAGNOSIS — M545 Low back pain, unspecified: Secondary | ICD-10-CM

## 2021-04-05 DIAGNOSIS — M5020 Other cervical disc displacement, unspecified cervical region: Secondary | ICD-10-CM

## 2021-04-05 MED ORDER — HYDROCODONE-ACETAMINOPHEN 5-325 MG PO TABS: 1.0000 | ORAL_TABLET | Freq: Four times a day (QID) | ORAL | 0 refills | Status: DC | PRN

## 2021-05-05 ENCOUNTER — Other Ambulatory Visit: Payer: Self-pay | Admitting: Family Medicine

## 2021-05-05 DIAGNOSIS — M5020 Other cervical disc displacement, unspecified cervical region: Secondary | ICD-10-CM

## 2021-05-05 DIAGNOSIS — G8929 Other chronic pain: Secondary | ICD-10-CM

## 2021-05-05 MED ORDER — HYDROCODONE-ACETAMINOPHEN 5-325 MG PO TABS: 1.0000 | ORAL_TABLET | Freq: Four times a day (QID) | ORAL | 0 refills | Status: DC | PRN

## 2021-06-06 ENCOUNTER — Other Ambulatory Visit: Payer: Self-pay | Admitting: Family Medicine

## 2021-06-06 DIAGNOSIS — M5020 Other cervical disc displacement, unspecified cervical region: Secondary | ICD-10-CM

## 2021-06-06 DIAGNOSIS — G8929 Other chronic pain: Secondary | ICD-10-CM

## 2021-06-06 MED ORDER — HYDROCODONE-ACETAMINOPHEN 5-325 MG PO TABS: 1.0000 | ORAL_TABLET | Freq: Four times a day (QID) | ORAL | 0 refills | Status: DC | PRN

## 2021-06-09 DIAGNOSIS — K080 Exfoliation of teeth due to systemic causes: Secondary | ICD-10-CM | POA: Diagnosis not present

## 2021-07-04 ENCOUNTER — Other Ambulatory Visit: Payer: Self-pay | Admitting: Family Medicine

## 2021-07-04 DIAGNOSIS — G8929 Other chronic pain: Secondary | ICD-10-CM

## 2021-07-04 DIAGNOSIS — M5020 Other cervical disc displacement, unspecified cervical region: Secondary | ICD-10-CM

## 2021-07-05 MED ORDER — HYDROCODONE-ACETAMINOPHEN 5-325 MG PO TABS: 1.0000 | ORAL_TABLET | Freq: Four times a day (QID) | ORAL | 0 refills | Status: DC | PRN

## 2021-07-11 ENCOUNTER — Ambulatory Visit: Payer: BC Managed Care – PPO | Admitting: Family Medicine

## 2021-08-03 ENCOUNTER — Other Ambulatory Visit: Payer: Self-pay | Admitting: Family Medicine

## 2021-08-03 DIAGNOSIS — M545 Low back pain, unspecified: Secondary | ICD-10-CM

## 2021-08-03 DIAGNOSIS — M5020 Other cervical disc displacement, unspecified cervical region: Secondary | ICD-10-CM

## 2021-08-03 DIAGNOSIS — I10 Essential (primary) hypertension: Secondary | ICD-10-CM

## 2021-08-03 MED ORDER — HYDROCODONE-ACETAMINOPHEN 5-325 MG PO TABS: 1.0000 | ORAL_TABLET | Freq: Four times a day (QID) | ORAL | 0 refills | Status: DC | PRN

## 2021-08-03 NOTE — Telephone Encounter (Signed)
Last refill: 07/05/2021 #120 with 0 refills  Last office visit: 03/11/2021 Next office visit: 08/08/2021

## 2021-08-08 ENCOUNTER — Ambulatory Visit: Payer: BC Managed Care – PPO | Admitting: Family Medicine

## 2021-08-11 ENCOUNTER — Other Ambulatory Visit: Payer: Self-pay | Admitting: Family Medicine

## 2021-08-11 DIAGNOSIS — G8929 Other chronic pain: Secondary | ICD-10-CM

## 2021-08-11 DIAGNOSIS — M5020 Other cervical disc displacement, unspecified cervical region: Secondary | ICD-10-CM

## 2021-08-11 MED ORDER — HYDROCODONE-ACETAMINOPHEN 5-325 MG PO TABS: 1.0000 | ORAL_TABLET | Freq: Four times a day (QID) | ORAL | 0 refills | Status: DC | PRN

## 2021-08-11 NOTE — Telephone Encounter (Signed)
Rf for 120 tabs [facility], but only 28 were dispensed

## 2021-08-26 ENCOUNTER — Ambulatory Visit: Payer: BC Managed Care – PPO | Admitting: Family Medicine

## 2021-08-26 ENCOUNTER — Encounter: Payer: Self-pay | Admitting: Family Medicine

## 2021-08-26 VITALS — BP 150/88 | HR 68 | Ht 71.0 in | Wt 221.0 lb

## 2021-08-26 DIAGNOSIS — I7121 Aneurysm of the ascending aorta, without rupture: Secondary | ICD-10-CM | POA: Diagnosis not present

## 2021-08-26 DIAGNOSIS — E782 Mixed hyperlipidemia: Secondary | ICD-10-CM | POA: Diagnosis not present

## 2021-08-26 DIAGNOSIS — I471 Supraventricular tachycardia, unspecified: Secondary | ICD-10-CM

## 2021-08-26 DIAGNOSIS — Z125 Encounter for screening for malignant neoplasm of prostate: Secondary | ICD-10-CM

## 2021-08-26 DIAGNOSIS — I10 Essential (primary) hypertension: Secondary | ICD-10-CM | POA: Diagnosis not present

## 2021-08-26 NOTE — Progress Notes (Signed)
Established patient visit   Patient: Devin Parsons   DOB: [birth year]   51 y.o. Male  MRN: [id number] Visit Date: 08/26/2021  Today's healthcare provider: Lelon Huh, MD   He is here for a 3-4 month follow up on his hypertension.   Subjective    HPI  Hypertension, follow-up  BP Readings from Last 3 Encounters:  03/11/21 140/90  11/12/20 (!) 154/101  06/30/20 (!) 132/100   Wt Readings from Last 3 Encounters:  03/11/21 225 lb (102.1 kg)  11/12/20 224 lb (101.6 kg)  06/30/20 226 lb (102.5 kg)     He was last seen for hypertension 6 months ago.  Management since that visit includes; Well controlled. He reports good compliance with treatment.  Outside blood pressures are not being checked. [place] Kitchen  He does not smoke. --------------------------------------------------------------------------------------------------- Lipid/Cholesterol, follow-up  Last Lipid Panel: Lab Results  Component Value Date   CHOL 175 08/18/2019   LDLCALC 119 (H) 08/18/2019   HDL 34 (L) 08/18/2019   TRIG 120 08/18/2019    He was last seen for this 2 years ago.  Management since that visit includes; not currently taking a medication.  He reports good compliance with treatment. He is not having side effects.     Last metabolic panel Lab Results  Component Value Date   GLUCOSE 96 06/30/2020   NA 145 (H) 06/30/2020   K 4.3 06/30/2020   BUN 10 06/30/2020   CREATININE 0.89 06/30/2020   EGFR 103 06/30/2020   GFRNONAA 99 08/18/2019   CALCIUM 10.2 06/30/2020   AST 16 08/18/2019   ALT 27 08/18/2019   The 10-year ASCVD risk score (Arnett DK, et al., 2019) is: 15.1%  ---------------------------------------------------------------------------------------------------   Medications: Outpatient Medications Prior to Visit  Medication Sig   amLODipine (NORVASC) 2.5 MG tablet TAKE 2 TABLETS(5 MG) BY MOUTH DAILY   benazepril-hydrochlorthiazide (LOTENSIN HCT) 20-12.5 MG tablet Take 1 tablet  by mouth daily.   famotidine (PEPCID) 20 MG tablet Take 1 tablet (20 mg total) by mouth 2 (two) times daily.   HYDROcodone-acetaminophen (NORCO/VICODIN) 5-325 MG tablet Take 1 tablet by mouth every 6 (six) hours as needed for moderate pain.   hyoscyamine (LEVSIN SL) 0.125 MG SL tablet DISSOLVE 1 TABLET(0.125 MG) UNDER THE TONGUE EVERY 4 HOURS AS NEEDED   ibuprofen (ADVIL,MOTRIN) 200 MG tablet Take 400-600 mg by mouth 2 (two) times daily as needed for headache or mild pain.    meloxicam (MOBIC) 15 MG tablet TAKE 1 TABLET BY MOUTH EVERY DAY AS NEEDED FOR PAIN, REPLACES NABUMETONE   No facility-administered medications prior to visit.    Review of Systems  Constitutional:  Negative for appetite change, chills and fever.  Respiratory:  Negative for chest tightness, shortness of breath and wheezing.   Cardiovascular:  Negative for chest pain and palpitations.  Gastrointestinal:  Negative for abdominal pain, nausea and vomiting.       Objective    BP (!) 150/88   Pulse 68   Ht $R'5\' 11"'HA$  (1.803 m)   Wt 221 lb (100.2 kg)   SpO2 100%   BMI 30.82 kg/m    Physical Exam   General: Appearance:    Mildly obese male in no acute distress  Eyes:    PERRL, conjunctiva/corneas clear, EOM's intact       Lungs:     Clear to auscultation bilaterally, respirations unlabored  Heart:    Normal heart rate. Normal rhythm. No murmurs, rubs, or gallops.    MS:  All extremities are intact.    Neurologic:   Awake, alert, oriented x 3. No apparent focal neurological defect.         Assessment & Plan     1. SVT (supraventricular tachycardia) ([place]) Very well controlled.   2. Primary hypertension BP near goal. Anticipate change amlodipine to $RemoveBefor'5mg'bvGewciRFxlS$  tablets with next refill.   3. Aneurysm of ascending aorta without rupture ([place]) Due for surveillnace - CT Angio Chest W/Cm &/Or Wo Cm; Future  4. Hyperlipidemia, mixed Diet controlled.  - CBC - Comprehensive metabolic panel - Lipid panel  5. Prostate  cancer screening  - PSA Total (Reflex To Free)      The entirety of the information documented in the History of Present Illness, Review of Systems and Physical Exam were personally obtained by me. Portions of this information were initially documented by the CMA and reviewed by me for thoroughness and accuracy.     Lelon Huh, MD  [facility] [phone] (phone) [phone] (fax)  [place]

## 2021-08-30 MED ORDER — AMLODIPINE BESYLATE 5 MG PO TABS: 5.0000 mg | ORAL_TABLET | Freq: Every day | ORAL | 2 refills | Status: DC

## 2021-08-30 NOTE — Patient Instructions (Signed)
.   Please review the attached list of medications and notify my office if there are any errors.   . Please bring all of your medications to every appointment so we can make sure that our medication list is the same as yours.   

## 2021-09-09 ENCOUNTER — Other Ambulatory Visit: Payer: Self-pay | Admitting: Family Medicine

## 2021-09-09 DIAGNOSIS — G8929 Other chronic pain: Secondary | ICD-10-CM

## 2021-09-09 DIAGNOSIS — M5020 Other cervical disc displacement, unspecified cervical region: Secondary | ICD-10-CM

## 2021-09-09 MED ORDER — HYDROCODONE-ACETAMINOPHEN 5-325 MG PO TABS: 1.0000 | ORAL_TABLET | Freq: Four times a day (QID) | ORAL | 0 refills | Status: DC | PRN

## 2021-10-10 ENCOUNTER — Other Ambulatory Visit: Payer: Self-pay | Admitting: Family Medicine

## 2021-10-10 DIAGNOSIS — M545 Low back pain, unspecified: Secondary | ICD-10-CM

## 2021-10-10 DIAGNOSIS — M5020 Other cervical disc displacement, unspecified cervical region: Secondary | ICD-10-CM

## 2021-10-11 MED ORDER — HYDROCODONE-ACETAMINOPHEN 5-325 MG PO TABS: 1.0000 | ORAL_TABLET | Freq: Four times a day (QID) | ORAL | 0 refills | Status: DC | PRN

## 2021-10-11 NOTE — Telephone Encounter (Signed)
Requested medication (s) are due for refill today: yes  Requested medication (s) are on the active medication list: yes  Last refill:  09/09/21 #120/0  Future visit scheduled: no  Notes to clinic:  Unable to refill per protocol, cannot delegate.      Requested Prescriptions  Pending Prescriptions Disp Refills   HYDROcodone-acetaminophen (NORCO/VICODIN) 5-325 MG tablet 120 tablet 0    Sig: Take 1 tablet by mouth every 6 (six) hours as needed for moderate pain.     Not Delegated - Analgesics:  Opioid Agonist Combinations Failed - 10/11/2021  9:45 AM      Failed - This refill cannot be delegated      Failed - Urine Drug Screen completed in last 360 days      Passed - Valid encounter within last 3 months    Recent Outpatient Visits           1 month ago SVT (supraventricular tachycardia) [facility])   [facility] Birdie Sons, MD   7 months ago Aneurysm of ascending aorta without rupture   [facility] Birdie Sons, MD   11 months ago Primary hypertension   [facility] Birdie Sons, MD   1 year ago Primary hypertension   [facility] Birdie Sons, MD   1 year ago Primary hypertension   [facility] Birdie Sons, MD

## 2021-10-11 NOTE — Telephone Encounter (Signed)
Copied from [place] [id number]. Topic: General - Inquiry >> Oct 11, 2021  9:22 AM Erskine Squibb wrote: Reason for CRM: The patient called in wanting the provider to know he has sent a refill request in his mychart. Please assist further

## 2021-10-11 NOTE — Telephone Encounter (Signed)
Patient called in to check on the status of his refill request he made on my chart and to make sure it was received. Please assist patient further

## 2021-10-18 ENCOUNTER — Other Ambulatory Visit: Payer: Self-pay | Admitting: Family Medicine

## 2021-10-18 DIAGNOSIS — E782 Mixed hyperlipidemia: Secondary | ICD-10-CM

## 2021-11-10 ENCOUNTER — Other Ambulatory Visit: Payer: Self-pay | Admitting: Family Medicine

## 2021-11-10 DIAGNOSIS — G8929 Other chronic pain: Secondary | ICD-10-CM

## 2021-11-10 DIAGNOSIS — M5020 Other cervical disc displacement, unspecified cervical region: Secondary | ICD-10-CM

## 2021-11-10 MED ORDER — HYDROCODONE-ACETAMINOPHEN 5-325 MG PO TABS: 1.0000 | ORAL_TABLET | Freq: Four times a day (QID) | ORAL | 0 refills | Status: DC | PRN

## 2021-12-09 ENCOUNTER — Telehealth: Payer: Self-pay | Admitting: Family Medicine

## 2021-12-09 DIAGNOSIS — I7121 Aneurysm of the ascending aorta, without rupture: Secondary | ICD-10-CM

## 2021-12-09 DIAGNOSIS — M5020 Other cervical disc displacement, unspecified cervical region: Secondary | ICD-10-CM

## 2021-12-09 DIAGNOSIS — G8929 Other chronic pain: Secondary | ICD-10-CM

## 2021-12-09 DIAGNOSIS — M545 Low back pain, unspecified: Secondary | ICD-10-CM

## 2021-12-09 NOTE — Telephone Encounter (Signed)
Patient has aneurysm of the thoracic aorta and is overdue for CT angiogram of aorta, which has been ordered twice. This needs to be scheduled before any refills can be approved.

## 2021-12-12 MED ORDER — HYDROCODONE-ACETAMINOPHEN 5-325 MG PO TABS
1.0000 | ORAL_TABLET | Freq: Four times a day (QID) | ORAL | 0 refills | Status: DC | PRN
Start: 2021-12-12 — End: 2022-01-10

## 2021-12-12 NOTE — Telephone Encounter (Signed)
Have sent refill, but he needs CT of aneurysm done, is way overdue for it.  Have placed another order. He needs to call back if he doesn't get a call to schedule it by next monthda.

## 2021-12-12 NOTE — Telephone Encounter (Signed)
Pt has called in wanting medication, upset as not refilled. Informed pt that cancelled appts of CT need to be done and and Dr Maralyn Sago nurse will reach out for to him re call [phone]

## 2021-12-13 DIAGNOSIS — E782 Mixed hyperlipidemia: Secondary | ICD-10-CM | POA: Diagnosis not present

## 2021-12-13 DIAGNOSIS — Z125 Encounter for screening for malignant neoplasm of prostate: Secondary | ICD-10-CM | POA: Diagnosis not present

## 2021-12-13 NOTE — Telephone Encounter (Signed)
Pt called back and was provided the message for Dr Caryn Section. Pt has CT scheduled 12/20/21.

## 2021-12-13 NOTE — Telephone Encounter (Signed)
Tried calling patient. Left message to call back. OK for [facility] triage to speak with patient and advise.

## 2021-12-14 LAB — CBC
Hematocrit: 43.6 % (ref 37.5–51.0)
Hemoglobin: 14.8 g/dL (ref 13.0–17.7)
MCH: 30.5 pg (ref 26.6–33.0)
MCHC: 33.9 g/dL (ref 31.5–35.7)
MCV: 90 fL (ref 79–97)
Platelets: 373 10*3/uL (ref 150–450)
RBC: 4.86 x10E6/uL (ref 4.14–5.80)
RDW: 13.7 % (ref 11.6–15.4)
WBC: 8.2 10*3/uL (ref 3.4–10.8)

## 2021-12-14 LAB — COMPREHENSIVE METABOLIC PANEL
ALT: 28 IU/L (ref 0–44)
AST: 18 IU/L (ref 0–40)
Albumin/Globulin Ratio: 1.8 (ref 1.2–2.2)
Albumin: 4.8 g/dL (ref 3.8–4.9)
Alkaline Phosphatase: 72 IU/L (ref 44–121)
BUN/Creatinine Ratio: 12 (ref 9–20)
BUN: 11 mg/dL (ref 6–24)
Bilirubin Total: 0.2 mg/dL (ref 0.0–1.2)
CO2: 24 mmol/L (ref 20–29)
Calcium: 10 mg/dL (ref 8.7–10.2)
Chloride: 100 mmol/L (ref 96–106)
Creatinine, Ser: 0.91 mg/dL (ref 0.76–1.27)
Globulin, Total: 2.7 g/dL (ref 1.5–4.5)
Glucose: 147 mg/dL — ABNORMAL HIGH (ref 70–99)
Potassium: 3.5 mmol/L (ref 3.5–5.2)
Sodium: 143 mmol/L (ref 134–144)
Total Protein: 7.5 g/dL (ref 6.0–8.5)
eGFR: 101 mL/min/{1.73_m2} (ref >59)

## 2021-12-14 LAB — LIPID PANEL
Chol/HDL Ratio: 5 ratio (ref 0.0–5.0)
Cholesterol, Total: 180 mg/dL (ref 100–199)
HDL: 36 mg/dL — ABNORMAL LOW (ref >39)
LDL Chol Calc (NIH): 117 mg/dL — ABNORMAL HIGH (ref 0–99)
Triglycerides: 150 mg/dL — ABNORMAL HIGH (ref 0–149)
VLDL Cholesterol Cal: 27 mg/dL (ref 5–40)

## 2021-12-14 LAB — PSA TOTAL (REFLEX TO FREE): Prostate Specific Ag, Serum: 0.5 ng/mL (ref 0.0–4.0)

## 2021-12-20 ENCOUNTER — Ambulatory Visit
Admission: RE | Admit: 2021-12-20 | Discharge: 2021-12-20 | Disposition: A | Discharge status: Home / Self Care | Payer: BC Managed Care – PPO | Source: Ambulatory Visit | Attending: Family Medicine | Admitting: Family Medicine

## 2021-12-20 ENCOUNTER — Other Ambulatory Visit: Payer: Self-pay | Admitting: Family Medicine

## 2021-12-20 DIAGNOSIS — G8929 Other chronic pain: Secondary | ICD-10-CM

## 2021-12-20 DIAGNOSIS — M5020 Other cervical disc displacement, unspecified cervical region: Secondary | ICD-10-CM

## 2021-12-20 DIAGNOSIS — I7121 Aneurysm of the ascending aorta, without rupture: Secondary | ICD-10-CM | POA: Diagnosis not present

## 2021-12-20 DIAGNOSIS — I712 Thoracic aortic aneurysm, without rupture, unspecified: Secondary | ICD-10-CM | POA: Diagnosis not present

## 2021-12-20 DIAGNOSIS — M545 Low back pain, unspecified: Secondary | ICD-10-CM

## 2021-12-20 DIAGNOSIS — J9811 Atelectasis: Secondary | ICD-10-CM | POA: Diagnosis not present

## 2021-12-20 MED ORDER — IOHEXOL 350 MG/ML SOLN
75.0000 mL | Freq: Once | INTRAVENOUS | Status: AC | PRN
  Administered 2021-12-20: 75 mL via INTRAVENOUS

## 2022-01-10 ENCOUNTER — Other Ambulatory Visit: Payer: Self-pay | Admitting: Family Medicine

## 2022-01-10 DIAGNOSIS — D2261 Melanocytic nevi of right upper limb, including shoulder: Secondary | ICD-10-CM | POA: Diagnosis not present

## 2022-01-10 DIAGNOSIS — M545 Low back pain, unspecified: Secondary | ICD-10-CM

## 2022-01-10 DIAGNOSIS — D2271 Melanocytic nevi of right lower limb, including hip: Secondary | ICD-10-CM | POA: Diagnosis not present

## 2022-01-10 DIAGNOSIS — D2262 Melanocytic nevi of left upper limb, including shoulder: Secondary | ICD-10-CM | POA: Diagnosis not present

## 2022-01-10 DIAGNOSIS — Z85828 Personal history of other malignant neoplasm of skin: Secondary | ICD-10-CM | POA: Diagnosis not present

## 2022-01-10 DIAGNOSIS — M5020 Other cervical disc displacement, unspecified cervical region: Secondary | ICD-10-CM

## 2022-01-10 MED ORDER — HYDROCODONE-ACETAMINOPHEN 5-325 MG PO TABS: 1.0000 | ORAL_TABLET | Freq: Four times a day (QID) | ORAL | 0 refills | Status: DC | PRN

## 2022-02-09 ENCOUNTER — Other Ambulatory Visit: Payer: Self-pay | Admitting: Family Medicine

## 2022-02-09 DIAGNOSIS — M5020 Other cervical disc displacement, unspecified cervical region: Secondary | ICD-10-CM

## 2022-02-09 DIAGNOSIS — M545 Low back pain, unspecified: Secondary | ICD-10-CM

## 2022-02-09 MED ORDER — HYDROCODONE-ACETAMINOPHEN 5-325 MG PO TABS: 1.0000 | ORAL_TABLET | Freq: Four times a day (QID) | ORAL | 0 refills | Status: DC | PRN

## 2022-03-14 ENCOUNTER — Other Ambulatory Visit: Payer: Self-pay | Admitting: Family Medicine

## 2022-03-14 DIAGNOSIS — M5020 Other cervical disc displacement, unspecified cervical region: Secondary | ICD-10-CM

## 2022-03-14 DIAGNOSIS — G8929 Other chronic pain: Secondary | ICD-10-CM

## 2022-03-14 MED ORDER — HYDROCODONE-ACETAMINOPHEN 5-325 MG PO TABS: 1.0000 | ORAL_TABLET | Freq: Four times a day (QID) | ORAL | 0 refills | Status: DC | PRN

## 2022-04-12 ENCOUNTER — Telehealth: Payer: Self-pay | Admitting: Family Medicine

## 2022-04-12 ENCOUNTER — Other Ambulatory Visit: Payer: Self-pay | Admitting: Family Medicine

## 2022-04-12 DIAGNOSIS — E782 Mixed hyperlipidemia: Secondary | ICD-10-CM

## 2022-04-12 DIAGNOSIS — G8929 Other chronic pain: Secondary | ICD-10-CM

## 2022-04-12 DIAGNOSIS — M5020 Other cervical disc displacement, unspecified cervical region: Secondary | ICD-10-CM

## 2022-04-12 MED ORDER — HYDROCODONE-ACETAMINOPHEN 5-325 MG PO TABS: 1.0000 | ORAL_TABLET | Freq: Four times a day (QID) | ORAL | 0 refills | Status: DC | PRN

## 2022-04-12 NOTE — Telephone Encounter (Signed)
This patient is due for follow up office visit and needs to have scheduled before refill can be approved.

## 2022-04-25 ENCOUNTER — Encounter: Payer: Self-pay | Admitting: Family Medicine

## 2022-04-25 ENCOUNTER — Ambulatory Visit: Payer: BC Managed Care – PPO | Admitting: Family Medicine

## 2022-04-25 VITALS — BP 118/70 | HR 100 | Wt 232.0 lb

## 2022-04-25 DIAGNOSIS — Z87891 Personal history of nicotine dependence: Secondary | ICD-10-CM | POA: Diagnosis not present

## 2022-04-25 DIAGNOSIS — M545 Low back pain, unspecified: Secondary | ICD-10-CM | POA: Diagnosis not present

## 2022-04-25 DIAGNOSIS — G8929 Other chronic pain: Secondary | ICD-10-CM

## 2022-04-25 DIAGNOSIS — Z1211 Encounter for screening for malignant neoplasm of colon: Secondary | ICD-10-CM

## 2022-04-25 DIAGNOSIS — M5020 Other cervical disc displacement, unspecified cervical region: Secondary | ICD-10-CM

## 2022-04-25 DIAGNOSIS — Z720 Tobacco use: Secondary | ICD-10-CM

## 2022-04-25 DIAGNOSIS — I10 Essential (primary) hypertension: Secondary | ICD-10-CM

## 2022-04-25 MED ORDER — HYDROCODONE-ACETAMINOPHEN 5-325 MG PO TABS: 1.0000 | ORAL_TABLET | ORAL | Status: DC | PRN

## 2022-04-25 MED ORDER — VENLAFAXINE HCL ER 37.5 MG PO CP24: 37.5000 mg | ORAL_CAPSULE | Freq: Every day | ORAL | 0 refills | Status: DC

## 2022-04-25 NOTE — Progress Notes (Signed)
I,Sha'taria Tyson,acting as a Education administrator for Lelon Huh, MD.,have documented all relevant documentation on the behalf of Lelon Huh, MD,as directed by  Lelon Huh, MD while in the presence of Lelon Huh, MD.   Established patient visit   Patient: Devin Parsons   DOB: 1970-09-05   52 y.o. Male  MRN: [id number] Visit Date: 04/25/2022  Today's healthcare provider: Lelon Huh, MD   Chief Complaint  Patient presents with   Hypertension   Subjective    HPI  Hypertension, follow-up  BP Readings from Last 3 Encounters:  08/26/21 (!) 150/88  03/11/21 140/90  11/12/20 (!) 154/101   Wt Readings from Last 3 Encounters:  08/26/21 221 lb (100.2 kg)  03/11/21 225 lb (102.1 kg)  11/12/20 224 lb (101.6 kg)      Outside blood pressures are not being checked. . Symptoms: No chest pain No chest pressure  No palpitations No syncope  No dyspnea No orthopnea  No paroxysmal nocturnal dyspnea Yes lower extremity edema   Pertinent labs Lab Results  Component Value Date   CHOL 180 12/13/2021   HDL 36 (L) 12/13/2021   LDLCALC 117 (H) 12/13/2021   TRIG 150 (H) 12/13/2021   CHOLHDL 5.0 12/13/2021   Lab Results  Component Value Date   NA 143 12/13/2021   K 3.5 12/13/2021   CREATININE 0.91 12/13/2021   EGFR 101 12/13/2021   GLUCOSE 147 (H) 12/13/2021   TSH 1.140 11/04/2019     The 10-year ASCVD risk score (Arnett DK, et al., 2019) is: 17.5%  ---------------------------------------------------------------------------------------------------  He also reports has been under much more stress the last several months with 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 He would like to trying medication to help with stress level.   Medications: Outpatient Medications Prior to Visit  Medication Sig   amLODipine (NORVASC) 5 MG tablet Take 1 tablet (5 mg total) by mouth daily.   benazepril-hydrochlorthiazide (LOTENSIN HCT) 20-12.5 MG tablet TAKE 1 TABLET BY MOUTH DAILY    famotidine (PEPCID) 20 MG tablet Take 1 tablet (20 mg total) by mouth 2 (two) times daily.   HYDROcodone-acetaminophen (NORCO/VICODIN) 5-325 MG tablet Take 1 tablet by mouth every 6 (six) hours as needed for moderate pain.   hyoscyamine (LEVSIN SL) 0.125 MG SL tablet DISSOLVE 1 TABLET(0.125 MG) UNDER THE TONGUE EVERY 4 HOURS AS NEEDED   ibuprofen (ADVIL,MOTRIN) 200 MG tablet Take 400-600 mg by mouth 2 (two) times daily as needed for headache or mild pain.    meloxicam (MOBIC) 15 MG tablet TAKE 1 TABLET BY MOUTH EVERY DAY AS NEEDED FOR PAIN, REPLACES NABUMETONE   No facility-administered medications prior to visit.    Review of Systems  Constitutional:  Negative for appetite change, chills and fever.  Respiratory:  Negative for chest tightness, shortness of breath and wheezing.   Cardiovascular:  Negative for chest pain and palpitations.  Gastrointestinal:  Negative for abdominal pain, nausea and vomiting.  Psychiatric/Behavioral:  Positive for sleep disturbance. The patient is nervous/anxious.        Objective    BP 118/70 (BP Location: Left Arm, Patient Position: Sitting, Cuff Size: Normal)   Pulse 100   Wt 232 lb (105.2 kg)   SpO2 100%   BMI 32.36 kg/m    Physical Exam   General: Appearance:    Mildly obese male in no acute distress  Eyes:    PERRL, conjunctiva/corneas clear, EOM's intact       Lungs:     Clear  to auscultation bilaterally, respirations unlabored  Heart:    Tachycardic. Normal rhythm. No murmurs, rubs, or gallops.    MS:   All extremities are intact.    Neurologic:   Awake, alert, oriented x 3. No apparent focal neurological defect.         Assessment & Plan     1. Primary hypertension Well controlled.  Continue current medications.    2. Displacement of cervical intervertebral disc without myelopathy  3. Chronic low back pain without sciatica, unspecified back pain laterality hydrocodone/apap remains effective, but occasionally wears off limiting  activities and needs to take an additional dose. With next refill will change instructions to HYDROcodone-acetaminophen (NORCO/VICODIN) 5-325 MG tablet; Take 1 tablet by mouth every 4 (four) hours as needed for moderate pain and will change quantity to 150  4. Stopped smoking with greater than 20 pack year history Had CTA for follow up of enlarged aorta in November. No recommendation for follow up CTA. Will enroll in lung cancer screening program before November 2024.   5. Colon cancer screening  - Cologuard  6. Tobacco abuse Continues to vape  7. Situational stress Secondary to job change within the last year - venlafaxine XR (EFFEXOR XR) 37.5 MG 24 hr capsule; Take 1 capsule (37.5 mg total) by mouth daily with breakfast.  Dispense: 30 capsule; Refill: 0   Will increase to 2 tablets daily in 2 weeks.      The entirety of the information documented in the History of Present Illness, Review of Systems and Physical Exam were personally obtained by me. Portions of this information were initially documented by the CMA and reviewed by me for thoroughness and accuracy.     Lelon Huh, MD  [place] [phone] (phone) [phone] (fax)  [place]

## 2022-04-25 NOTE — Patient Instructions (Addendum)
Please review the attached list of medications and notify my office if there are any errors.   Start Effexor 37.5 by taking one tablet every morning for 2 weeks, then increase to 2 capsules every morning. I'll send in a prescription for 75mg  capsules in a couple of weeks

## 2022-04-28 ENCOUNTER — Ambulatory Visit: Payer: BC Managed Care – PPO | Admitting: Family Medicine

## 2022-05-11 ENCOUNTER — Other Ambulatory Visit: Payer: Self-pay | Admitting: Family Medicine

## 2022-05-11 DIAGNOSIS — M5020 Other cervical disc displacement, unspecified cervical region: Secondary | ICD-10-CM

## 2022-05-11 DIAGNOSIS — G8929 Other chronic pain: Secondary | ICD-10-CM

## 2022-05-11 MED ORDER — HYDROCODONE-ACETAMINOPHEN 5-325 MG PO TABS: 1.0000 | ORAL_TABLET | ORAL | 0 refills | Status: DC | PRN

## 2022-05-11 MED ORDER — VENLAFAXINE HCL ER 75 MG PO CP24: 75.0000 mg | ORAL_CAPSULE | Freq: Every day | ORAL | 1 refills | Status: DC

## 2022-05-27 ENCOUNTER — Other Ambulatory Visit: Payer: Self-pay | Admitting: Family Medicine

## 2022-05-27 DIAGNOSIS — I10 Essential (primary) hypertension: Secondary | ICD-10-CM

## 2022-06-13 ENCOUNTER — Other Ambulatory Visit: Payer: Self-pay | Admitting: Family Medicine

## 2022-06-13 DIAGNOSIS — M545 Low back pain, unspecified: Secondary | ICD-10-CM

## 2022-06-13 DIAGNOSIS — M5020 Other cervical disc displacement, unspecified cervical region: Secondary | ICD-10-CM

## 2022-06-13 MED ORDER — HYDROCODONE-ACETAMINOPHEN 5-325 MG PO TABS: 1.0000 | ORAL_TABLET | ORAL | 0 refills | Status: DC | PRN

## 2022-07-03 ENCOUNTER — Other Ambulatory Visit: Payer: Self-pay | Admitting: Family Medicine

## 2022-07-13 ENCOUNTER — Other Ambulatory Visit: Payer: Self-pay | Admitting: Family Medicine

## 2022-07-13 DIAGNOSIS — M545 Low back pain, unspecified: Secondary | ICD-10-CM

## 2022-07-13 DIAGNOSIS — M5020 Other cervical disc displacement, unspecified cervical region: Secondary | ICD-10-CM

## 2022-07-13 MED ORDER — HYDROCODONE-ACETAMINOPHEN 5-325 MG PO TABS: 1.0000 | ORAL_TABLET | ORAL | 0 refills | Status: DC | PRN

## 2022-08-09 ENCOUNTER — Other Ambulatory Visit: Payer: Self-pay | Admitting: Family Medicine

## 2022-08-09 DIAGNOSIS — M5020 Other cervical disc displacement, unspecified cervical region: Secondary | ICD-10-CM

## 2022-08-09 DIAGNOSIS — M545 Low back pain, unspecified: Secondary | ICD-10-CM

## 2022-08-10 MED ORDER — HYDROCODONE-ACETAMINOPHEN 5-325 MG PO TABS
1.0000 | ORAL_TABLET | ORAL | 0 refills | Status: DC | PRN
Start: 2022-08-10 — End: 2022-09-07

## 2022-09-07 ENCOUNTER — Other Ambulatory Visit: Payer: Self-pay | Admitting: Family Medicine

## 2022-09-07 DIAGNOSIS — M5020 Other cervical disc displacement, unspecified cervical region: Secondary | ICD-10-CM

## 2022-09-07 DIAGNOSIS — M545 Other chronic pain: Secondary | ICD-10-CM

## 2022-09-08 ENCOUNTER — Other Ambulatory Visit: Payer: Self-pay | Admitting: Family Medicine

## 2022-09-08 DIAGNOSIS — M545 Low back pain, unspecified: Secondary | ICD-10-CM

## 2022-09-08 DIAGNOSIS — M5020 Other cervical disc displacement, unspecified cervical region: Secondary | ICD-10-CM

## 2022-09-08 MED ORDER — HYDROCODONE-ACETAMINOPHEN 5-325 MG PO TABS
1.0000 | ORAL_TABLET | ORAL | 0 refills | Status: DC | PRN
Start: 2022-09-08 — End: 2022-10-04

## 2022-09-08 NOTE — Telephone Encounter (Signed)
Medication Refill - Medication: venlafaxine XR (EFFEXOR-XR) 75 MG 24 hr capsule   HYDROcodone-acetaminophen (NORCO/VICODIN) 5-325 MG tablet [[id number]]    Has the patient contacted their pharmacy? Yes.   (Agent: If no, request that the patient contact the pharmacy for the refill. If patient does not wish to contact the pharmacy document the reason why and proceed with request.) (Agent: If yes, when and what did the pharmacy advise?)  Preferred Pharmacy (with phone number or street name):  [id number] [place], [place] - [address] AT [facility] OF [address] [facility] Phone: [phone]  Fax: [phone]     Has the patient been seen for an appointment in the last year OR does the patient have an upcoming appointment? Yes.    Agent: Please be advised that RX refills may take up to 3 business days. We ask that you follow-up with your pharmacy.

## 2022-09-08 NOTE — Telephone Encounter (Signed)
Requested Prescriptions  Pending Prescriptions Disp Refills   venlafaxine XR (EFFEXOR-XR) 75 MG 24 hr capsule [Pharmacy Med Name: VENLAFAXINE ER 75MG  CAPSULES] 30 capsule 1    Sig: TAKE 1 CAPSULE(75 MG) BY MOUTH DAILY WITH BREAKFAST     Psychiatry: Antidepressants - SNRI - desvenlafaxine & venlafaxine Failed - 09/07/2022  8:58 AM      Failed - Lipid Panel in normal range within the last 12 months    Cholesterol, Total  Date Value Ref Range Status  12/13/2021 180 100 - 199 mg/dL Final   LDL Chol Calc (NIH)  Date Value Ref Range Status  12/13/2021 117 (H) 0 - 99 mg/dL Final   HDL  Date Value Ref Range Status  12/13/2021 36 (L) >39 mg/dL Final   Triglycerides  Date Value Ref Range Status  12/13/2021 150 (H) 0 - 149 mg/dL Final         Passed - Cr in normal range and within 360 days    Creatinine, Ser  Date Value Ref Range Status  12/13/2021 0.91 0.76 - 1.27 mg/dL Final         Passed - Last BP in normal range    BP Readings from Last 1 Encounters:  04/25/22 118/70         Passed - Valid encounter within last 6 months    Recent Outpatient Visits           4 months ago Primary hypertension   [place] [facility] Malva Limes, MD   1 year ago SVT (supraventricular tachycardia) [facility])   [place] [facility]va Limes, MD   1 year ago Aneurysm of ascending aorta without rupture   [facility], [facility]   1 year ago Primary hypertension   [place] [facility] Malva Limes, MD   2 years ago Primary hypertension   [place] [facility] Malva Limes, MD       Future Appointments             In 1 month Fisher, Demetrios Isaacs, MD [facility]

## 2022-09-11 NOTE — Telephone Encounter (Signed)
Already refilled on 09/08/22 in a separate refill encounter.  Requested Prescriptions  Pending Prescriptions Disp Refills   HYDROcodone-acetaminophen (NORCO/VICODIN) 5-325 MG tablet 150 tablet 0    Sig: Take 1 tablet by mouth every 4 (four) hours as needed for moderate pain.     Not Delegated - Analgesics:  Opioid Agonist Combinations Failed - 09/08/2022 12:22 PM      Failed - This refill cannot be delegated      Failed - Urine Drug Screen completed in last 360 days      Failed - Valid encounter within last 3 months    Recent Outpatient Visits           4 months ago Primary hypertension   [place] [facility] Malva Limes, MD   1 year ago SVT (supraventricular tachycardia) (HCC)   [place] [facility] Malva Limes, MD   1 year ago Aneurysm of ascending aorta without rupture   [facility] Malva L[facility], MD   1 year ago Primary hypertension   [place] [facility] Malva Limes, MD   2 years ago Primary hypertension   [place] [facility] Malva Limes, MD       Future Appointments             In 1 month Fisher, Demetrios Isaacs, MD [facility]            Refused Prescriptions Disp Refills   venlafaxine XR (EFFEXOR-XR) 75 MG 24 hr capsule 90 capsule 0     Psychiatry: Antidepressants - SNRI - desvenlafaxine & venlafaxine Failed - 09/08/2022 12:22 PM      Failed - Lipid Panel in normal range within the last 12 months    Cholesterol, Total  Date Value Ref Range Status  12/13/2021 180 100 - 199 mg/dL Final   LDL Chol Calc (NIH)  Date Value Ref Range Status  12/13/2021 117 (H) 0 - 99 mg/dL Final   HDL  Date Value Ref Range Status  12/13/2021 36 (L) >39 mg/dL Final   Triglycerides  Date Value Ref Range Status  12/13/2021 150 (H) 0 - 149 mg/dL Final         Passed - Cr in normal range and within 360 days    Creatinine, Ser   Date Value Ref Range Status  12/13/2021 0.91 0.76 - 1.27 mg/dL Final         Passed - Last BP in normal range    BP Readings from Last 1 Encounters:  04/25/22 118/70         Passed - Valid encounter within last 6 months    Recent Outpatient Visits           4 months ago Primary hypertension   [place] [facility] Malva Limes, MD   1 year ago SVT (supraventricular [facility])   [place] [facility] Malva Limes, MD   1 year ago Aneurysm of ascending aorta without rupture   [facility] Malva Limes, MD   1 year ago Primary hypertension   [place] [facility] Malva Limes, MD   2 years ago Primary hypertension   [place] [facility] Malva Limes, MD       Future Appointments             In 1 month Fisher, Demetrios Isaacs, MD [facility]

## 2022-09-11 NOTE — Telephone Encounter (Signed)
Requested medication (s) are due for refill today: No  Requested medication (s) are on the active medication list: Yes  Last refill:  09/08/22  Future visit scheduled: Yes  Notes to clinic:  Unable to refill per protocol, cannot delegate. Unable to refuse     Requested Prescriptions  Pending Prescriptions Disp Refills   HYDROcodone-acetaminophen (NORCO/VICODIN) 5-325 MG tablet 150 tablet 0    Sig: Take 1 tablet by mouth every 4 (four) hours as needed for moderate pain.     Not Delegated - Analgesics:  Opioid Agonist Combinations Failed - 09/08/2022 12:22 PM      Failed - This refill cannot be delegated      Failed - Urine Drug Screen completed in last 360 days      Failed - Valid encounter within last 3 months    Recent Outpatient Visits           4 months ago Primary hypertension   [place] [facility] Malva Limes, MD   1 year ago SVT (supraventricular tachycardia) (HCC)   [place] [facility] Malva Limes, MD   1 year ago Aneurysm of ascending aorta without rupture   [facility]   1 year ago Primary hypertension   [place] [facility] Malva Limes, MD   2 years ago Primary hypertension   [place] [facility] Malva Limes, MD       Future Appointments             In 1 month Fisher, Demetrios Isaacs, MD [facility]            Refused Prescriptions Disp Refills   venlafaxine XR (EFFEXOR-XR) 75 MG 24 hr capsule 90 capsule 0     Psychiatry: Antidepressants - SNRI - desvenlafaxine & venlafaxine Failed - 09/08/2022 12:22 PM      Failed - Lipid Panel in normal range within the last 12 months    Cholesterol, Total  Date Value Ref Range Status  12/13/2021 180 100 - 199 mg/dL Final   LDL Chol Calc (NIH)  Date Value Ref Range Status  12/13/2021 117 (H) 0 - 99 mg/dL Final   HDL  Date Value Ref Range Status   12/13/2021 36 (L) >39 mg/dL Final   Triglycerides  Date Value Ref Range Status  12/13/2021 150 (H) 0 - 149 mg/dL Final         Passed - Cr in normal range and within 360 days    Creatinine, Ser  Date Value Ref Range Status  12/13/2021 0.91 0.76 - 1.27 mg/dL Final         Passed - Last BP in normal range    BP Readings from Last 1 Encounters:  04/25/22 118/70         Passed - Valid encounter within last 6 months    Recent Outpatient Visits           4 months ago Primary hypertension   [place] [facility] Mal[facility] Limes, MD   1 year ago [facility]T (supraventricular tachy[facility]    [facility]   1 year ago Aneurysm of ascending aorta without rupture   [facility], [facility]   1 year ago Primary hypertension    [facility] Mal[facility], MD   2 years ago Primary hypertension  [facility] Sherrie Mustache, Demetrios Isaacs, MD       Future Appointments             In 1 month Fisher, Demetrios Isaacs, MD [facility]

## 2022-10-04 ENCOUNTER — Other Ambulatory Visit: Payer: Self-pay | Admitting: Family Medicine

## 2022-10-04 DIAGNOSIS — M545 Low back pain, unspecified: Secondary | ICD-10-CM

## 2022-10-04 DIAGNOSIS — M5020 Other cervical disc displacement, unspecified cervical region: Secondary | ICD-10-CM

## 2022-10-04 MED ORDER — HYDROCODONE-ACETAMINOPHEN 5-325 MG PO TABS
1.0000 | ORAL_TABLET | ORAL | 0 refills | Status: DC | PRN
Start: 2022-10-04 — End: 2022-10-31

## 2022-10-13 ENCOUNTER — Other Ambulatory Visit: Payer: Self-pay | Admitting: Family Medicine

## 2022-10-13 DIAGNOSIS — E782 Mixed hyperlipidemia: Secondary | ICD-10-CM

## 2022-10-16 NOTE — Telephone Encounter (Signed)
Requested Prescriptions  Pending Prescriptions Disp Refills   benazepril-hydrochlorthiazide (LOTENSIN HCT) 20-12.5 MG tablet [Pharmacy Med Name: BENAZEPRIL/HCTZ 20/12.5MG  TABLETS] 90 tablet 0    Sig: TAKE 1 TABLET BY MOUTH DAILY     Cardiovascular:  ACEI + Diuretic Combos Failed - 10/13/2022  5:23 AM      Failed - Na in normal range and within 180 days    Sodium  Date Value Ref Range Status  12/13/2021 143 134 - 144 mmol/L Final         Failed - K in normal range and within 180 days    Potassium  Date Value Ref Range Status  12/13/2021 3.5 3.5 - 5.2 mmol/L Final         Failed - Cr in normal range and within 180 days    Creatinine, Ser  Date Value Ref Range Status  12/13/2021 0.91 0.76 - 1.27 mg/dL Final         Failed - eGFR is 30 or above and within 180 days    GFR calc Af Amer  Date Value Ref Range Status  08/18/2019 114 >59 mL/min/1.73 Final    Comment:    **[facility] currently reports eGFR in compliance with the current**   recommendations of the [employer]. [facility] will   update reporting as new guidelines are published from the NKF-ASN   Task force.    GFR calc non Af Amer  Date Value Ref Range Status  08/18/2019 99 >59 mL/min/1.73 Final   eGFR  Date Value Ref Range Status  12/13/2021 101 >59 mL/min/1.73 Final         Passed - Patient is not pregnant      Passed - Last BP in normal range    BP Readings from Last 1 Encounters:  04/25/22 118/70         Passed - Valid encounter within last 6 months    Recent Outpatient Visits           5 months ago Primary hypertension   [place] [facility] Malva Limes, MD   1 year ago SVT (supraventricular tachycardia) [facility]   [place] [facility] Malva Limes, MD   1 year ago Aneurysm of ascending aorta without rupture   [facility], [facility]   1 year ago Primary hypertension   [facility]  Malva Limes, MD   2 years ago Primary hypertension   [place] [facility] Malva Limes, MD       Future Appointments             In 1 week Fisher, Demetrios Isaacs, MD [facility]

## 2022-10-25 ENCOUNTER — Encounter: Payer: Self-pay | Admitting: Family Medicine

## 2022-10-25 ENCOUNTER — Ambulatory Visit (INDEPENDENT_AMBULATORY_CARE_PROVIDER_SITE_OTHER): Payer: BC Managed Care – PPO | Admitting: Family Medicine

## 2022-10-25 VITALS — BP 129/90 | HR 75 | Resp 16 | Ht 71.0 in | Wt 241.0 lb

## 2022-10-25 DIAGNOSIS — M5020 Other cervical disc displacement, unspecified cervical region: Secondary | ICD-10-CM

## 2022-10-25 DIAGNOSIS — Z125 Encounter for screening for malignant neoplasm of prostate: Secondary | ICD-10-CM

## 2022-10-25 DIAGNOSIS — I10 Essential (primary) hypertension: Secondary | ICD-10-CM

## 2022-10-25 DIAGNOSIS — Z87891 Personal history of nicotine dependence: Secondary | ICD-10-CM

## 2022-10-25 DIAGNOSIS — Z1211 Encounter for screening for malignant neoplasm of colon: Secondary | ICD-10-CM

## 2022-10-25 DIAGNOSIS — Z0000 Encounter for general adult medical examination without abnormal findings: Secondary | ICD-10-CM

## 2022-10-25 DIAGNOSIS — M545 Low back pain, unspecified: Secondary | ICD-10-CM

## 2022-10-25 DIAGNOSIS — E782 Mixed hyperlipidemia: Secondary | ICD-10-CM

## 2022-10-25 DIAGNOSIS — F39 Unspecified mood [affective] disorder: Secondary | ICD-10-CM

## 2022-10-25 DIAGNOSIS — G8929 Other chronic pain: Secondary | ICD-10-CM

## 2022-10-25 MED ORDER — BUSPIRONE HCL 7.5 MG PO TABS
ORAL_TABLET | ORAL | 1 refills | Status: DC
Start: 2022-10-25 — End: 2022-11-01

## 2022-10-25 NOTE — Progress Notes (Signed)
Complete physical exam   Patient: Devin Parsons   DOB: Dec 11, 1970   52 y.o. Male  MRN: [id number] Visit Date: 10/25/2022  Today's healthcare provider: Mila Merry, MD   Chief Complaint  Patient presents with   Annual Exam   Hypertension   Subjective    Discussed the use of AI scribe software for clinical note transcription with the patient, who gave verbal consent to proceed.  History of Present Illness   The patient, with a history of anxiety, hypertension, anxiety, and chornic back pain presents for routine yearly physical. He reports that his current medication. He states Effexor, does not seem to be effective for anxiety. He has been on this medication for approximately six months and has not noticed any significant improvement in his symptoms. The patient also reports a decrease in libido, which he attributes to both the medication and age. He has been experiencing difficulty sleeping and resting at night. The patient also mentions occasional coughing, which he believes is due to allergies and the adhesive from his dentures.  The patient has a history of smoking but quit approximately six months ago. He admits to occasional smoking when stressed but has significantly reduced his intake from a pack a day. He consumes one energy drink daily for caffeine intake, but avoids caffeine in the evening.  The patient is also on amlodipine and , benazepril-hctz for hypertension which he reports is well-controlled with no associated issues. He denies any episodes of heart racing or fluttering. He has been compliant with his medication regimen and has not started any new medications recently.  The patient has not completed a colon cancer screening, despite having received a Cologuard box. He expresses a preference for a colonoscopy for its long-term reassurance. He also has a history of aortic scans, the last of which was conducted in November of the previous year. The patient is open to the  recommendation of a lung scan for smokers and ex-smokers.         Past Medical History:  Diagnosis Date   COVID-19 12/24/2018   By patient reports tested positive at [facility]   History of chicken pox    SVT (supraventricular tachycardia) (HCC)    a. echo 07/2014: EF 50-55%, no RWMA, GR1DD, PASP nl   Syncope    Past Surgical History:  Procedure Laterality Date   FRACTURE SURGERY     KNEE SURGERY Left    Arthroscopy surgery performed   MOHS SURGERY Right 10/08/2017   [place] Dr. Frazier Butt   MRI OF Lumbar spine  06/14/2004   Results: Herniated disc with facet reaction L5/S1. 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   Vaping Use   Vaping status: Every Day  Start date: 09/23/2021  Substance and Sexual Activity   Alcohol use: No    Alcohol/week: 0.0 standard drinks of alcohol    Comment: formerly a heavy drinker in high school   Drug use: No   Sexual activity: Yes  Other Topics Concern   Not on file  Social History Narrative   Not on file   Social Determinants of Health   Financial Resource Strain: Not on file  Food Insecurity: No Food Insecurity (10/25/2022)   Hunger Vital Sign    Worried About Running Out of Food in the Last Year: Never true    Ran Out of Food in the Last Year: Never true  Transportation Needs: No Transportation Needs (10/25/2022)   PRAPARE -  Administrator, Civil Service (Medical): No    Lack of Transportation (Non-Medical): No  Physical Activity: Not on file  Stress: Not on file  Social Connections: Not on file  Intimate Partner Violence: Not At Risk (10/25/2022)   Humiliation, Afraid, Rape, and Kick questionnaire    Fear of Current or Ex-Partner: No    Emotionally Abused: No    Physically Abused: No    Sexually Abused: No   Family Status  Relation Name Status   Mother  Alive   Father UNKNOWN Other   Son  Alive  No partnership data on file   Family History  Problem Relation Age of Onset   Diabetes Mother    No Known Allergies  Patient Care Team: Malva Limes, MD as PCP - General (Family Medicine)   Medications: Outpatient Medications Prior to Visit  Medication Sig   amLODipine (NORVASC) 5 MG tablet TAKE 1 TABLET(5 MG) BY MOUTH DAILY   benazepril-hydrochlorthiazide (LOTENSIN HCT) 20-12.5 MG tablet TAKE 1 TABLET BY MOUTH DAILY   famotidine (PEPCID) 20 MG tablet Take 1 tablet (20 mg total) by mouth 2 (two) times daily.   HYDROcodone-acetaminophen (NORCO/VICODIN) 5-325 MG tablet Take 1 tablet by mouth every 4 (four) hours as needed for moderate pain.   hyoscyamine (LEVSIN SL) 0.125 MG SL tablet DISSOLVE 1 TABLET(0.125 MG) UNDER THE TONGUE EVERY 4 HOURS AS NEEDED   ibuprofen (ADVIL,MOTRIN) 200 MG tablet Take 400-600 mg by mouth 2 (two) times daily as needed for headache or mild pain.    meloxicam (MOBIC) 15 MG tablet TAKE 1 TABLET BY MOUTH EVERY DAY AS NEEDED FOR PAIN, REPLACES NABUMETONE   venlafaxine XR (EFFEXOR-XR) 75 MG 24 hr capsule TAKE 1 CAPSULE(75 MG) BY MOUTH DAILY WITH BREAKFAST   No facility-administered medications prior to visit.    Review of Systems    Objective    BP (!) 129/90   Pulse 75   Resp 16   Ht 5\' 11"  (1.803 m)   Wt 241 lb (109.3 kg)   BMI 33.61 kg/m    Physical Exam  General Appearance:    Mildly obese male. Alert, cooperative, in no acute distress, appears  stated age  Head:    Normocephalic, without obvious abnormality, atraumatic  Eyes:    PERRL, conjunctiva/corneas clear, EOM's intact, fundi    benign, both eyes       Ears:    Normal TM's and external ear canals, both ears  Nose:   Nares normal, septum midline, mucosa normal, no drainage   or sinus tenderness  Throat:   Lips, mucosa, and tongue normal; teeth and gums normal  Neck:   Supple, symmetrical, trachea midline, no adenopathy;       thyroid:  No enlargement/tenderness/nodules;  no carotid   bruit or JVD  Back:     Symmetric, no curvature, ROM normal, no CVA tenderness  Lungs:     Clear to auscultation bilaterally, respirations unlabored  Chest wall:    No tenderness or deformity  Heart:    Normal heart rate. Normal rhythm. No murmurs, rubs, or gallops.  S1 and S2 normal  Abdomen:     Soft, non-tender, bowel sounds active all four quadrants,    no masses, no organomegaly  Genitalia:    deferred  Rectal:    deferred  Extremities:   All extremities are intact. No cyanosis or edema  Pulses:   2+ and symmetric all extremities  Skin:   Skin color, texture, turgor normal, no rashes or lesions  Lymph nodes:   Cervical, supraclavicular, and axillary nodes normal  Neurologic:   CNII-XII intact. Normal strength, sensation and reflexes      throughout       Last depression screening scores    10/25/2022    8:22 AM 04/25/2022    8:13 AM 03/11/2021    1:44 PM  PHQ 2/9 Scores  PHQ - 2 Score 4 4 0  PHQ- 9 Score 16 15 3    Last fall risk screening    10/25/2022    8:22 AM  Fall Risk   Falls in the past year? 0  Number falls in past yr: 0  Injury with Fall? 0  Risk for fall due to : No Fall Risks  Follow up Falls prevention discussed   Last Audit-C alcohol use screening    04/25/2022    8:12 AM  Alcohol Use Disorder Test (AUDIT)  1. How often do you have a drink containing alcohol? 0  2. How many drinks containing alcohol do you have on a typical day when you are drinking? 0  3.  How often do you have six or more drinks on one occasion? 0  AUDIT-C Score 0   A score of 3 or more in women, and 4 or more in men indicates increased risk for alcohol abuse, EXCEPT if all of the points are from question 1   No results found for any visits on 10/25/22.  Assessment & Plan    Routine Health Maintenance and Physical Exam  Exercise Activities and Dietary recommendations  Goals   None     Immunization History  Administered Date(s) Administered   Influenza,inj,Quad PF,6+ Mos 11/08/2016   Influenza,inj,quad, With Preservative 11/06/2017, 11/04/2018   Influenza-Unspecified 11/24/2015, 08/29/2017, 11/04/2019, 10/10/2021   Td 01/24/2007    Health Maintenance  Topic Date Due   HIV Screening  Never done   Colonoscopy  Never done   DTaP/Tdap/Td (2 - Tdap) 01/23/2017   INFLUENZA VACCINE  08/24/2022   COVID-19 Vaccine (1) 11/10/2022 (Originally 01/29/1975)   Zoster Vaccines- Shingrix (1 of 2) 01/25/2023 (Originally 01/30/1987)   Lung Cancer Screening  12/21/2022   Hepatitis C Screening  Completed   HPV VACCINES  Aged Out    Discussed health benefits of physical activity, and encouraged him to engage in regular exercise appropriate for his age and condition.     Anxiety Effexor not effective and causing decreased libido. No other side effects reported. Difficulty sleeping noted. -Wean off Effexor over two weeks, taking it every other day for one week, then every third day for one week. -Start Buspirone, beginning with a 1/2 daily and titrating up to 1 tablet twice a day.   Smoking Cessation Quit smoking six months ago. Occasional  cough reported, possibly due to allergies or denture adhesive. -Continue smoking cessation. -Order referral for lung cancer screening   Colon Cancer Screening No colon cancer screening completed to date. -Refer for colonoscopy.   Chronic back pain -Doing well on current dose of hydrocodone/apap     Return in about 2 months (around  12/25/2022).      Mila Merry, MD  [facility] [phone] (phone) [phone] (fax)  [facility]

## 2022-10-25 NOTE — Patient Instructions (Signed)
Please review the attached list of medications and notify my office if there are any errors.   Wean Effexor by taking 1 capsule every other day for at least a week, then 1 capsule every third day for at least a week

## 2022-10-26 LAB — CBC
Hematocrit: 45.1 % (ref 37.5–51.0)
Hemoglobin: 14.9 g/dL (ref 13.0–17.7)
MCH: 30.4 pg (ref 26.6–33.0)
MCHC: 33 g/dL (ref 31.5–35.7)
MCV: 92 fL (ref 79–97)
Platelets: 364 10*3/uL (ref 150–450)
RBC: 4.9 x10E6/uL (ref 4.14–5.80)
RDW: 13.1 % (ref 11.6–15.4)
WBC: 7.5 10*3/uL (ref 3.4–10.8)

## 2022-10-26 LAB — LIPID PANEL
Chol/HDL Ratio: 7.3 {ratio} — ABNORMAL HIGH (ref 0.0–5.0)
Cholesterol, Total: 218 mg/dL — ABNORMAL HIGH (ref 100–199)
HDL: 30 mg/dL — ABNORMAL LOW (ref >39)
LDL Chol Calc (NIH): 149 mg/dL — ABNORMAL HIGH (ref 0–99)
Triglycerides: 214 mg/dL — ABNORMAL HIGH (ref 0–149)
VLDL Cholesterol Cal: 39 mg/dL (ref 5–40)

## 2022-10-26 LAB — COMPREHENSIVE METABOLIC PANEL
ALT: 32 [IU]/L (ref 0–44)
AST: 23 [IU]/L (ref 0–40)
Albumin: 4.5 g/dL (ref 3.8–4.9)
Alkaline Phosphatase: 70 [IU]/L (ref 44–121)
BUN/Creatinine Ratio: 14 (ref 9–20)
BUN: 12 mg/dL (ref 6–24)
Bilirubin Total: 0.3 mg/dL (ref 0.0–1.2)
CO2: 24 mmol/L (ref 20–29)
Calcium: 9.9 mg/dL (ref 8.7–10.2)
Chloride: 102 mmol/L (ref 96–106)
Creatinine, Ser: 0.86 mg/dL (ref 0.76–1.27)
Globulin, Total: 2.7 g/dL (ref 1.5–4.5)
Glucose: 106 mg/dL — ABNORMAL HIGH (ref 70–99)
Potassium: 4 mmol/L (ref 3.5–5.2)
Sodium: 139 mmol/L (ref 134–144)
Total Protein: 7.2 g/dL (ref 6.0–8.5)
eGFR: 103 mL/min/{1.73_m2} (ref >59)

## 2022-10-26 LAB — PSA: Prostate Specific Ag, Serum: 0.8 ng/mL (ref 0.0–4.0)

## 2022-10-31 ENCOUNTER — Other Ambulatory Visit: Payer: Self-pay | Admitting: Family Medicine

## 2022-10-31 DIAGNOSIS — M5020 Other cervical disc displacement, unspecified cervical region: Secondary | ICD-10-CM

## 2022-10-31 DIAGNOSIS — G8929 Other chronic pain: Secondary | ICD-10-CM

## 2022-10-31 MED ORDER — HYDROCODONE-ACETAMINOPHEN 5-325 MG PO TABS: 1.0000 | ORAL_TABLET | ORAL | 0 refills | Status: DC | PRN

## 2022-11-01 ENCOUNTER — Encounter: Payer: Self-pay | Admitting: Family Medicine

## 2022-11-01 DIAGNOSIS — F39 Unspecified mood [affective] disorder: Secondary | ICD-10-CM

## 2022-11-01 MED ORDER — BUSPIRONE HCL 10 MG PO TABS
ORAL_TABLET | ORAL | 1 refills | Status: DC
Start: 2022-11-01 — End: 2023-02-03

## 2022-11-07 ENCOUNTER — Encounter: Payer: Self-pay | Admitting: *Deleted

## 2022-11-24 ENCOUNTER — Other Ambulatory Visit: Payer: Self-pay | Admitting: Family Medicine

## 2022-11-24 DIAGNOSIS — G8929 Other chronic pain: Secondary | ICD-10-CM

## 2022-11-24 DIAGNOSIS — M5020 Other cervical disc displacement, unspecified cervical region: Secondary | ICD-10-CM

## 2022-11-24 DIAGNOSIS — I10 Essential (primary) hypertension: Secondary | ICD-10-CM

## 2022-11-24 DIAGNOSIS — M545 Low back pain, unspecified: Secondary | ICD-10-CM

## 2022-11-24 MED ORDER — HYDROCODONE-ACETAMINOPHEN 5-325 MG PO TABS
1.0000 | ORAL_TABLET | ORAL | 0 refills | Status: DC | PRN
Start: 2022-11-24 — End: 2022-12-20

## 2022-12-04 DIAGNOSIS — F331 Major depressive disorder, recurrent, moderate: Secondary | ICD-10-CM | POA: Diagnosis not present

## 2022-12-18 DIAGNOSIS — F331 Major depressive disorder, recurrent, moderate: Secondary | ICD-10-CM | POA: Diagnosis not present

## 2022-12-20 ENCOUNTER — Other Ambulatory Visit: Payer: Self-pay | Admitting: Family Medicine

## 2022-12-20 DIAGNOSIS — M5020 Other cervical disc displacement, unspecified cervical region: Secondary | ICD-10-CM

## 2022-12-20 DIAGNOSIS — M545 Low back pain, unspecified: Secondary | ICD-10-CM

## 2022-12-20 MED ORDER — HYDROCODONE-ACETAMINOPHEN 5-325 MG PO TABS: 1.0000 | ORAL_TABLET | ORAL | 0 refills | Status: DC | PRN

## 2022-12-25 ENCOUNTER — Ambulatory Visit: Payer: BC Managed Care – PPO | Admitting: Family Medicine

## 2022-12-25 VITALS — BP 127/104 | HR 98 | Resp 16 | Ht 71.0 in | Wt 246.0 lb

## 2022-12-25 DIAGNOSIS — E782 Mixed hyperlipidemia: Secondary | ICD-10-CM

## 2022-12-25 DIAGNOSIS — F39 Unspecified mood [affective] disorder: Secondary | ICD-10-CM | POA: Diagnosis not present

## 2022-12-25 NOTE — Progress Notes (Signed)
Established patient visit   Patient: Devin Parsons   DOB: 03-14-1970   52 y.o. Male  MRN: [id number] Visit Date: 12/25/2022  Today's healthcare provider: Mila Merry, MD   Chief Complaint  Patient presents with   Hypertension   Subjective    Discussed the use of AI scribe software for clinical note transcription with the patient, who gave verbal consent to proceed.  History of Present Illness   The patient presents for follow up, under treatment for anxiety since adding buspirone to previous regimen of venlafaxine at his last visit in August. He reports he didn't realize he was supposed to titrate to BID, so is only taking 10mg  once a day,He has recently initiated therapy sessions, currently scheduled once a week. The patient reported weight gain, which he was unsure if it was due to the medication, the holiday season, or aging. He has not noticed any other side effects from the medication.  He was also noted to have high cholesterol at his recent physical. The patient also reported a change in his diet, reducing red meat intake and increasing chicken consumption, in an attempt to manage high cholesterol levels. He admitted to some consumption of pork products, particularly during the holiday season. The patient also mentioned a lack of regular exercise, apart from physical activity at his job.       Medications: Outpatient Medications Prior to Visit  Medication Sig   amLODipine (NORVASC) 5 MG tablet TAKE 1 TABLET(5 MG) BY MOUTH DAILY   benazepril-hydrochlorthiazide (LOTENSIN HCT) 20-12.5 MG tablet TAKE 1 TABLET BY MOUTH DAILY   busPIRone (BUSPAR) 10 MG tablet Take 1/2 tablet every night for 6 days, then 1/2 tablet twice a day for 6 days, then 1/2 tablet in the morning and 1 tablet at night for 6 days, then 1 tablet twice a day (Patient taking differently: Take 10 mg by mouth. Take 1/2 tablet every night for 6 days, then 1/2 tablet twice a day for 6 days, then 1/2 tablet in the  morning and 1 tablet at night for 6 days, then 1 tablet twice a day   Only taking once daily)   famotidine (PEPCID) 20 MG tablet Take 1 tablet (20 mg total) by mouth 2 (two) times daily.   HYDROcodone-acetaminophen (NORCO/VICODIN) 5-325 MG tablet Take 1 tablet by mouth every 4 (four) hours as needed for moderate pain (pain score 4-6).   hyoscyamine (LEVSIN SL) 0.125 MG SL tablet DISSOLVE 1 TABLET(0.125 MG) UNDER THE TONGUE EVERY 4 HOURS AS NEEDED   ibuprofen (ADVIL,MOTRIN) 200 MG tablet Take 400-600 mg by mouth 2 (two) times daily as needed for headache or mild pain.    meloxicam (MOBIC) 15 MG tablet TAKE 1 TABLET BY MOUTH EVERY DAY AS NEEDED FOR PAIN, REPLACES NABUMETONE   venlafaxine XR (EFFEXOR-XR) 75 MG 24 hr capsule TAKE 1 CAPSULE(75 MG) BY MOUTH DAILY WITH BREAKFAST (Patient not taking: Reported on 12/25/2022)   No facility-administered medications prior to visit.       Objective    BP (!) 127/104   Pulse 98   Resp 16   Ht 5\' 11"  (1.803 m)   Wt 246 lb (111.6 kg)   SpO2 100%   BMI 34.31 kg/m   Physical Exam  Awake, alert, oriented x 3. In no apparent distress   Assessment & Plan        Anxiety Patient was not taking Buspirone as prescribed (once daily instead of twice daily). No reported side effects, but  patient reports possible weight gain. Patient has also started therapy. -Increase Buspirone to twice daily as prescribed. Start with half a tablet for 4-6 days to minimize risk of dizziness. -Continue therapy sessions weekly.  Hyperlipidemia Patient's cholesterol levels were high at last check. Patient has made dietary changes to reduce red meat intake. -Continue dietary modifications to lower cholesterol. -Check cholesterol levels at next visit in February.  General Health Maintenance / Followup Plans -Schedule follow-up appointment in February. -Order blood work for next visit.    Return in about 2 months (around 02/25/2023).      Mila Merry, MD  [facility] [phone] (phone) [phone] (fax)  [facility]

## 2022-12-25 NOTE — Patient Instructions (Signed)
.   Please review the attached list of medications and notify my office if there are any errors.   . Please bring all of your medications to every appointment so we can make sure that our medication list is the same as yours.   

## 2023-01-03 DIAGNOSIS — F331 Major depressive disorder, recurrent, moderate: Secondary | ICD-10-CM | POA: Diagnosis not present

## 2023-01-09 ENCOUNTER — Other Ambulatory Visit: Payer: Self-pay | Admitting: Family Medicine

## 2023-01-09 DIAGNOSIS — E782 Mixed hyperlipidemia: Secondary | ICD-10-CM

## 2023-01-09 NOTE — Telephone Encounter (Signed)
Requested Prescriptions  Pending Prescriptions Disp Refills   benazepril-hydrochlorthiazide (LOTENSIN HCT) 20-12.5 MG tablet [Pharmacy Med Name: BENAZEPRIL/HCTZ 20/12.5MG  TABLETS] 90 tablet 0    Sig: TAKE 1 TABLET BY MOUTH DAILY     Cardiovascular:  ACEI + Diuretic Combos Failed - 01/09/2023  5:31 PM      Failed - Last BP in normal range    BP Readings from Last 1 Encounters:  12/25/22 (!) 127/104         Passed - Na in normal range and within 180 days    Sodium  Date Value Ref Range Status  10/25/2022 139 134 - 144 mmol/L Final         Passed - K in normal range and within 180 days    Potassium  Date Value Ref Range Status  10/25/2022 4.0 3.5 - 5.2 mmol/L Final         Passed - Cr in normal range and within 180 days    Creatinine, Ser  Date Value Ref Range Status  10/25/2022 0.86 0.76 - 1.27 mg/dL Final         Passed - eGFR is 30 or above and within 180 days    GFR calc Af Amer  Date Value Ref Range Status  08/18/2019 114 >59 mL/min/1.73 Final    Comment:    **[facility] currently reports eGFR in compliance with the current**   recommendations of the [employer]. [facility] will   update reporting as new guidelines are published from the NKF-ASN   Task force.    GFR calc non Af Amer  Date Value Ref Range Status  08/18/2019 99 >59 mL/min/1.73 Final   eGFR  Date Value Ref Range Status  10/25/2022 103 >59 mL/min/1.73 Final         Passed - Patient is not pregnant      Passed - Valid encounter within last 6 months    Recent Outpatient Visits           2 weeks ago Episodic mood disorder [facility])   [facility] Malva Limes, MD   2 months ago Annual physical exam   [facility] Malva Limes, MD   8 months ago Primary hypertension   [place] [facility]   1 year ago SVT (supraventricular tachycardia) [facility])   [place] [facility] Malva Limes, MD   1 year ago Aneurysm of ascending aorta without rupture   [facility] Malva Limes, MD       Future Appointments             In 1 month Fisher, Demetrios Isaacs, MD [facility]

## 2023-01-12 ENCOUNTER — Other Ambulatory Visit: Payer: Self-pay | Admitting: Family Medicine

## 2023-01-12 DIAGNOSIS — G8929 Other chronic pain: Secondary | ICD-10-CM

## 2023-01-12 DIAGNOSIS — M5020 Other cervical disc displacement, unspecified cervical region: Secondary | ICD-10-CM

## 2023-01-13 MED ORDER — HYDROCODONE-ACETAMINOPHEN 5-325 MG PO TABS: 1.0000 | ORAL_TABLET | ORAL | 0 refills | Status: DC | PRN

## 2023-01-25 DIAGNOSIS — F331 Major depressive disorder, recurrent, moderate: Secondary | ICD-10-CM | POA: Diagnosis not present

## 2023-02-01 ENCOUNTER — Other Ambulatory Visit: Payer: Self-pay | Admitting: Family Medicine

## 2023-02-01 DIAGNOSIS — F39 Unspecified mood [affective] disorder: Secondary | ICD-10-CM

## 2023-02-02 NOTE — Telephone Encounter (Signed)
**Note De-identified  [place] Obfuscation** Please advise 

## 2023-02-08 ENCOUNTER — Other Ambulatory Visit: Payer: Self-pay | Admitting: Family Medicine

## 2023-02-08 DIAGNOSIS — M5020 Other cervical disc displacement, unspecified cervical region: Secondary | ICD-10-CM

## 2023-02-08 DIAGNOSIS — M545 Low back pain, unspecified: Secondary | ICD-10-CM

## 2023-02-08 DIAGNOSIS — F331 Major depressive disorder, recurrent, moderate: Secondary | ICD-10-CM | POA: Diagnosis not present

## 2023-02-09 MED ORDER — HYDROCODONE-ACETAMINOPHEN 5-325 MG PO TABS: 1.0000 | ORAL_TABLET | ORAL | 0 refills | Status: DC | PRN

## 2023-02-21 DIAGNOSIS — F331 Major depressive disorder, recurrent, moderate: Secondary | ICD-10-CM | POA: Diagnosis not present

## 2023-02-26 ENCOUNTER — Ambulatory Visit: Payer: BC Managed Care – PPO | Admitting: Family Medicine

## 2023-02-26 VITALS — BP 121/87 | HR 82 | Temp 98.3°F | Resp 16 | Ht 71.0 in | Wt 241.0 lb

## 2023-02-26 DIAGNOSIS — F39 Unspecified mood [affective] disorder: Secondary | ICD-10-CM | POA: Diagnosis not present

## 2023-02-26 DIAGNOSIS — E782 Mixed hyperlipidemia: Secondary | ICD-10-CM

## 2023-02-26 MED ORDER — BUSPIRONE HCL 10 MG PO TABS: 15.0000 mg | ORAL_TABLET | Freq: Two times a day (BID) | ORAL | Status: DC

## 2023-02-26 NOTE — Patient Instructions (Signed)
Please review the attached list of medications and notify my office if there are any errors.   Increase buspirone to 15mg  by taking 1 and 1/2 tablets twice a day. I'll send in a prescription for 15mg  tablets in a few weeks

## 2023-02-26 NOTE — Progress Notes (Signed)
Established patient visit   Patient: Devin Parsons   DOB: [birth year]   53 y.o. Male  MRN: [id number] Visit Date: 02/26/2023  Today's healthcare provider: Mila Merry, MD    Subjective    HPI HPI     Anxiety    Additional comments: Patient hesitantly states he is doing a little better.  States he has good days and bad.  He was last seen in December and no changes in the medication order was made.  However, he was not taking his Buspirone BID as ordered, only daily.  He has since been taking it correctly.  He has also started therapy.        Gastroesophageal Reflux    Additional comments: Patient is doing well and currently not taking any medication for this.  He does not want to take it off of his list however in the event he needs to go back on it.       Last edited by Adline Peals, CMA on 02/26/2023  8:28 AM.      Follow up anxiety and stress since starting to take buspirone 10mg  twice a day consistently over the last 2 months. Is also seeing therapist every two weeks which is helpful. He is still having a little trouble sleeping. Is tolerating buspirone well but still having moderate amt of anxiety.   Is also due to follow up lipids since cholesterol was high in October. Is working on eating healthier.   Lab Results  Component Value Date   CHOL 218 (H) 10/25/2022   HDL 30 (L) 10/25/2022   LDLCALC 149 (H) 10/25/2022   TRIG 214 (H) 10/25/2022   CHOLHDL 7.3 (H) 10/25/2022   The 10-year ASCVD risk score (Arnett DK, et al., 2019) is: 10.2%   Medications: Outpatient Medications Prior to Visit  Medication Sig   amLODipine (NORVASC) 5 MG tablet TAKE 1 TABLET(5 MG) BY MOUTH DAILY   benazepril-hydrochlorthiazide (LOTENSIN HCT) 20-12.5 MG tablet TAKE 1 TABLET BY MOUTH DAILY   HYDROcodone-acetaminophen (NORCO/VICODIN) 5-325 MG tablet Take 1 tablet by mouth every 4 (four) hours as needed for moderate pain (pain score 4-6).   ibuprofen (ADVIL,MOTRIN) 200 MG tablet Take  400-600 mg by mouth 2 (two) times daily as needed for headache or mild pain.    meloxicam (MOBIC) 15 MG tablet TAKE 1 TABLET BY MOUTH EVERY DAY AS NEEDED FOR PAIN, REPLACES NABUMETONE   venlafaxine XR (EFFEXOR-XR) 75 MG 24 hr capsule TAKE 1 CAPSULE(75 MG) BY MOUTH DAILY WITH BREAKFAST   busPIRone (BUSPAR) 10 MG tablet Take 1 tablet (10 mg total) by mouth 2 (two) times daily.   famotidine (PEPCID) 20 MG tablet Take 1 tablet (20 mg total) by mouth 2 (two) times daily. (Patient not taking: Reported on 02/26/2023)   hyoscyamine (LEVSIN SL) 0.125 MG SL tablet DISSOLVE 1 TABLET(0.125 MG) UNDER THE TONGUE EVERY 4 HOURS AS NEEDED (Patient not taking: Reported on 02/26/2023)   No facility-administered medications prior to visit.      Objective    BP 121/87 (BP Location: Left Arm, Patient Position: Sitting, Cuff Size: Large)   Pulse 82   Temp 98.3 F (36.8 C) (Oral)   Resp 16   Ht 5\' 11"  (1.803 m)   Wt 241 lb (109.3 kg)   BMI 33.61 kg/m    Physical Exam  General appearance: Mildly obese male, cooperative and in no acute distress Head: Normocephalic, without obvious abnormality, atraumatic Respiratory: Respirations even and unlabored, normal respiratory rate Extremities:  All extremities are intact.  Skin: Skin color, texture, turgor normal. No rashes seen  Psych: Appropriate mood and affect. Neurologic: Mental status: Alert, oriented to person, place, and time, thought content appropriate.   Assessment & Plan     1. Episodic mood disorder (HCC) (Primary) Tolerating buspirone well and started on biweekly therapy sessions. Seeing a bit of improvement.   Titrate up to busPIRone (BUSPAR) 10 MG tablet; Take 1.5 tablets (15 mg total) by mouth 2 (two) times daily.  2. Hyperlipidemia, mixed Working on healthier diet - CBC - Comprehensive metabolic panel - Lipid panel   Return in about 3 months (around 05/26/2023).         Mila Merry, MD  [facility] [phone] (phone) [phone] (fax)  [facility]

## 2023-02-27 ENCOUNTER — Telehealth: Payer: Self-pay

## 2023-02-27 DIAGNOSIS — Z87891 Personal history of nicotine dependence: Secondary | ICD-10-CM

## 2023-02-27 DIAGNOSIS — Z122 Encounter for screening for malignant neoplasm of respiratory organs: Secondary | ICD-10-CM

## 2023-02-27 LAB — COMPREHENSIVE METABOLIC PANEL
ALT: 31 [IU]/L (ref 0–44)
AST: 18 [IU]/L (ref 0–40)
Albumin: 4.6 g/dL (ref 3.8–4.9)
Alkaline Phosphatase: 73 [IU]/L (ref 44–121)
BUN/Creatinine Ratio: 16 (ref 9–20)
BUN: 16 mg/dL (ref 6–24)
Bilirubin Total: 0.3 mg/dL (ref 0.0–1.2)
CO2: 23 mmol/L (ref 20–29)
Calcium: 9.8 mg/dL (ref 8.7–10.2)
Chloride: 103 mmol/L (ref 96–106)
Creatinine, Ser: 1.03 mg/dL (ref 0.76–1.27)
Globulin, Total: 2.7 g/dL (ref 1.5–4.5)
Glucose: 90 mg/dL (ref 70–99)
Potassium: 4.2 mmol/L (ref 3.5–5.2)
Sodium: 140 mmol/L (ref 134–144)
Total Protein: 7.3 g/dL (ref 6.0–8.5)
eGFR: 86 mL/min/{1.73_m2} (ref >59)

## 2023-02-27 LAB — CBC
Hematocrit: 44.4 % (ref 37.5–51.0)
Hemoglobin: 14.7 g/dL (ref 13.0–17.7)
MCH: 29.5 pg (ref 26.6–33.0)
MCHC: 33.1 g/dL (ref 31.5–35.7)
MCV: 89 fL (ref 79–97)
Platelets: 360 10*3/uL (ref 150–450)
RBC: 4.98 x10E6/uL (ref 4.14–5.80)
RDW: 12.8 % (ref 11.6–15.4)
WBC: 8.5 10*3/uL (ref 3.4–10.8)

## 2023-02-27 LAB — LIPID PANEL
Chol/HDL Ratio: 5.6 {ratio} — ABNORMAL HIGH (ref 0.0–5.0)
Cholesterol, Total: 202 mg/dL — ABNORMAL HIGH (ref 100–199)
HDL: 36 mg/dL — ABNORMAL LOW (ref >39)
LDL Chol Calc (NIH): 138 mg/dL — ABNORMAL HIGH (ref 0–99)
Triglycerides: 153 mg/dL — ABNORMAL HIGH (ref 0–149)
VLDL Cholesterol Cal: 28 mg/dL (ref 5–40)

## 2023-02-27 NOTE — Telephone Encounter (Signed)
.  Lung Cancer Screening Narrative/Criteria Questionnaire (Cigarette Smokers Only- No Cigars/Pipes/vapes)   Devin Parsons   SDMV:03/09/2023 at 9:30 am with [name], RN   September 21, 1970   LDCT: 03/16/2023 at 2:30pm at [facility] [place]    53 y.o.   Phone: [phone]  Lung Screening Narrative (confirm age 31-77 yrs Medicare / 50-80 yrs Private pay insurance)   Insurance information:BCBS   Referring Provider:FIsher   This screening involves an initial phone call with a team member from our program. It is called a shared decision making visit. The initial meeting is required by  insurance and Medicare to make sure you understand the program. This appointment takes about 15-20 minutes to complete. You will complete the screening scan at your scheduled date/time.  This scan takes about 5-10 minutes to complete. You can eat and drink normally before and after the scan.  Criteria questions for Lung Cancer Screening:   Are you a current or former smoker? Former Age began smoking: 9   If you are a former smoker, what year did you quit smoking? 2023 (within 15 yrs)   To calculate your smoking history, I need an accurate estimate of how many packs of cigarettes you smoked per day and for how many years. (Not just the number of PPD you are now smoking)   Years smoking 42 x Packs per day 1.5 = Pack years 61   (at least 20 pack yrs)   (Make sure they understand that we need to know how much they have smoked in the past, not just the number of PPD they are smoking now)  Do you have a personal history of cancer?  Yes - (type and when diagnosed - 5 yrs cancer free) melnoma    Do you have a family history of cancer? No  Are you coughing up blood?  No  Have you had unexplained weight loss of 15 lbs or more in the last 6 months? No  It looks like you meet all criteria.  When would be a good time for us  to schedule you for this screening?   Additional information:

## 2023-02-28 ENCOUNTER — Encounter: Payer: Self-pay | Admitting: Acute Care

## 2023-03-01 ENCOUNTER — Other Ambulatory Visit: Payer: Self-pay | Admitting: Family Medicine

## 2023-03-01 ENCOUNTER — Encounter: Payer: Self-pay | Admitting: Family Medicine

## 2023-03-01 DIAGNOSIS — F39 Unspecified mood [affective] disorder: Secondary | ICD-10-CM

## 2023-03-01 MED ORDER — BUSPIRONE HCL 15 MG PO TABS: 15.0000 mg | ORAL_TABLET | Freq: Two times a day (BID) | ORAL | 5 refills | Status: DC

## 2023-03-07 ENCOUNTER — Encounter: Payer: Self-pay | Admitting: Family Medicine

## 2023-03-07 ENCOUNTER — Other Ambulatory Visit: Payer: Self-pay | Admitting: Family Medicine

## 2023-03-07 DIAGNOSIS — M5020 Other cervical disc displacement, unspecified cervical region: Secondary | ICD-10-CM

## 2023-03-07 DIAGNOSIS — G8929 Other chronic pain: Secondary | ICD-10-CM

## 2023-03-08 ENCOUNTER — Other Ambulatory Visit: Payer: Self-pay | Admitting: Family Medicine

## 2023-03-08 DIAGNOSIS — F39 Unspecified mood [affective] disorder: Secondary | ICD-10-CM

## 2023-03-08 MED ORDER — HYDROCODONE-ACETAMINOPHEN 5-325 MG PO TABS: 1.0000 | ORAL_TABLET | ORAL | 0 refills | Status: DC | PRN

## 2023-03-08 MED ORDER — BUSPIRONE HCL 15 MG PO TABS: 15.0000 mg | ORAL_TABLET | Freq: Two times a day (BID) | ORAL | 5 refills | Status: DC

## 2023-03-08 NOTE — Telephone Encounter (Signed)
Medication Refill -  Most Recent Primary Care Visit:  Provider: Malva Limes  Department: [id number]  Visit Type: OFFICE VISIT  Date: 12/25/2022  Medication: busPIRone (BUSPAR) 15 MG tablet  Pt checked with pharmacy and they still do not have the prescription. Pt still has some og the 10mg  but is requesting prescription for 15mg  to be sent in again.  Has the patient contacted their pharmacy? Yes Contact the office.   Is this the correct pharmacy for this prescription? Yes If no, delete pharmacy and type the correct one.  This is the patient's preferred pharmacy:  [id number] - [place], [place] - [address] AT [facility] [address] [place] [address] Phone: [phone] Fax: [phone]  Has the prescription been filled recently? No  Is the patient out of the medication? No  Has the patient been seen for an appointment in the last year OR does the patient have an upcoming appointment? Yes  Can we respond through MyChart? Yes  Agent: Please be advised that Rx refills may take up to 3 business days. We ask that you follow-up with your pharmacy.

## 2023-03-08 NOTE — Telephone Encounter (Signed)
Resending as provider ordered on 03/01/23 #60/5 refills.  Requested Prescriptions  Pending Prescriptions Disp Refills   busPIRone (BUSPAR) 15 MG tablet 60 tablet 5    Sig: Take 1 tablet (15 mg total) by mouth 2 (two) times daily.     Psychiatry: Anxiolytics/Hypnotics - Non-controlled Passed - 03/08/2023  3:42 PM      Passed - Valid encounter within last 12 months    Recent Outpatient Visits           2 months ago Episodic mood disorder [facility])   [place] [facility] Malva Limes, MD   4 months ago Annual physical exam   [facility] Malva L[facility], MD   10 months ago Primary hypertension   [place] Malva Limes, MD   1 year ago SVT (supraventricular tachycardia) [facility])   [place] [facility] Malva Limes, MD   1 year ago Aneurysm of ascending aorta without rupture   [facility] Malva Limes, MD       Future Appointments             In 2 months Fisher, Demetrios Isaacs, MD [facility], [facility]

## 2023-03-09 ENCOUNTER — Ambulatory Visit (INDEPENDENT_AMBULATORY_CARE_PROVIDER_SITE_OTHER): Payer: BC Managed Care – PPO | Admitting: Acute Care

## 2023-03-09 DIAGNOSIS — Z87891 Personal history of nicotine dependence: Secondary | ICD-10-CM | POA: Diagnosis not present

## 2023-03-09 NOTE — Progress Notes (Signed)
 Provider Attestation I agree with the documentation of the Shared Decision Making visit,  smoking cessation counseling if appropriate, and verification or eligibility for lung cancer screening as documented by the RN Nurse Navigator.   Raejean Bullock, MSN, AGACNP-BC [facility] See Amion for personal pager PCCM on call pager [phone]     Virtual Visit via Telephone Note  I connected with Cleda Curly on 03/09/23 at  9:30 AM EST by telephone and verified that I am speaking with the correct person using two identifiers.  Location: Patient: in home Provider: [address] [place], [place], [address]    Shared Decision Making Visit Lung Cancer Screening Program [phone])   Eligibility: Age 53 y.o. Pack Years Smoking History Calculation 38 (# packs/per year x # years smoked) Recent History of coughing up blood  no Unexplained weight loss? no ( >Than 15 pounds within the last 6 months ) Prior History Lung / other cancer yes- melanoma (Diagnosis within the last 5 years already requiring surveillance chest CT Scans). Smoking Status Former Smoker Former Smokers: Years since quit: 2 years  Quit Date: 09-23-21  Visit Components: Discussion included one or more decision making aids. yes Discussion included risk/benefits of screening. yes Discussion included potential follow up diagnostic testing for abnormal scans. yes Discussion included meaning and risk of over diagnosis. yes Discussion included meaning and risk of False Positives. yes Discussion included meaning of total radiation exposure. yes  Counseling Included: Importance of adherence to annual lung cancer LDCT screening. yes Impact of comorbidities on ability to participate in the program. yes Ability and willingness to under diagnostic treatment. yes  Smoking Cessation Counseling: Current Smokers:  Discussed importance of smoking cessation. yes Information about tobacco  cessation classes and interventions provided to patient. yes Patient provided with "ticket" for LDCT Scan. yes Symptomatic Patient. no  Counseling NA Diagnosis Code: Tobacco Use Z72.0 Asymptomatic Patient yes  Counseling (Intermediate counseling: > three minutes counseling) Q6578 Former Smokers:  Discussed the importance of maintaining cigarette abstinence. yes Diagnosis Code: Personal History of Nicotine  Dependence. I69.629 Information about tobacco cessation classes and interventions provided to patient. Yes Patient provided with "ticket" for LDCT Scan. yes Written Order for Lung Cancer Screening with LDCT placed in Epic. Yes (CT Chest Lung Cancer Screening Low Dose W/O CM) [id number] Z12.2-Screening of respiratory organs Z87.891-Personal history of nicotine  dependence   Valentin Gaskins, RN 03/09/23

## 2023-03-09 NOTE — Patient Instructions (Signed)

## 2023-03-16 ENCOUNTER — Ambulatory Visit
Admission: RE | Admit: 2023-03-16 | Discharge: 2023-03-16 | Disposition: A | Discharge status: Home / Self Care | Payer: BC Managed Care – PPO | Source: Ambulatory Visit | Attending: Acute Care | Admitting: Acute Care

## 2023-03-16 DIAGNOSIS — Z87891 Personal history of nicotine dependence: Secondary | ICD-10-CM | POA: Diagnosis not present

## 2023-03-16 DIAGNOSIS — Z122 Encounter for screening for malignant neoplasm of respiratory organs: Secondary | ICD-10-CM

## 2023-03-21 DIAGNOSIS — F331 Major depressive disorder, recurrent, moderate: Secondary | ICD-10-CM | POA: Diagnosis not present

## 2023-04-02 ENCOUNTER — Other Ambulatory Visit: Payer: Self-pay | Admitting: Family Medicine

## 2023-04-02 ENCOUNTER — Telehealth: Admitting: Physician Assistant

## 2023-04-02 DIAGNOSIS — M545 Low back pain, unspecified: Secondary | ICD-10-CM

## 2023-04-02 DIAGNOSIS — J069 Acute upper respiratory infection, unspecified: Secondary | ICD-10-CM | POA: Diagnosis not present

## 2023-04-02 DIAGNOSIS — M5020 Other cervical disc displacement, unspecified cervical region: Secondary | ICD-10-CM

## 2023-04-02 DIAGNOSIS — B9689 Other specified bacterial agents as the cause of diseases classified elsewhere: Secondary | ICD-10-CM | POA: Diagnosis not present

## 2023-04-02 MED ORDER — AMOXICILLIN-POT CLAVULANATE 875-125 MG PO TABS: 1.0000 | ORAL_TABLET | Freq: Two times a day (BID) | ORAL | 0 refills | Status: DC

## 2023-04-02 MED ORDER — PROMETHAZINE-DM 6.25-15 MG/5ML PO SYRP: 5.0000 mL | ORAL_SOLUTION | Freq: Four times a day (QID) | ORAL | 0 refills | Status: DC | PRN

## 2023-04-02 MED ORDER — ALBUTEROL SULFATE HFA 108 (90 BASE) MCG/ACT IN AERS: 1.0000 | INHALATION_SPRAY | Freq: Four times a day (QID) | RESPIRATORY_TRACT | 0 refills | Status: DC | PRN

## 2023-04-02 NOTE — Patient Instructions (Signed)
 Devin Parsons, thank you for joining Devin Loveless, PA-C for today's virtual visit.  While this provider is not your primary care provider (PCP), if your PCP is located in our provider database this encounter information will be shared with them immediately following your visit.   A [facility] MyChart account gives you access to today's visit and all your visits, tests, and labs performed at [facility] " click here if you don't have a [facility] MyChart account or go to [email or web address]  Consent: (Patient) Devin Parsons provided verbal consent for this virtual visit at the beginning of the encounter.  Current Medications:  Current Outpatient Medications:    albuterol (VENTOLIN HFA) 108 (90 Base) MCG/ACT inhaler, Inhale 1-2 puffs into the lungs every 6 (six) hours as needed., Disp: 8 g, Rfl: 0   amoxicillin-clavulanate (AUGMENTIN) 875-125 MG tablet, Take 1 tablet by mouth 2 (two) times daily., Disp: 20 tablet, Rfl: 0   promethazine-dextromethorphan (PROMETHAZINE-DM) 6.25-15 MG/5ML syrup, Take 5 mLs by mouth 4 (four) times daily as needed., Disp: 118 mL, Rfl: 0   amLODipine (NORVASC) 5 MG tablet, TAKE 1 TABLET(5 MG) BY MOUTH DAILY, Disp: 90 tablet, Rfl: 1   benazepril-hydrochlorthiazide (LOTENSIN HCT) 20-12.5 MG tablet, TAKE 1 TABLET BY MOUTH DAILY, Disp: 90 tablet, Rfl: 1   busPIRone (BUSPAR) 15 MG tablet, Take 1 tablet (15 mg total) by mouth 2 (two) times daily., Disp: 60 tablet, Rfl: 5   famotidine (PEPCID) 20 MG tablet, Take 1 tablet (20 mg total) by mouth 2 (two) times daily. (Patient not taking: Reported on 02/26/2023), Disp: 60 tablet, Rfl: 0   HYDROcodone-acetaminophen (NORCO/VICODIN) 5-325 MG tablet, Take 1 tablet by mouth every 4 (four) hours as needed for moderate pain (pain score 4-6)., Disp: 150 tablet, Rfl: 0   hyoscyamine (LEVSIN SL) 0.125 MG SL tablet, DISSOLVE 1 TABLET(0.125 MG) UNDER THE TONGUE EVERY 4 HOURS AS NEEDED (Patient not taking:  Reported on 02/26/2023), Disp: 15 tablet, Rfl: 0   ibuprofen (ADVIL,MOTRIN) 200 MG tablet, Take 400-600 mg by mouth 2 (two) times daily as needed for headache or mild pain. , Disp: , Rfl:    meloxicam (MOBIC) 15 MG tablet, TAKE 1 TABLET BY MOUTH EVERY DAY AS NEEDED FOR PAIN, REPLACES NABUMETONE, Disp: 30 tablet, Rfl: 5   venlafaxine XR (EFFEXOR-XR) 75 MG 24 hr capsule, TAKE 1 CAPSULE(75 MG) BY MOUTH DAILY WITH BREAKFAST, Disp: 90 capsule, Rfl: 0   Medications ordered in this encounter:  Meds ordered this encounter  Medications   amoxicillin-clavulanate (AUGMENTIN) 875-125 MG tablet    Sig: Take 1 tablet by mouth 2 (two) times daily.    Dispense:  20 tablet    Refill:  0    Supervising Provider:   Merrilee Jansky [[id number]]   albuterol (VENTOLIN HFA) 108 (90 Base) MCG/ACT inhaler    Sig: Inhale 1-2 puffs into the lungs every 6 (six) hours as needed.    Dispense:  8 g    Refill:  0    Supervising Provider:   Merrilee Jansky [[id number]]   promethazine-dextromethorphan (PROMETHAZINE-DM) 6.25-15 MG/5ML syrup    Sig: Take 5 mLs by mouth 4 (four) times daily as needed.    Dispense:  118 mL    Refill:  0    Supervising Provider:   Merrilee Jansky [[id number]]     *If you need refills on other medications prior to your next appointment, please contact your pharmacy*  Follow-Up: Call back or seek an in-person evaluation if  the symptoms worsen or if the condition fails to improve as anticipated.  [facility] [phone]  Other Instructions Upper Respiratory Infection, Adult An upper respiratory infection (URI) is a common viral infection of the nose, throat, and upper air passages that lead to the lungs. The most common type of URI is the common cold. URIs usually get better on their own, without medical treatment. What are the causes? A URI is caused by a virus. You may catch a virus by: Breathing in droplets from an infected person's cough or sneeze. Touching something that  has been exposed to the virus (is contaminated) and then touching your mouth, nose, or eyes. What increases the risk? You are more likely to get a URI if: You are very young or very old. You have close contact with others, such as at work, school, or a health care facility. You smoke. You have long-term (chronic) heart or lung disease. You have a weakened disease-fighting system (immune system). You have nasal allergies or asthma. You are experiencing a lot of stress. You have poor nutrition. What are the signs or symptoms? A URI usually involves some of the following symptoms: Runny or stuffy (congested) nose. Cough. Sneezing. Sore throat. Headache. Fatigue. Fever. Loss of appetite. Pain in your forehead, behind your eyes, and over your cheekbones (sinus pain). Muscle aches. Redness or irritation of the eyes. Pressure in the ears or face. How is this diagnosed? This condition may be diagnosed based on your medical history and symptoms, and a physical exam. Your health care provider may use a swab to take a mucus sample from your nose (nasal swab). This sample can be tested to determine what virus is causing the illness. How is this treated? URIs usually get better on their own within 7-10 days. Medicines cannot cure URIs, but your health care provider may recommend certain medicines to help relieve symptoms, such as: Over-the-counter cold medicines. Cough suppressants. Coughing is a type of defense against infection that helps to clear the respiratory system, so take these medicines only as recommended by your health care provider. Fever-reducing medicines. Follow these instructions at home: Activity Rest as needed. If you have a fever, stay home from work or school until your fever is gone or until your health care provider says your URI cannot spread to other people (is no longer contagious). Your health care provider may have you wear a face mask to prevent your infection from  spreading. Relieving symptoms Gargle with a mixture of salt and water 3-4 times a day or as needed. To make salt water, completely dissolve -1 tsp (3-6 g) of salt in 1 cup (237 mL) of warm water. Use a cool-mist humidifier to add moisture to the air. This can help you breathe more easily. Eating and drinking  Drink enough fluid to keep your urine pale yellow. Eat soups and other clear broths. General instructions  Take over-the-counter and prescription medicines only as told by your health care provider. These include cold medicines, fever reducers, and cough suppressants. Do not use any products that contain nicotine or tobacco. These products include cigarettes, chewing tobacco, and vaping devices, such as e-cigarettes. If you need help quitting, ask your health care provider. Stay away from secondhand smoke. Stay up to date on all immunizations, including the yearly (annual) flu vaccine. Keep all follow-up visits. This is important. How to prevent the spread of infection to others URIs can be contagious. To prevent the infection from spreading: Wash your hands  with soap and water for at least 20 seconds. If soap and water are not available, use hand sanitizer. Avoid touching your mouth, face, eyes, or nose. Cough or sneeze into a tissue or your sleeve or elbow instead of into your hand or into the air.  Contact a health care provider if: You are getting worse instead of better. You have a fever or chills. Your mucus is brown or red. You have yellow or brown discharge coming from your nose. You have pain in your face, especially when you bend forward. You have swollen neck glands. You have pain while swallowing. You have white areas in the back of your throat. Get help right away if: You have shortness of breath that gets worse. You have severe or persistent: Headache. Ear pain. Sinus pain. Chest pain. You have chronic lung disease along with any of the following: Making  high-pitched whistling sounds when you breathe, most often when you breathe out (wheezing). Prolonged cough (more than 14 days). Coughing up blood. A change in your usual mucus. You have a stiff neck. You have changes in your: Vision. Hearing. Thinking. Mood. These symptoms may be an emergency. Get help right away. Call 911. Do not wait to see if the symptoms will go away. Do not drive yourself to the hospital. Summary An upper respiratory infection (URI) is a common infection of the nose, throat, and upper air passages that lead to the lungs. A URI is caused by a virus. URIs usually get better on their own within 7-10 days. Medicines cannot cure URIs, but your health care provider may recommend certain medicines to help relieve symptoms. This information is not intended to replace advice given to you by your health care provider. Make sure you discuss any questions you have with your health care provider. Document Revised: 08/11/2020 Document Reviewed: 08/11/2020 Elsevier Patient Education  2024 Elsevier Inc[employer]   If you have been instructed to have an in-person evaluation today at a local Urgent Care facility, please use the link below. It will take you to a list of all of our available [facility], including address, phone number and hours of operation. Please do not delay care.  [facility]  If you or a family member do not have a primary care provider, use the link below to schedule a visit and establish care. When you choose a [facility] primary care physician or advanced practice provider, you gain a long-term partner in health. Find a Primary Care Provider  Learn more about [facility] in-office and virtual care options: [facility] - Get Care Now

## 2023-04-02 NOTE — Progress Notes (Signed)
 Virtual Visit Consent   Devin Parsons, you are scheduled for a virtual visit with a [facility] provider today. Just as with appointments in the office, your consent must be obtained to participate. Your consent will be active for this visit and any virtual visit you may have with one of our providers in the next 365 days. If you have a MyChart account, a copy of this consent can be sent to you electronically.  As this is a virtual visit, video technology does not allow for your provider to perform a traditional examination. This may limit your provider's ability to fully assess your condition. If your provider identifies any concerns that need to be evaluated in person or the need to arrange testing (such as labs, EKG, etc.), we will make arrangements to do so. Although advances in technology are sophisticated, we cannot ensure that it will always work on either your end or our end. If the connection with a video visit is poor, the visit may have to be switched to a telephone visit. With either a video or telephone visit, we are not always able to ensure that we have a secure connection.  By engaging in this virtual visit, you consent to the provision of healthcare and authorize for your insurance to be billed (if applicable) for the services provided during this visit. Depending on your insurance coverage, you may receive a charge related to this service.  I need to obtain your verbal consent now. Are you willing to proceed with your visit today? Devin Parsons has provided verbal consent on 04/02/2023 for a virtual visit (video or telephone). [name], PA-C  Date: 04/02/2023 11:51 AM   Virtual Visit via Video Note   I, [name], connected with  Devin Parsons  ([id number], [birth year]) on 04/02/23 at 11:45 AM EDT by a video-enabled telemedicine application and verified that I am speaking with the correct person using two identifiers.  Location: Patient: Virtual Visit  Location Patient: Home Provider: Virtual Visit Location Provider: Home Office   I discussed the limitations of evaluation and management by telemedicine and the availability of in person appointments. The patient expressed understanding and agreed to proceed.    History of Present Illness: Devin Parsons is a 53 y.o. who identifies as a male who was assigned male at birth, and is being seen today for URI symptoms.  HPI: URI  This is a new problem. The current episode started in the past 7 days. The problem has been gradually worsening. Maximum temperature: subjective fevers. Associated symptoms include chest pain (tightness), congestion, coughing, headaches, a plugged ear sensation (tinnitus), rhinorrhea (and post nasal drainage), sinus pain (mild), a sore throat and wheezing (mild). Pertinent negatives include no diarrhea, ear pain, nausea, swollen glands or vomiting. Treatments tried: dayquil/nyquil. The treatment provided no relief.     Problems:  Patient Active Problem List   Diagnosis Date Noted   Stopped smoking with greater than 20 pack year history 04/25/2022   Aneurysm of ascending aorta (HCC) 02/21/2018   History of basal cell carcinoma (BCC) 10/09/2017   Hypertension 09/21/2015   [facility] hemangioma 11/19/2014   Back pain, chronic 11/19/2014   Hyperlipidemia, mixed 11/19/2014   Vestibular dizziness 11/19/2014   Tobacco abuse    SVT (supraventricular tachycardia) (HCC)    Syncope, near 08/13/2014   Episodic mood disorder (HCC) 11/30/2007   Displacement of cervical intervertebral disc without myelopathy 04/22/2007    Allergies: No Known Allergies Medications:  Current Outpatient  Medications:    albuterol (VENTOLIN HFA) 108 (90 Base) MCG/ACT inhaler, Inhale 1-2 puffs into the lungs every 6 (six) hours as needed., Disp: 8 g, Rfl: 0   amLODipine (NORVASC) 5 MG tablet, TAKE 1 TABLET(5 MG) BY MOUTH DAILY, Disp: 90 tablet, Rfl: 1   amoxicillin-clavulanate (AUGMENTIN) 875-125 MG  tablet, Take 1 tablet by mouth 2 (two) times daily., Disp: 20 tablet, Rfl: 0   benazepril-hydrochlorthiazide (LOTENSIN HCT) 20-12.5 MG tablet, TAKE 1 TABLET BY MOUTH DAILY, Disp: 90 tablet, Rfl: 1   busPIRone (BUSPAR) 15 MG tablet, Take 1 tablet (15 mg total) by mouth 2 (two) times daily., Disp: 60 tablet, Rfl: 5   famotidine (PEPCID) 20 MG tablet, Take 1 tablet (20 mg total) by mouth 2 (two) times daily. (Patient not taking: Reported on 02/26/2023), Disp: 60 tablet, Rfl: 0   HYDROcodone-acetaminophen (NORCO/VICODIN) 5-325 MG tablet, Take 1 tablet by mouth every 4 (four) hours as needed for moderate pain (pain score 4-6)., Disp: 150 tablet, Rfl: 0   hyoscyamine (LEVSIN SL) 0.125 MG SL tablet, DISSOLVE 1 TABLET(0.125 MG) UNDER THE TONGUE EVERY 4 HOURS AS NEEDED (Patient not taking: Reported on 02/26/2023), Disp: 15 tablet, Rfl: 0   ibuprofen (ADVIL,MOTRIN) 200 MG tablet, Take 400-600 mg by mouth 2 (two) times daily as needed for headache or mild pain. , Disp: , Rfl:    meloxicam (MOBIC) 15 MG tablet, TAKE 1 TABLET BY MOUTH EVERY DAY AS NEEDED FOR PAIN, REPLACES NABUMETONE, Disp: 30 tablet, Rfl: 5   promethazine-dextromethorphan (PROMETHAZINE-DM) 6.25-15 MG/5ML syrup, Take 5 mLs by mouth 4 (four) times daily as needed., Disp: 118 mL, Rfl: 0   venlafaxine XR (EFFEXOR-XR) 75 MG 24 hr capsule, TAKE 1 CAPSULE(75 MG) BY MOUTH DAILY WITH BREAKFAST, Disp: 90 capsule, Rfl: 0  Observations/Objective: Patient is well-developed, well-nourished in no acute distress.  Resting comfortably at home.  Head is normocephalic, atraumatic.  No labored breathing.  Speech is clear and coherent with logical content.  Patient is alert and oriented at baseline.    Assessment and Plan: 1. Bacterial upper respiratory infection (Primary) - amoxicillin-clavulanate (AUGMENTIN) 875-125 MG tablet; Take 1 tablet by mouth 2 (two) times daily.  Dispense: 20 tablet; Refill: 0 - albuterol (VENTOLIN HFA) 108 (90 Base) MCG/ACT inhaler;  Inhale 1-2 puffs into the lungs every 6 (six) hours as needed.  Dispense: 8 g; Refill: 0 - promethazine-dextromethorphan (PROMETHAZINE-DM) 6.25-15 MG/5ML syrup; Take 5 mLs by mouth 4 (four) times daily as needed.  Dispense: 118 mL; Refill: 0  - Worsening over a week despite OTC medications - Will treat with Augmentin and Promethazine DM - Albuterol for wheezing - Can continue Mucinex (PLAIN) - Push fluids.  - Rest.  - Steam and humidifier can help - Seek in person evaluation if worsening or symptoms fail to improve    Follow Up Instructions: I discussed the assessment and treatment plan with the patient. The patient was provided an opportunity to ask questions and all were answered. The patient agreed with the plan and demonstrated an understanding of the instructions.  A copy of instructions were sent to the patient via MyChart unless otherwise noted below.    The patient was advised to call back or seek an in-person evaluation if the symptoms worsen or if the condition fails to improve as anticipated.    [name], PA-C

## 2023-04-03 MED ORDER — HYDROCODONE-ACETAMINOPHEN 5-325 MG PO TABS: 1.0000 | ORAL_TABLET | ORAL | 0 refills | Status: DC | PRN

## 2023-04-04 ENCOUNTER — Telehealth: Admitting: Physician Assistant

## 2023-04-04 ENCOUNTER — Ambulatory Visit
Admission: RE | Admit: 2023-04-04 | Discharge: 2023-04-04 | Disposition: A | Discharge status: Home / Self Care | Source: Ambulatory Visit | Attending: Emergency Medicine | Admitting: Emergency Medicine

## 2023-04-04 VITALS — BP 127/89 | HR 92 | Temp 98.6°F | Resp 18

## 2023-04-04 DIAGNOSIS — R051 Acute cough: Secondary | ICD-10-CM

## 2023-04-04 DIAGNOSIS — J069 Acute upper respiratory infection, unspecified: Secondary | ICD-10-CM

## 2023-04-04 DIAGNOSIS — B9689 Other specified bacterial agents as the cause of diseases classified elsewhere: Secondary | ICD-10-CM

## 2023-04-04 MED ORDER — PREDNISONE 10 MG (21) PO TBPK: ORAL_TABLET | Freq: Every day | ORAL | 0 refills | Status: DC

## 2023-04-04 NOTE — ED Triage Notes (Signed)
 Patient to Urgent Care with complaints of sore throat/ nasal congestion/ cough/  body aches/ shortness of breath that started Saturday.   Meds: Virtual visit on Monday- taking abx prescribed but not feeling any better. Also taking albuterol/ promethazine DM. Denies any recent fevers.

## 2023-04-04 NOTE — ED Provider Notes (Signed)
 Devin Parsons    CSN: [id number] Arrival date & time: 04/04/23  1530      History   Chief Complaint Chief Complaint  Patient presents with   Sore Throat    Entered by patient    HPI Devin Parsons is a 53 y.o. male.  Patient presents with 4-day history of sore throat, congestion, cough, shortness of breath, body aches.  No fever.  He is currently on Augmentin, albuterol inhaler, Promethazine DM; these were prescribed on a telehealth visit on 04/02/2023.  Patient had a telehealth visit again today and was instructed to be seen in person.  The history is provided by the patient and medical records.    Past Medical History:  Diagnosis Date   COVID-19 12/24/2018   By patient reports tested positive at [facility]   History of chicken pox    SVT (supraventricular tachycardia) (HCC)    a. echo 07/2014: EF 50-55%, no RWMA, GR1DD, PASP nl   Syncope     Patient Active Problem List   Diagnosis Date Noted   Stopped smoking with greater than 20 pack year history 04/25/2022   Aneurysm of ascending aorta (HCC) 02/21/2018   History of basal cell carcinoma (BCC) 10/09/2017   Hypertension 09/21/2015   [facility] hemangioma 11/19/2014   Back pain, chronic 11/19/2014   Hyperlipidemia, mixed 11/19/2014   Vestibular dizziness 11/19/2014   Tobacco abuse    SVT (supraventricular tachycardia) (HCC)    Syncope, near 08/13/2014   Episodic mood disorder (HCC) 11/30/2007   Displacement of cervical intervertebral disc without myelopathy 04/22/2007    Past Surgical History:  Procedure Laterality Date   FRACTURE SURGERY     KNEE SURGERY Left    Arthroscopy surgery performed   MOHS SURGERY Right 10/08/2017   [place]head Dr. Frazier Butt   MRI OF Lumbar spine  06/14/2004   Results: Herniated disc with facet reaction L5/S1. Done at [facility]   TONSILLECTOMY AND ADENOIDECTOMY         Home Medications    Prior to Admission medications   Medication Sig Start Date End Date  Taking? Authorizing Provider  predniSONE (STERAPRED UNI-PAK 21 TAB) 10 MG (21) TBPK tablet Take by mouth daily. As directed 04/04/23  Yes Mickie Bail, NP  albuterol (VENTOLIN HFA) 108 (90 Base) MCG/ACT inhaler Inhale 1-2 puffs into the lungs every 6 (six) hours as needed. 04/02/23   [name], PA-C  amLODipine (NORVASC) 5 MG tablet TAKE 1 TABLET(5 MG) BY MOUTH DAILY 11/24/22   Malva Limes, MD  amoxicillin-clavulanate (AUGMENTIN) 875-125 MG tablet Take 1 tablet by mouth 2 (two) times daily. 04/02/23   [name], PA-C  benazepril-hydrochlorthiazide (LOTENSIN HCT) 20-12.5 MG tablet TAKE 1 TABLET BY MOUTH DAILY 01/09/23   Malva Limes, MD  busPIRone (BUSPAR) 15 MG tablet Take 1 tablet (15 mg total) by mouth 2 (two) times daily. 03/08/23   Malva Limes, MD  famotidine (PEPCID) 20 MG tablet Take 1 tablet (20 mg total) by mouth 2 (two) times daily. Patient not taking: Reported on 02/26/2023 04/16/19   Sharman Cheek, MD  HYDROcodone-acetaminophen (NORCO/VICODIN) 5-325 MG tablet Take 1 tablet by mouth every 4 (four) hours as needed for moderate pain (pain score 4-6). 04/03/23   Malva Limes, MD  hyoscyamine (LEVSIN SL) 0.125 MG SL tablet DISSOLVE 1 TABLET(0.125 MG) UNDER THE TONGUE EVERY 4 HOURS AS NEEDED Patient not taking: Reported on 02/26/2023 04/16/19   Flinchum, Eula Fried, FNP  ibuprofen (  ADVIL,MOTRIN) 200 MG tablet Take 400-600 mg by mouth 2 (two) times daily as needed for headache or mild pain.     [provider]  meloxicam (MOBIC) 15 MG tablet TAKE 1 TABLET BY MOUTH EVERY DAY AS NEEDED FOR PAIN, REPLACES NABUMETONE 11/07/20   Malva Limes, MD  promethazine-dextromethorphan (PROMETHAZINE-DM) 6.25-15 MG/5ML syrup Take 5 mLs by mouth 4 (four) times daily as needed. 04/02/23   [name], PA-C  venlafaxine XR (EFFEXOR-XR) 75 MG 24 hr capsule TAKE 1 CAPSULE(75 MG) BY MOUTH DAILY WITH BREAKFAST 09/08/22   Malva Limes, MD    Family  History Family History  Problem Relation Age of Onset   Diabetes Mother     Social History Social History   Tobacco Use   Smoking status: Former    Current packs/day: 0.00    Types: Cigarettes    Start date: 09/24/1990    Quit date: 09/23/2021    Years since quitting: 1.5   Smokeless tobacco: Never   Tobacco comments:    Previously smoked a pack a day since he was teenager until he was in his 59s.   Vaping Use   Vaping status: Every Day   Start date: 09/23/2021  Substance Use Topics   Alcohol use: No    Alcohol/week: 0.0 standard drinks of alcohol    Comment: formerly a heavy drinker in high school   Drug use: No     Allergies   Patient has no known allergies.   Review of Systems Review of Systems  Constitutional:  Negative for chills and fever.  HENT:  Positive for congestion and sore throat. Negative for ear pain.   Respiratory:  Positive for cough and shortness of breath.      Physical Exam Triage Vital Signs ED Triage Vitals [04/04/23 1550]  Encounter Vitals Group     BP 127/89     Systolic BP Percentile      Diastolic BP Percentile      Pulse Rate 92     Resp 18     Temp 98.6 F (37 C)     Temp src      SpO2 94 %     Weight      Height      Head Circumference      Peak Flow      Pain Score      Pain Loc      Pain Education      Exclude from Growth Chart    No data found.  Updated Vital Signs BP 127/89   Pulse 92   Temp 98.6 F (37 C)   Resp 18   SpO2 94%   Visual Acuity Right Eye Distance:   Left Eye Distance:   Bilateral Distance:    Right Eye Near:   Left Eye Near:    Bilateral Near:     Physical Exam Constitutional:      General: He is not in acute distress. HENT:     Right Ear: Tympanic membrane normal.     Left Ear: Tympanic membrane normal.     Nose: Nose normal.     Mouth/Throat:     Mouth: Mucous membranes are moist.     Pharynx: Oropharynx is clear.  Cardiovascular:     Rate and Rhythm: Normal rate and regular  rhythm.     Heart sounds: Normal heart sounds.  Pulmonary:     Effort: Pulmonary effort is normal. No respiratory distress.  Breath sounds: Normal breath sounds.  Neurological:     Mental Status: He is alert.      UC Treatments / Results  Labs (all labs ordered are listed, but only abnormal results are displayed) Labs Reviewed - No data to display  EKG   Radiology No results found.  Procedures Procedures (including critical care time)  Medications Ordered in UC Medications - No data to display  Initial Impression / Assessment and Plan / UC Course  I have reviewed the triage vital signs and the nursing notes.  Pertinent labs & imaging results that were available during my care of the patient were reviewed by me and considered in my medical decision making (see chart for details).    Cough, URI.  Patient is currently on Augmentin and an albuterol inhaler.  Adding a prednisone taper.  Instructed him to follow-up with his PCP.  Education provided on cough and URI.  ED precautions given.  Work note provided per patient request.  Patient agrees to plan of care.  Final Clinical Impressions(s) / UC Diagnoses   Final diagnoses:  Acute cough  Acute upper respiratory infection     Discharge Instructions      Take the prednisone as directed.  Continue taking the Augmentin and using the albuterol inhaler as directed.  Follow-up with your primary care provider.     ED Prescriptions     Medication Sig Dispense Auth. Provider   predniSONE (STERAPRED UNI-PAK 21 TAB) 10 MG (21) TBPK tablet Take by mouth daily. As directed 21 tablet Mickie Bail, NP      PDMP not reviewed this encounter.   Mickie Bail, NP 04/04/23 1622

## 2023-04-04 NOTE — Discharge Instructions (Signed)
 Take the prednisone as directed.  Continue taking the Augmentin and using the albuterol inhaler as directed.  Follow-up with your primary care provider.

## 2023-04-04 NOTE — Progress Notes (Signed)
 If you are failing the 1st line treatment, we do highly recommend for you to be evaluated in person for appropriate treatment.  NOTE: There will be NO CHARGE for this E-Visit   If you are having a true medical emergency, please call 911.     For an urgent face to face visit, [facility] has multiple urgent care centers for your convenience.  Click the link below for the full list of locations and hours, walk-in wait times, appointment scheduling options and driving directions:  Urgent Care - [place], [place], [place], [place], [place], [place]  [place]     Your MyChart E-visit questionnaire answers were reviewed by a board certified advanced clinical practitioner to complete your personal care plan based on your specific symptoms.    Thank you for using e-Visits.   I have spent 5 minutes in review of e-visit questionnaire, review and updating patient chart, medical decision making and response to patient.   [name], PA-C

## 2023-04-09 ENCOUNTER — Telehealth: Payer: Self-pay

## 2023-04-09 NOTE — Telephone Encounter (Signed)
 LVM to review CT results. Last CTA-11/2021, aneursym is monitored by Dr. Sherrie Mustache. No stain therapy noted. Marland Kitchen  IMPRESSION: 1. Lung-RADS 1, negative. Continue annual screening with low-dose chest CT without contrast in 12 months. 2. Ascending thoracic aortic 4.6 cm aneurysm. Ascending thoracic aortic aneurysm. Recommend semi-annual imaging followup by CTA or MRA and referral to cardiothoracic surgery if not already obtained. This recommendation follows 2010 ACCF/AHA/AATS/ACR/ASA/SCA/SCAI/SIR/STS/SVM Guidelines for the Diagnosis and Management of Patients With Thoracic Aortic Disease. Circulation. 2010; 121: E952-W413. Aortic aneurysm NOS (ICD10-I71.9) 3. Aortic Atherosclerosis (ICD10-I70.0) and Emphysema (ICD10-J43.9).

## 2023-04-10 ENCOUNTER — Other Ambulatory Visit: Payer: Self-pay

## 2023-04-10 DIAGNOSIS — Z122 Encounter for screening for malignant neoplasm of respiratory organs: Secondary | ICD-10-CM

## 2023-04-10 DIAGNOSIS — Z87891 Personal history of nicotine dependence: Secondary | ICD-10-CM

## 2023-04-27 ENCOUNTER — Other Ambulatory Visit: Payer: Self-pay | Admitting: Family Medicine

## 2023-04-27 DIAGNOSIS — M545 Low back pain, unspecified: Secondary | ICD-10-CM

## 2023-04-27 DIAGNOSIS — M5020 Other cervical disc displacement, unspecified cervical region: Secondary | ICD-10-CM

## 2023-04-29 MED ORDER — HYDROCODONE-ACETAMINOPHEN 5-325 MG PO TABS
1.0000 | ORAL_TABLET | ORAL | 0 refills | Status: DC | PRN
Start: 2023-04-29 — End: 2023-05-24

## 2023-05-24 ENCOUNTER — Other Ambulatory Visit: Payer: Self-pay | Admitting: Family Medicine

## 2023-05-24 DIAGNOSIS — I10 Essential (primary) hypertension: Secondary | ICD-10-CM

## 2023-05-24 DIAGNOSIS — G8929 Other chronic pain: Secondary | ICD-10-CM

## 2023-05-24 DIAGNOSIS — M5020 Other cervical disc displacement, unspecified cervical region: Secondary | ICD-10-CM

## 2023-05-25 ENCOUNTER — Other Ambulatory Visit: Payer: Self-pay | Admitting: Family Medicine

## 2023-05-25 DIAGNOSIS — M5020 Other cervical disc displacement, unspecified cervical region: Secondary | ICD-10-CM

## 2023-05-25 DIAGNOSIS — M545 Low back pain, unspecified: Secondary | ICD-10-CM

## 2023-05-25 MED ORDER — HYDROCODONE-ACETAMINOPHEN 5-325 MG PO TABS
1.0000 | ORAL_TABLET | ORAL | 0 refills | Status: DC | PRN
Start: 2023-05-25 — End: 2023-06-19

## 2023-05-25 NOTE — Telephone Encounter (Signed)
 Copied from CRM [id number]. Topic: Clinical - Medication Refill >> May 25, 2023 11:34 AM Rosaria Common wrote: Most Recent Primary Care Visit:  Provider: Lamon Pillow  Department: [facility]  Visit Type: OFFICE VISIT  Date: 02/26/2023  Medication: HYDROcodone -acetaminophen  (NORCO/VICODIN) 5-325 MG tablet  Has the patient contacted their pharmacy? Yes (Agent: If no, request that the patient contact the pharmacy for the refill. If patient does not wish to contact the pharmacy document the reason why and proceed with request.) (Agent: If yes, when and what did the pharmacy advise?)  Is this the correct pharmacy for this prescription? Yes If no, delete pharmacy and type the correct one.  This is the patient's preferred pharmacy:  [id number] - [place], [place] - [address] AT [facility] OF [address] [place] [address] Phone: [phone] Fax: [phone]     Has the prescription been filled recently? Yes  Is the patient out of the medication? No  Has the patient been seen for an appointment in the last year OR does the patient have an upcoming appointment? Yes  Can we respond through MyChart? Yes  Agent: Please be advised that Rx refills may take up to 3 business days. We ask that you follow-up with your pharmacy.

## 2023-05-26 NOTE — Telephone Encounter (Signed)
 Requested medication (s) are due for refill today: Yes  Requested medication (s) are on the active medication list: Yes  Last refill:  05/25/23  Future visit scheduled: Yes  Notes to clinic:  Unable to refill per protocol, cannot delegate.      Requested Prescriptions  Pending Prescriptions Disp Refills   HYDROcodone -acetaminophen  (NORCO/VICODIN) 5-325 MG tablet 150 tablet 0    Sig: Take 1 tablet by mouth every 4 (four) hours as needed for moderate pain (pain score 4-6).     Not Delegated - Analgesics:  Opioid Agonist Combinations Failed - 05/26/2023  1:04 AM      Failed - This refill cannot be delegated      Failed - Urine Drug Screen completed in last 360 days      Passed - Valid encounter within last 3 months    Recent Outpatient Visits           2 months ago Episodic mood disorder [facility])   [facility] Shann Darnel, Erlinda Haws, MD

## 2023-05-28 ENCOUNTER — Ambulatory Visit: Payer: BC Managed Care – PPO | Admitting: Family Medicine

## 2023-06-01 ENCOUNTER — Encounter: Payer: Self-pay | Admitting: Family Medicine

## 2023-06-01 ENCOUNTER — Ambulatory Visit: Admitting: Family Medicine

## 2023-06-01 VITALS — BP 101/77 | HR 62 | Resp 16 | Ht 71.0 in | Wt 233.2 lb

## 2023-06-01 DIAGNOSIS — J439 Emphysema, unspecified: Secondary | ICD-10-CM | POA: Insufficient documentation

## 2023-06-01 DIAGNOSIS — F39 Unspecified mood [affective] disorder: Secondary | ICD-10-CM | POA: Diagnosis not present

## 2023-06-01 DIAGNOSIS — I7121 Aneurysm of the ascending aorta, without rupture: Secondary | ICD-10-CM

## 2023-06-01 DIAGNOSIS — I10 Essential (primary) hypertension: Secondary | ICD-10-CM

## 2023-06-01 NOTE — Progress Notes (Signed)
 Established patient visit   Patient: Devin Parsons   DOB: 1971-01-19   53 y.o. Male  MRN: [id number] Visit Date: 06/01/2023  Today's healthcare provider: Jeralene Mom, MD   Chief Complaint  Patient presents with   Follow-up    aortic aneurysm   Subjective    HPI  Presents for follow up htn, situational stress and TAA. Is tolerating current medications well without side effects. Increased buspirone  to 15 Bid in February but job remains very stressful and can't really tell if it's helping.   Recently LDCT for lung cancer screening and thoracic aneurysm was measured at 4.6 cm, up from 4.3 cm on prior CTA 12/20/2021    Medications: Outpatient Medications Prior to Visit  Medication Sig   amLODipine  (NORVASC ) 5 MG tablet TAKE 1 TABLET(5 MG) BY MOUTH DAILY   benazepril -hydrochlorthiazide (LOTENSIN  HCT) 20-12.5 MG tablet TAKE 1 TABLET BY MOUTH DAILY   busPIRone  (BUSPAR ) 15 MG tablet Take 1 tablet (15 mg total) by mouth 2 (two) times daily.   famotidine  (PEPCID ) 20 MG tablet Take 1 tablet (20 mg total) by mouth 2 (two) times daily.   HYDROcodone -acetaminophen  (NORCO/VICODIN) 5-325 MG tablet Take 1 tablet by mouth every 4 (four) hours as needed for moderate pain (pain score 4-6).   hyoscyamine  (LEVSIN SL) 0.125 MG SL tablet DISSOLVE 1 TABLET(0.125 MG) UNDER THE TONGUE EVERY 4 HOURS AS NEEDED   ibuprofen (ADVIL,MOTRIN) 200 MG tablet Take 400-600 mg by mouth 2 (two) times daily as needed for headache or mild pain.    meloxicam  (MOBIC ) 15 MG tablet TAKE 1 TABLET BY MOUTH EVERY DAY AS NEEDED FOR PAIN, REPLACES NABUMETONE    albuterol  (VENTOLIN  HFA) 108 (90 Base) MCG/ACT inhaler Inhale 1-2 puffs into the lungs every 6 (six) hours as needed.   promethazine -dextromethorphan (PROMETHAZINE -DM) 6.25-15 MG/5ML syrup Take 5 mLs by mouth 4 (four) times daily as needed.   venlafaxine  XR (EFFEXOR -XR) 75 MG 24 hr capsule TAKE 1 CAPSULE(75 MG) BY MOUTH DAILY WITH BREAKFAST   No  facility-administered medications prior to visit.    Review of Systems  Constitutional:  Negative for appetite change, chills and fever.  Respiratory:  Negative for chest tightness, shortness of breath and wheezing.   Cardiovascular:  Negative for chest pain and palpitations.  Gastrointestinal:  Negative for abdominal pain, nausea and vomiting.       Objective    BP 101/77 (BP Location: Left Arm, Patient Position: Sitting, Cuff Size: Normal)   Pulse 62   Resp 16   Ht 5\' 11"  (1.803 m)   Wt 233 lb 3.2 oz (105.8 kg)   SpO2 97%   BMI 32.52 kg/m    Physical Exam   General appearance: Mildly obese male, cooperative and in no acute distress Head: Normocephalic, without obvious abnormality, atraumatic Respiratory: Respirations even and unlabored, normal respiratory rate Extremities: All extremities are intact.  Skin: Skin color, texture, turgor normal. No rashes seen  Psych: Appropriate mood and affect. Neurologic: Mental status: Alert, oriented to person, place, and time, thought content appropriate.    Assessment & Plan     1. Aneurysm of ascending aorta without rupture (HCC) (Primary) Increased in size from 4.3cm on CTA 11/2021 to 4.6cm on LDCT 2025. Discussed recommended and follow up and will proceed with Ambulatory referral to Vascular Surgery  2. Primary hypertension Very well controlled on current medications.   3. Episodic mood disorder (HCC) Tolerating buspirone  but doesn't feel it has helped much, as his job remains very  stressful. Consider low BP I'm hesitant to increase buspirone  dose at this time.   4. Emphysema on low dose CT.   Asymptomatic, quit smoking.   Return in about 5 months (around 11/01/2023). For yearly physical       Jeralene Mom, MD  [facility] [phone] (phone) [phone] (fax)  [facility]

## 2023-06-01 NOTE — Patient Instructions (Signed)
 Devin Parsons  Please review the attached list of medications and notify my office if there are any errors.   . Please bring all of your medications to every appointment so we can make sure that our medication list is the same as yours.

## 2023-06-19 ENCOUNTER — Other Ambulatory Visit: Payer: Self-pay | Admitting: Family Medicine

## 2023-06-19 DIAGNOSIS — G8929 Other chronic pain: Secondary | ICD-10-CM

## 2023-06-19 DIAGNOSIS — M5020 Other cervical disc displacement, unspecified cervical region: Secondary | ICD-10-CM

## 2023-06-19 NOTE — Telephone Encounter (Signed)
 Copied from CRM [id number]. Topic: Clinical - Medication Refill >> Jun 19, 2023 11:19 AM Cynthia K wrote: Medication: HYDROcodone -acetaminophen  (NORCO/VICODIN) 5-325 MG tablet   Has the patient contacted their pharmacy? Yes (Agent: If no, request that the patient contact the pharmacy for the refill. If patient does not wish to contact the pharmacy document the reason why and proceed with request.) (Agent: If yes, when and what did the pharmacy advise?) Pharmacy needs order to refill  This is the patient's preferred pharmacy:  [facility] DR[facility]ORE #[id number] [place], [place] - [address] AT [facility] OF [address] [place] [address] [place] [address] Phone: [phone] Fax: [phone]  Is this the correct pharmacy for this prescription? No If no, delete pharmacy and type the correct one.   Has the prescription been filled recently? No  Is the patient out of the medication? No  Has the patient been seen for an appointment in the last year OR does the patient have an upcoming appointment? Yes  Can we respond through MyChart? Yes  Agent: Please be advised that Rx refills may take up to 3 business days. We ask that you follow-up with your pharmacy.

## 2023-06-21 ENCOUNTER — Other Ambulatory Visit: Payer: Self-pay | Admitting: Family Medicine

## 2023-06-21 DIAGNOSIS — G8929 Other chronic pain: Secondary | ICD-10-CM

## 2023-06-21 DIAGNOSIS — M5020 Other cervical disc displacement, unspecified cervical region: Secondary | ICD-10-CM

## 2023-06-21 NOTE — Telephone Encounter (Signed)
 Copied from CRM [id number]. Topic: Clinical - Medication Refill >> Jun 21, 2023 12:33 PM Carlatta H wrote: Medication: HYDROcodone -acetaminophen  (NORCO/VICODIN) 5-325 MG tablet [[id number]]  Has the patient contacted their pharmacy? No (Agent: If no, request that the patient contact the pharmacy for the refill. If patient does not wish to contact the pharmacy document the reason why and proceed with request.) (Agent: If yes, when and what did the pharmacy advise?)  This is the patient's preferred pharmacy:  [facility] DRUG STORE #[id number] [place] Gall[place], [place] - [address] AT [facility] OF [address] [place] [address] [place] [address] Phone: [phone] Fax: [phone]   Is this the correct pharmacy for this prescription? Yes If no, delete pharmacy and type the correct one.   Has the prescription been filled recently? No  Is the patient out of the medication? Yes  Has the patient been seen for an appointment in the last year OR does the patient have an upcoming appointment? Yes  Can we respond through MyChart? No  Agent: Please be advised that Rx refills may take up to 3 business days. We ask that you follow-up with your pharmacy.

## 2023-06-21 NOTE — Telephone Encounter (Signed)
 Requested medication (s) are due for refill today: yes  Requested medication (s) are on the active medication list: yes  Last refill:  05/25/23  Future visit scheduled: no  Notes to clinic:  Unable to refill per protocol, cannot delegate.     Requested Prescriptions  Pending Prescriptions Disp Refills   HYDROcodone -acetaminophen  (NORCO/VICODIN) 5-325 MG tablet 150 tablet 0    Sig: Take 1 tablet by mouth every 4 (four) hours as needed for moderate pain (pain score 4-6).     Not Delegated - Analgesics:  Opioid Agonist Combinations Failed - 06/21/2023  3:43 PM      Failed - This refill cannot be delegated      Failed - Urine Drug Screen completed in last 360 days      Failed - Valid encounter within last 3 months    Recent Outpatient Visits           2 weeks ago Aneurysm of ascending aorta without rupture [facility])   [place] [facility] Lamon Pillow, MD   3 months ago Episodic mood disorder [facility])   [facility] Lamon Pillow, MD

## 2023-06-22 MED ORDER — HYDROCODONE-ACETAMINOPHEN 5-325 MG PO TABS
1.0000 | ORAL_TABLET | ORAL | 0 refills | Status: DC | PRN
Start: 2023-06-22 — End: 2023-07-17

## 2023-06-22 NOTE — Telephone Encounter (Signed)
 Pt calling as he is out of medication. Pt requesting this be sent in as soon as possible. Advised awaiting provider response.

## 2023-06-22 NOTE — Telephone Encounter (Signed)
 Requested medication (s) are due for refill today: no  Requested medication (s) are on the active medication list: yes  Last refill:  today  Future visit scheduled: no  Notes to clinic:  not delegated to refuse  HYDROcodone -acetaminophen  (NORCO/VICODIN) 5-325 MG tablet 150 tablet 0 06/22/2023 --   Sig - Route: Take 1 tablet by mouth every 4 (four) hours as needed for moderate pain (pain score 4-6). - Oral   Sent to pharmacy as: HYDROcodone -acetaminophen  (NORCO/VICODIN) 5-325 MG tablet   Earliest Fill Date: 06/22/2023   E-Prescribing Status: Receipt confirmed by pharmacy (06/22/2023  4:37 PM EDT)        Requested Prescriptions  Pending Prescriptions Disp Refills   HYDROcodone -acetaminophen  (NORCO/VICODIN) 5-325 MG tablet 150 tablet 0    Sig: Take 1 tablet by mouth every 4 (four) hours as needed for moderate pain (pain score 4-6).     Not Delegated - Analgesics:  Opioid Agonist Combinations Failed - 06/22/2023  4:46 PM      Failed - This refill cannot be delegated      Failed - Urine Drug Screen completed in last 360 days      Failed - Valid encounter within last 3 months    Recent Outpatient Visits           3 weeks ago Aneurysm of ascending aorta without rupture [facility])   [facility] Lamon Pillow, MD   3 months ago Episodic mood disorder [facility])   [facility] Lamon Pillow, MD

## 2023-06-30 ENCOUNTER — Encounter: Payer: Self-pay | Admitting: Family Medicine

## 2023-06-30 DIAGNOSIS — F39 Unspecified mood [affective] disorder: Secondary | ICD-10-CM

## 2023-07-02 MED ORDER — BUSPIRONE HCL 30 MG PO TABS
30.0000 mg | ORAL_TABLET | Freq: Two times a day (BID) | ORAL | 1 refills | Status: DC
Start: 2023-07-02 — End: 2023-12-19

## 2023-07-10 ENCOUNTER — Other Ambulatory Visit: Payer: Self-pay | Admitting: Family Medicine

## 2023-07-10 DIAGNOSIS — E782 Mixed hyperlipidemia: Secondary | ICD-10-CM

## 2023-07-13 ENCOUNTER — Encounter (INDEPENDENT_AMBULATORY_CARE_PROVIDER_SITE_OTHER): Payer: Self-pay | Admitting: Nurse Practitioner

## 2023-07-13 ENCOUNTER — Ambulatory Visit (INDEPENDENT_AMBULATORY_CARE_PROVIDER_SITE_OTHER): Payer: Self-pay | Admitting: Nurse Practitioner

## 2023-07-13 VITALS — BP 105/72 | HR 69 | Resp 18 | Ht 70.5 in | Wt 231.0 lb

## 2023-07-13 DIAGNOSIS — I10 Essential (primary) hypertension: Secondary | ICD-10-CM

## 2023-07-13 DIAGNOSIS — I7121 Aneurysm of the ascending aorta, without rupture: Secondary | ICD-10-CM

## 2023-07-16 ENCOUNTER — Encounter (INDEPENDENT_AMBULATORY_CARE_PROVIDER_SITE_OTHER): Payer: Self-pay | Admitting: Nurse Practitioner

## 2023-07-16 NOTE — Progress Notes (Signed)
 Subjective:    Patient ID: EEAN BUSS, male    DOB: 1970/03/07, 53 y.o.   MRN: [id number] Chief Complaint  Patient presents with   New Patient (Initial Visit)    Ref Fisher consult atherosclerotic thoracic aorta with 4.6 cm ascending thoracic aortic aneurysm     The patient is a 53 year old male who presents today as a referral from his primary care provider Dr. Gasper in regards to a thoracic ascending aortic aneurysm.  He underwent a CTA in February which showed his had approximately 4.6 cm.  He had a known aneurysm which appeared to be slightly increased from previous studies in November 2023.  It was previously 4.3 cm.  The patient was previously a smoker and has quit smoking.  He also has gotten his blood pressure under good control he currently denies any claudication-like symptoms or rest pain.  He denies any signs symptoms of any distal embolization.    Review of Systems  All other systems reviewed and are negative.      Objective:   Physical Exam Vitals reviewed.  HENT:     Head: Normocephalic.   Cardiovascular:     Rate and Rhythm: Normal rate.     Pulses: Normal pulses.  Pulmonary:     Effort: Pulmonary effort is normal.   Skin:    General: Skin is warm and dry.   Neurological:     Mental Status: He is alert and oriented to person, place, and time.   Psychiatric:        Mood and Affect: Mood normal.        Behavior: Behavior normal.        Thought Content: Thought content normal.        Judgment: Judgment normal.     BP 105/72   Pulse 69   Resp 18   Ht 5' 10.5 (1.791 m)   Wt 231 lb (104.8 kg)   BMI 32.68 kg/m   Past Medical History:  Diagnosis Date   COVID-19 12/24/2018   By patient reports tested positive at [facility]   History of chicken pox    SVT (supraventricular tachycardia) (HCC)    a. 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   Vaping Use   Vaping status: Every Day   Start date: 09/23/2021  Substance and Sexual Activity   Alcohol use: No    Alcohol/week: 0.0 standard drinks of alcohol    Comment: formerly a heavy drinker in high school   Drug use: No   Sexual activity: Yes  Other Topics Concern   Not on file  Social History Narrative   Not on file   Social Drivers of Health   Financial  Resource Strain: Low Risk  (12/20/2022)   Overall Financial Resource Strain (CARDIA)    Difficulty of Paying Living Expenses: Not hard at all  Food Insecurity: No Food Insecurity (12/20/2022)   Hunger Vital Sign    Worried About Running Out of Food in the Last Year: Never true    Ran Out of Food in the Last Year: Never true  Transportation Needs: No Transportation Needs (12/20/2022)   PRAPARE - Administrator, Civil Service (Medical): No    Lack of Transportation (Non-Medical): No  Physical Activity: Insufficiently Active (12/20/2022)   Exercise Vital Sign    Days of Exercise per Week: 1 day    Minutes of Exercise per Session: 20 min  Stress: Stress Concern Present (12/20/2022)   [employer] of Occupational Health - Occupational Stress Questionnaire    Feeling of Stress : Very much  Social Connections: Socially Isolated (12/20/2022)   Social Connection and Isolation Panel    Frequency of Communication with Friends and Family: Never    Frequency of  Social Gatherings with Friends and Family: Never    Attends Religious Services: Never    Database administrator or Organizations: No    Attends Engineer, structural: Not on file    Marital Status: Married  Catering manager Violence: Not At Risk (10/25/2022)   Humiliation, Afraid, Rape, and Kick questionnaire    Fear of Current or Ex-Partner: No    Emotionally Abused: No    Physically Abused: No    Sexually Abused: No    Past Surgical History:  Procedure Laterality Date   FRACTURE SURGERY     KNEE SURGERY Left    Arthroscopy surgery performed   MOHS SURGERY Right 10/08/2017   [place] Dr. Renita HOUSTON   MRI OF Lumbar spine  06/14/2004   Results: Herniated disc with facet reaction L5/S1. [facility]    TONSILLECTOMY AND ADENOIDECTOMY      Family History  Problem Relation Age of Onset   Diabetes Mother     No Known Allergies     Latest Ref Rng & Units 02/26/2023    9:00 AM 10/25/2022    9:16 AM 12/13/2021    8:10 AM  CBC  WBC 3.4 - 10.8 x10E3/uL 8.5  7.5  8.2   Hemoglobin 13.0 - 17.7 g/dL 85.2  85.0  85.1   Hematocrit 37.5 - 51.0 % 44.4  45.1  43.6   Platelets 150 - 450 x10E3/uL 360  364  373       CMP     Component Value Date/Time   NA 140 02/26/2023 0900   K 4.2 02/26/2023 0900   CL 103 02/26/2023 0900   CO2 23 02/26/2023 0900   GLUCOSE 90 02/26/2023 0900   GLUCOSE 107 (H) 04/16/2019 1418   BUN 16 02/26/2023 0900   CREATININE 1.03 02/26/2023 0900   CALCIUM 9.8 02/26/2023 0900   PROT 7.3 02/26/2023 0900   ALBUMIN 4.6 02/26/2023 0900   AST 18 02/26/2023 0900   ALT 31 02/26/2023 0900   ALKPHOS 73 02/26/2023 0900   BILITOT 0.3 02/26/2023 0900   EGFR 86 02/26/2023 0900   GFRNONAA 99 08/18/2019 0915     No results found.     Assessment & Plan:   1. Aneurysm of ascending aorta without rupture (HCC) (Primary) Recommend:  No surgery or intervention is indicated at this time.  The patient has an asymptomatic thoracic  aortic aneurysm that is less than 6.0 cm  in maximal diameter.  I have discussed the natural history of thoracic aortic aneurysm and the small risk of rupture for aneurysm less than 6.5 cm in size.  However, as these small aneurysms tend to enlarge over time, continued surveillance with CT scan is mandatory.   I have also discussed optimizing medical management with hypertension and lipid control and the negative effect that any tobacco products have on aneurysmal disease.  The patient is also encouraged to exercise a minimum of 30 minutes 4 times a week.   Should the patient develop new onset chest or back pain or signs of peripheral embolization they are instructed to seek medical attention immediately and to alert the physician providing care that they have an aneurysm in the chest.   The patient voices their understanding.  The patient typically gets symptoms we will continue to monitor on an annual basis following the CT  2. Primary hypertension Continue antihypertensive medications as already ordered, these medications have been reviewed and there are no changes at this time.    Current Outpatient Medications on File Prior to Visit  Medication Sig Dispense Refill   amLODipine  (NORVASC ) 5 MG tablet TAKE 1 TABLET(5 MG) BY MOUTH DAILY 90 tablet 1   benazepril -hydrochlorthiazide (LOTENSIN  HCT) 20-12.5 MG tablet TAKE 1 TABLET BY MOUTH DAILY 90 tablet 1   busPIRone  (BUSPAR ) 30 MG tablet Take 1 tablet (30 mg total) by mouth 2 (two) times daily. 60 tablet 1   famotidine  (PEPCID ) 20 MG tablet Take 1 tablet (20 mg total) by mouth 2 (two) times daily. 60 tablet 0   HYDROcodone -acetaminophen  (NORCO/VICODIN) 5-325 MG tablet Take 1 tablet by mouth every 4 (four) hours as needed for moderate pain (pain score 4-6). 150 tablet 0   hyoscyamine  (LEVSIN  SL) 0.125 MG SL tablet DISSOLVE 1 TABLET(0.125 MG) UNDER THE TONGUE EVERY 4 HOURS AS NEEDED 15 tablet 0   ibuprofen (ADVIL,MOTRIN) 200 MG tablet Take 400-600  mg by mouth 2 (two) times daily as needed for headache or mild pain.      meloxicam  (MOBIC ) 15 MG tablet TAKE 1 TABLET BY MOUTH EVERY DAY AS NEEDED FOR PAIN, REPLACES NABUMETONE  30 tablet 5   No current facility-administered medications on file prior to visit.    There are no Patient Instructions on file for this visit. No follow-ups on file.   Mariame Rybolt E Mildreth Reek, NP

## 2023-07-17 ENCOUNTER — Other Ambulatory Visit: Payer: Self-pay | Admitting: Family Medicine

## 2023-07-17 DIAGNOSIS — G8929 Other chronic pain: Secondary | ICD-10-CM

## 2023-07-17 DIAGNOSIS — M5020 Other cervical disc displacement, unspecified cervical region: Secondary | ICD-10-CM

## 2023-07-17 NOTE — Telephone Encounter (Unsigned)
 Copied from CRM [id number]. Topic: Clinical - Medication Refill >> Jul 17, 2023 10:02 AM Cristopher B wrote: Medication: HYDROcodone -acetaminophen  (NORCO/VICODIN) 5-325 MG tablet  Has the patient contacted their pharmacy? No (Agent: If no, request that the patient contact the pharmacy for the refill. If patient does not wish to contact the pharmacy document the reason why and proceed with request.) (Agent: If yes, when and what did the pharmacy advise?)  This is the patient's preferred pharmacy:  [facility] DRUG STORE #[id number] [place], [place] - [address] AT [facility] OF [address] [place] [address] [place] [address] Phone: [phone] Fax: [phone]   Is this the correct pharmacy for this prescription? Yes If no, delete pharmacy and type the correct one.   Has the prescription been filled recently? No  Is the patient out of the medication? Yes  Has the patient been seen for an appointment in the last year OR does the patient have an upcoming appointment? Yes  Can we respond through MyChart? Yes  Agent: Please be advised that Rx refills may take up to 3 business days. We ask that you follow-up with your pharmacy.

## 2023-07-18 MED ORDER — HYDROCODONE-ACETAMINOPHEN 5-325 MG PO TABS: 1.0000 | ORAL_TABLET | ORAL | 0 refills | Status: DC | PRN

## 2023-07-18 NOTE — Telephone Encounter (Signed)
 Copied from CRM [id number]. Topic: Clinical - Prescription Issue >> Jul 18, 2023 10:03 AM Chasity T wrote: Reason for CRM: Patient is calling in to check status of the HYDROcodone -acetaminophen  (NORCO/VICODIN) 5-325 MG tablet. Advised its still pending and he wants to let PCP know that he is completley out of the medication.

## 2023-08-10 ENCOUNTER — Other Ambulatory Visit: Payer: Self-pay | Admitting: Family Medicine

## 2023-08-10 DIAGNOSIS — M5020 Other cervical disc displacement, unspecified cervical region: Secondary | ICD-10-CM

## 2023-08-10 DIAGNOSIS — M545 Low back pain, unspecified: Secondary | ICD-10-CM

## 2023-08-10 NOTE — Telephone Encounter (Signed)
 Copied from CRM [id number]. Topic: Clinical - Medication Refill >> Aug 10, 2023 12:33 PM Sasha H wrote: Medication:  HYDROcodone -acetaminophen  (NORCO/VICODIN) 5-325 MG tablet  Has the patient contacted their pharmacy? No (Agent: If no, request that the patient contact the pharmacy for the refill. If patient does not wish to contact the pharmacy document the reason why and proceed with request.) (Agent: If yes, when and what did the pharmacy advise?)  This is the patient's preferred pharmacy:  [facility] DR[facility] STORE #[id number] [place], [place] - [address] AT [facility] [address] [place] [address] [place] [address] Phone: [phone] Fax: [phone]    Is this the correct pharmacy for this prescription? Yes If no, delete pharmacy and type the correct one.   Has the prescription been filled recently? Yes  Is the patient out of the medication? Yes  Has the patient been seen for an appointment in the last year OR does the patient have an upcoming appointment? Yes  Can we respond through MyChart? Yes  Agent: Please be advised that Rx refills may take up to 3 business days. We ask that you follow-up with your pharmacy.

## 2023-08-13 ENCOUNTER — Telehealth: Payer: Self-pay

## 2023-08-13 MED ORDER — HYDROCODONE-ACETAMINOPHEN 5-325 MG PO TABS: 1.0000 | ORAL_TABLET | ORAL | 0 refills | Status: DC | PRN

## 2023-08-13 NOTE — Telephone Encounter (Signed)
 LOV 06/01/23 NOV 11/02/23 LRF 07/18/23 qty:150 r:0

## 2023-08-13 NOTE — Telephone Encounter (Signed)
 Copied from CRM [id number]. Topic: Clinical - Prescription Issue >> Aug 13, 2023  9:24 AM Thersia BROCKS wrote: Reason for CRM: Patient called in regarding prescription HYDROcodone -acetaminophen  (NORCO/VICODIN) 5-325 MG tablet , informed him that it was still pending and since it was over the weekend, patient stated he is completely out now

## 2023-09-06 ENCOUNTER — Telehealth: Payer: Self-pay | Admitting: Family Medicine

## 2023-09-06 DIAGNOSIS — M5020 Other cervical disc displacement, unspecified cervical region: Secondary | ICD-10-CM

## 2023-09-06 DIAGNOSIS — M545 Low back pain, unspecified: Secondary | ICD-10-CM

## 2023-09-06 NOTE — Telephone Encounter (Unsigned)
 Copied from CRM #[id number]. Topic: Clinical - Medication Refill >> Sep 06, 2023 10:53 AM Tonda B wrote: Medication: HYDROcodone -acetaminophen  (NORCO/VICODIN) 5-325 MG tablet  Has the patient contacted their pharmacy? Yes (Agent: If no, request that the patient contact the pharmacy for the refill. If patient does not wish to contact the pharmacy document the reason why and proceed with request.) (Agent: If yes, when and what did the pharmacy advise?)  This is the patient's preferred pharmacy:  [facility] DRUG STORE #[id number] [place], [place] - [address] AT [facility] [address] [place] [address] [place] [address] Phone: [phone] Fax: [phone]  [facility] [address] [place] - [place], [place] - [address] [facility] AT [facility] [address] [place] [address] Phone: [phone] Fax: [phone]  Is this the correct pharmacy for this prescription? Yes If no, delete pharmacy and type the correct one.   Has the prescription been filled recently? Yes  Is the patient out of the medication? Yes  Has the patient been seen for an appointment in the last year OR does the patient have an upcoming appointment? Yes  Can we respond through MyChart? No  Agent: Please be advised that Rx refills may take up to 3 business days. We ask that you follow-up with your pharmacy.

## 2023-09-10 MED ORDER — HYDROCODONE-ACETAMINOPHEN 5-325 MG PO TABS: 1.0000 | ORAL_TABLET | ORAL | 0 refills | Status: DC | PRN

## 2023-09-10 NOTE — Telephone Encounter (Signed)
 Copied from CRM #[id number]. Topic: Clinical - Medication Refill >> Sep 06, 2023 10:53 AM Tonda B wrote: Medication: HYDROcodone -acetaminophen  (NORCO/VICODIN) 5-325 MG tablet  Has the patient contacted their pharmacy? Yes (Agent: If no, request that the patient contact the pharmacy for the refill. If patient does not wish to contact the pharmacy document the reason why and proceed with request.) (Agent: If yes, when and what did the pharmacy advise?)  This is the patient's preferred pharmacy:  [facility] DRUG STORE #[id number] [place], [place] - [address] AT [facility] OF [address] [place] [address] [place] [address] Phone: [phone] Fax: [phone]  [facility] [address] Commons - [place], [place] - [address] [facility] AT [facility] [address] [place] [address] Phone: [phone] Fax: [phone]  Is this the correct pharmacy for this prescription? Yes If no, delete pharmacy and type the correct one.   Has the prescription been filled recently? Yes  Is the patient out of the medication? Yes  Has the patient been seen for an appointment in the last year OR does the patient have an upcoming appointment? Yes  Can we respond through MyChart? No  Agent: Please be advised that Rx refills may take up to 3 business days. We ask that you follow-up with your pharmacy. >> Sep 10, 2023  9:16 AM Franky GRADE wrote: Patient is calling to follow up on the request. I advised of the 3 day turnaround time; however, patient states the provider usually fills it within a day of him calling.

## 2023-09-10 NOTE — Telephone Encounter (Signed)
 Requesting: HYDROcodone -acetaminophen  (NORCO/VICODIN)  Contract: No UDS: 03/11/21 Last Visit: 06/01/2023 Next Visit: 11/02/2023 Last Refill: 08/13/23 qty 150 r:0  Please Advise

## 2023-10-04 ENCOUNTER — Other Ambulatory Visit: Payer: Self-pay | Admitting: Family Medicine

## 2023-10-04 DIAGNOSIS — M545 Low back pain, unspecified: Secondary | ICD-10-CM

## 2023-10-04 DIAGNOSIS — M5020 Other cervical disc displacement, unspecified cervical region: Secondary | ICD-10-CM

## 2023-10-04 NOTE — Telephone Encounter (Unsigned)
 Copied from CRM [id number]. Topic: Clinical - Medication Refill >> Oct 04, 2023 11:41 AM Delon DASEN wrote: Medication: HYDROcodone -acetaminophen  (NORCO/VICODIN) 5-325 MG tablet  Has the patient contacted their pharmacy? No (Agent: If no, request that the patient contact the pharmacy for the refill. If patient does not wish to contact the pharmacy document the reason why and proceed with request.) (Agent: If yes, when and what did the pharmacy advise?)  This is the patient's preferred pharmacy:  [facility] DRUG STORE #[id number] [place], [place] - [address] AT [facility] OF [address] [place] [address] [place] [address] Phone: [phone] Fax: [phone]    Is this the correct pharmacy for this prescription? Yes If no, delete pharmacy and type the correct one.   Has the prescription been filled recently? Yes  Is the patient out of the medication? No  Has the patient been seen for an appointment in the last year OR does the patient have an upcoming appointment? Yes  Can we respond through MyChart? Yes  Agent: Please be advised that Rx refills may take up to 3 business days. We ask that you follow-up with your pharmacy.

## 2023-10-05 NOTE — Telephone Encounter (Signed)
 Requested medications are due for refill today.  yes  Requested medications are on the active medications list.  yes  Last refill. 09/10/2023 #150 0 rf  Future visit scheduled.   yes  Notes to clinic.  Refill not delegated.    Requested Prescriptions  Pending Prescriptions Disp Refills   HYDROcodone -acetaminophen  (NORCO/VICODIN) 5-325 MG tablet 150 tablet 0    Sig: Take 1 tablet by mouth every 4 (four) hours as needed for moderate pain (pain score 4-6).     Not Delegated - Analgesics:  Opioid Agonist Combinations Failed - 10/05/2023 11:02 AM      Failed - This refill cannot be delegated      Failed - Urine Drug Screen completed in last 360 days      Failed - Valid encounter within last 3 months    Recent Outpatient Visits           4 months ago Aneurysm of ascending aorta without rupture [facility])   [facility] Gasper Nancyann BRAVO, MD   7 months ago Episodic mood disorder [facility])   [facility] Gasper Nancyann BRAVO, MD

## 2023-10-07 MED ORDER — HYDROCODONE-ACETAMINOPHEN 5-325 MG PO TABS: 1.0000 | ORAL_TABLET | ORAL | 0 refills | Status: DC | PRN

## 2023-10-31 ENCOUNTER — Other Ambulatory Visit: Payer: Self-pay | Admitting: Family Medicine

## 2023-10-31 DIAGNOSIS — M545 Low back pain, unspecified: Secondary | ICD-10-CM

## 2023-10-31 DIAGNOSIS — M5020 Other cervical disc displacement, unspecified cervical region: Secondary | ICD-10-CM

## 2023-10-31 NOTE — Telephone Encounter (Unsigned)
 Copied from CRM [id number]. Topic: Clinical - Medication Refill >> Oct 31, 2023  9:29 AM Joesph NOVAK wrote: Medication: HYDROcodone -acetaminophen  (NORCO/VICODIN) 5-325 MG tablet  Has the patient contacted their pharmacy? No (Agent: If no, request that the patient contact the pharmacy for the refill. If patient does not wish to contact the pharmacy document the reason why and proceed with request.) (Agent: If yes, when and what did the pharmacy advise?)  This is the patient's preferred pharmacy:  [facility] [address] - [place], [place] - [address] [facility] AT [facility] [address] [place] [address] Phone: [phone] Fax: [phone]  Is this the correct pharmacy for this prescription? Yes If no, delete pharmacy and type the correct one.   Has the prescription been filled recently? Yes  Is the patient out of the medication? Yes  Has the patient been seen for an appointment in the last year OR does the patient have an upcoming appointment? Yes  Can we respond through MyChart? Yes  Agent: Please be advised that Rx refills may take up to 3 business days. We ask that you follow-up with your pharmacy.

## 2023-11-01 NOTE — Telephone Encounter (Signed)
 Requested medication (s) are due for refill today: yes  Requested medication (s) are on the active medication list: yes  Last refill:  10/07/23  Future visit scheduled: yes  Notes to clinic:  Unable to refill per protocol, cannot delegate.      Requested Prescriptions  Pending Prescriptions Disp Refills   HYDROcodone -acetaminophen  (NORCO/VICODIN) 5-325 MG tablet 150 tablet 0    Sig: Take 1 tablet by mouth every 4 (four) hours as needed for moderate pain (pain score 4-6).     Not Delegated - Analgesics:  Opioid Agonist Combinations Failed - 11/01/2023  4:08 PM      Failed - This refill cannot be delegated      Failed - Urine Drug Screen completed in last 360 days      Failed - Valid encounter within last 3 months    Recent Outpatient Visits           5 months ago Aneurysm of ascending aorta without rupture   [facility] Gas[facility] Nancyann BRAVO, MD   8 months ago Episodic mood disorder   [facility] Nancyann BRAVO, MD

## 2023-11-02 ENCOUNTER — Other Ambulatory Visit: Payer: Self-pay | Admitting: Family Medicine

## 2023-11-02 ENCOUNTER — Ambulatory Visit: Admitting: Family Medicine

## 2023-11-02 ENCOUNTER — Telehealth: Payer: Self-pay

## 2023-11-02 MED ORDER — HYDROCODONE-ACETAMINOPHEN 5-325 MG PO TABS: 1.0000 | ORAL_TABLET | ORAL | 0 refills | Status: DC | PRN

## 2023-11-02 NOTE — Telephone Encounter (Signed)
 Patient has called back again asking for his refill of Hydrocodone -Acet 5-325 mg. to be sent to [facility].  He is totally out of pills.

## 2023-11-02 NOTE — Telephone Encounter (Signed)
 The patient called in checking on the status of his refill request. I called and spoke with Niels and she will pass this to another provider as Dr Gasper has been out. She will have someone call the patient when it has been addressed. I relayed to the patient and he expressed understanding and was appreciative.

## 2023-11-02 NOTE — Telephone Encounter (Signed)
 Copied from CRM [id number]. Topic: Clinical - Medication Refill >> Oct 31, 2023  9:29 AM Joesph NOVAK wrote: Medication: HYDROcodone -acetaminophen  (NORCO/VICODIN) 5-325 MG tablet  Has the patient contacted their pharmacy? No (Agent: If no, request that the patient contact the pharmacy for the refill. If patient does not wish to contact the pharmacy document the reason why and proceed with request.) (Agent: If yes, when and what did the pharmacy advise?)  This is the patient's preferred pharmacy:  [facility] [address] [facility] - [place], [place] - [address] [facility] [address] [facility] [address] [place] [address] Phone: [phone] Fax: [phone]  Is this the correct pharmacy for this prescription? Yes If no, delete pharmacy and type the correct one.   Has the prescription been filled recently? Yes  Is the patient out of the medication? Yes  Has the patient been seen for an appointment in the last year OR does the patient have an upcoming appointment? Yes  Can we respond through MyChart? Yes  Agent: Please be advised that Rx refills may take up to 3 business days. We ask that you follow-up with your pharmacy. >> Nov 02, 2023  9:51 AM Emylou G wrote: Please call patient - pending refill (wasn't sure if there was an issue)

## 2023-11-05 NOTE — Telephone Encounter (Signed)
 Copied from CRM [id number]. Topic: Clinical - Prescription Issue >> Nov 05, 2023  8:58 AM Darshell M wrote: Reason for CRM: Patient following up on status of med refill request from 10/8. >> Nov 05, 2023  9:00 AM Nurse Lauraine BROCKS wrote: Hydrocodone  Refill

## 2023-11-05 NOTE — Telephone Encounter (Signed)
 Please review.  Looks like prescription was sent in by Dr.Pardue.

## 2023-11-07 ENCOUNTER — Encounter: Payer: Self-pay | Admitting: Family Medicine

## 2023-11-07 ENCOUNTER — Ambulatory Visit (INDEPENDENT_AMBULATORY_CARE_PROVIDER_SITE_OTHER): Payer: Self-pay | Admitting: Family Medicine

## 2023-11-07 VITALS — BP 120/98 | HR 66 | Ht 70.0 in | Wt 236.0 lb

## 2023-11-07 DIAGNOSIS — F439 Reaction to severe stress, unspecified: Secondary | ICD-10-CM

## 2023-11-07 MED ORDER — DESVENLAFAXINE SUCCINATE ER 50 MG PO TB24: 50.0000 mg | ORAL_TABLET | Freq: Every day | ORAL | 2 refills | Status: DC

## 2023-11-07 NOTE — Progress Notes (Signed)
 Established patient visit   Patient: Devin Parsons   DOB: 05/25/1970   53 y.o. Male  MRN: [id number] Visit Date: 11/07/2023  Today's healthcare provider: Nancyann Perry, MD   Chief Complaint  Patient presents with   Medical Management of Chronic Issues   Subjective    Discussed the use of AI scribe software for clinical note transcription with the patient, who gave verbal consent to proceed.  History of Present Illness   Devin Parsons is a 53 year old male with hypertension, tachycardia, and anxiety who presents for follow-up.   He has been experiencing increasing anxiety since his last visit in May. In June, his buspirone  dose was increased to 30 mg twice a day, but he discontinued it due to lightheadedness and lack of efficacy. Stopping the medication did not alter his anxiety levels. He has tried various anxiety medications in the past, including paroxetine , which caused aggression and irritability. He finds some relief in reading and riding his motorcycle.  He has not been working since last August, which he finds stressful, although he has several job interviews lined up. He describes experiencing 'highs and lows' with good weeks and weeks without hearing back from potential employers. Financial stability is maintained due to inheritances following the passing of his wife's mother and his own mother, which has provided some relief despite the stress of unemployment.  Regarding his hypertension, he has not been regularly checking his blood pressure at home since he stopped working, but he recalls it being similar to previous readings. He is currently taking his blood pressure medication and reports no issues with it.     Lab Results  Component Value Date   NA 140 02/26/2023   K 4.2 02/26/2023   CREATININE 1.03 02/26/2023   EGFR 86 02/26/2023   GLUCOSE 90 02/26/2023     Medications: Outpatient Medications Prior to Visit  Medication Sig   amLODipine  (NORVASC ) 5 MG  tablet TAKE 1 TABLET(5 MG) BY MOUTH DAILY   benazepril -hydrochlorthiazide (LOTENSIN  HCT) 20-12.5 MG tablet TAKE 1 TABLET BY MOUTH DAILY   famotidine  (PEPCID ) 20 MG tablet Take 1 tablet (20 mg total) by mouth 2 (two) times daily.   HYDROcodone -acetaminophen  (NORCO/VICODIN) 5-325 MG tablet Take 1 tablet by mouth every 4 (four) hours as needed for moderate pain (pain score 4-6).   hyoscyamine  (LEVSIN  SL) 0.125 MG SL tablet DISSOLVE 1 TABLET(0.125 MG) UNDER THE TONGUE EVERY 4 HOURS AS NEEDED   ibuprofen (ADVIL,MOTRIN) 200 MG tablet Take 400-600 mg by mouth 2 (two) times daily as needed for headache or mild pain.    meloxicam  (MOBIC ) 15 MG tablet TAKE 1 TABLET BY MOUTH EVERY DAY AS NEEDED FOR PAIN, REPLACES NABUMETONE    busPIRone  (BUSPAR ) 30 MG tablet Take 1 tablet (30 mg total) by mouth 2 (two) times daily.   No facility-administered medications prior to visit.        Objective    BP (!) 120/98 (BP Location: Left Arm, Patient Position: Sitting, Cuff Size: Normal)   Pulse 66   Ht 5' 10 (1.778 m)   Wt 236 lb (107 kg)   SpO2 98%   BMI 33.86 kg/m   Physical Exam   General: Appearance:    Mildly obese male in no acute distress  Eyes:    PERRL, conjunctiva/corneas clear, EOM's intact       Lungs:     Clear to auscultation bilaterally, respirations unlabored  Heart:    Normal heart rate. Normal rhythm.  No murmurs, rubs, or gallops.    MS:   All extremities are intact.    Neurologic:   Awake, alert, oriented x 3. No apparent focal neurological defect.          Assessment & Plan     Anxiety disorder Anxiety symptoms increased since May. Buspirone  discontinued due to side effects and inefficacy. Previous medications caused adverse effects or were ineffective. Proposed trial of Pristiq (desvenlafaxine) as it has a different mechanism of action. Explained medications aid stress management but do not alter life circumstances. - Prescribe Pristiq (desvenlafaxine) and monitor response. -  Schedule follow-up in one month to assess efficacy. - Advise use of GoodRx app for medication pricing.  He does have several job interviews lines up in the next few weeks which he is optimistic about and feels that starting new job will significantly improve his anxiety.   Essential hypertension Blood pressure readings high today. Current antihypertensive medication effective and well-tolerated. Not regularly monitoring blood pressure at home. - Continue current antihypertensive regimen. - Encourage regular home blood pressure monitoring.    Return in about 6 weeks (around 12/19/2023).     Nancyann Perry, MD  [facility] [phone] (phone) [phone] (fax)  [facility]

## 2023-11-07 NOTE — Patient Instructions (Signed)
 SABRA  Please review the attached list of medications and notify my office if there are any errors.   . Please bring all of your medications to every appointment so we can make sure that our medication list is the same as yours.

## 2023-11-27 ENCOUNTER — Other Ambulatory Visit: Payer: Self-pay | Admitting: Family Medicine

## 2023-11-27 DIAGNOSIS — G8929 Other chronic pain: Secondary | ICD-10-CM

## 2023-11-27 DIAGNOSIS — M5020 Other cervical disc displacement, unspecified cervical region: Secondary | ICD-10-CM

## 2023-11-27 NOTE — Telephone Encounter (Unsigned)
 Copied from CRM [id number]. Topic: Clinical - Medication Refill >> Nov 27, 2023 12:35 PM Cleave MATSU wrote: Medication: Hydrocodone  5-325  mg  Has the patient contacted their pharmacy? No (Agent: If no, request that the patient contact the pharmacy for the refill. If patient does not wish to contact the pharmacy document the reason why and proceed with request.) (Agent: If yes, when and what did the pharmacy advise?)  This is the patient's preferred pharmacy:  [id number] [place], [place] - [address] AT [facility] OF [address] [place] [address] [place] [address] Phone: [phone] Fax: [phone]  Is this the correct pharmacy for this prescription? Yes If no, delete pharmacy and type the correct one.   Has the prescription been filled recently? No  Is the patient out of the medication? No , almost out  Has the patient been seen for an appointment in the last year OR does the patient have an upcoming appointment? Yes  Can we respond through MyChart? No  Agent: Please be advised that Rx refills may take up to 3 business days. We ask that you follow-up with your pharmacy.

## 2023-11-29 NOTE — Telephone Encounter (Signed)
 Requested medication (s) are due for refill today: yes  Requested medication (s) are on the active medication list: yes  Last refill:  11/02/23  Future visit scheduled: yes  Notes to clinic:  Unable to refill per protocol, cannot delegate.      Requested Prescriptions  Pending Prescriptions Disp Refills   HYDROcodone -acetaminophen  (NORCO/VICODIN) 5-325 MG tablet 150 tablet 0    Sig: Take 1 tablet by mouth every 4 (four) hours as needed for moderate pain (pain score 4-6).     Not Delegated - Analgesics:  Opioid Agonist Combinations Failed - 11/29/2023  8:57 AM      Failed - This refill cannot be delegated      Failed - Urine Drug Screen completed in last 360 days      Passed - Valid encounter within last 3 months    Recent Outpatient Visits           3 weeks ago Situational stress   [facility] Gas[facility] Nancyann BRAVO, MD   6 months ago Aneurysm of ascending aorta without rupture   [facility] Gasper Nancyann BRAVO, MD   9 months ago Episodic mood disorder   [facility] Gas[facility] Nancyann BRAVO, MD

## 2023-11-30 MED ORDER — HYDROCODONE-ACETAMINOPHEN 5-325 MG PO TABS: 1.0000 | ORAL_TABLET | ORAL | 0 refills | Status: DC | PRN

## 2023-12-11 ENCOUNTER — Other Ambulatory Visit: Payer: Self-pay | Admitting: Family Medicine

## 2023-12-11 DIAGNOSIS — I10 Essential (primary) hypertension: Secondary | ICD-10-CM

## 2023-12-19 ENCOUNTER — Encounter: Payer: Self-pay | Admitting: Family Medicine

## 2023-12-19 ENCOUNTER — Ambulatory Visit (INDEPENDENT_AMBULATORY_CARE_PROVIDER_SITE_OTHER): Payer: Self-pay | Admitting: Family Medicine

## 2023-12-19 VITALS — BP 120/90 | HR 99 | Wt 237.8 lb

## 2023-12-19 DIAGNOSIS — F439 Reaction to severe stress, unspecified: Secondary | ICD-10-CM

## 2023-12-19 DIAGNOSIS — M5020 Other cervical disc displacement, unspecified cervical region: Secondary | ICD-10-CM

## 2023-12-19 DIAGNOSIS — G8929 Other chronic pain: Secondary | ICD-10-CM

## 2023-12-19 MED ORDER — HYDROCODONE-ACETAMINOPHEN 7.5-325 MG PO TABS: 1.0000 | ORAL_TABLET | Freq: Four times a day (QID) | ORAL | 0 refills | Status: AC | PRN

## 2023-12-19 NOTE — Progress Notes (Signed)
 Established patient visit   Patient: Devin Parsons   DOB: 1970-04-05   53 y.o. Male  MRN: [id number] Visit Date: 12/19/2023  Today's healthcare provider: Nancyann Perry, MD   Chief Complaint  Patient presents with   Medical Management of Chronic Issues    6x week follow up , hypertension and anxiety meds   Subjective    Discussed the use of AI scribe software for clinical note transcription with the patient, who gave verbal consent to proceed.  History of Present Illness   ERCEL PEPITONE is a 53 year old male who presents for a follow-up regarding his new medication, Pristiq .  He started Pristiq  and notes an improvement in his condition with no significant side effects except mild sleepiness, which he manages by avoiding nighttime doses. The medication's effects coincided with reduced stress from securing a new job. He finds Pristiq  more effective than his previous medication, Buspar , and has been on it for approximately six weeks, currently taking 50 mg daily.  He discusses his blood pressure medication, mentioning a previous issue with a delayed refill but confirms he has recently refilled his prescriptions. He is on a 90-day supply for one of his blood pressure medications and confirms that his prescriptions are filled at [facility].  He reports that he has been taking hydrocodone  5 mg for several years and is starting to notice that it does not work as well as it used to. He manages his pain with hydrocodone  and BC powders. He also reports using cannabis while in [facility], which he found helpful, but notes it is not legal in his home state. He does not consume alcohol and prefers not to use vapes, opting for gums instead.  Socially, he is starting a new job with a company he worked for 15 years ago, which has reduced his stress levels.       Medications: Outpatient Medications Prior to Visit  Medication Sig   amLODipine  (NORVASC ) 5 MG tablet TAKE 1 TABLET(5 MG) BY  MOUTH DAILY   benazepril -hydrochlorthiazide (LOTENSIN  HCT) 20-12.5 MG tablet TAKE 1 TABLET BY MOUTH DAILY   desvenlafaxine  (PRISTIQ ) 50 MG 24 hr tablet Take 1 tablet (50 mg total) by mouth daily.   famotidine  (PEPCID ) 20 MG tablet Take 1 tablet (20 mg total) by mouth 2 (two) times daily.   HYDROcodone -acetaminophen  (NORCO/VICODIN) 5-325 MG tablet Take 1 tablet by mouth every 4 (four) hours as needed for moderate pain (pain score 4-6).   hyoscyamine  (LEVSIN  SL) 0.125 MG SL tablet DISSOLVE 1 TABLET(0.125 MG) UNDER THE TONGUE EVERY 4 HOURS AS NEEDED   ibuprofen (ADVIL,MOTRIN) 200 MG tablet Take 400-600 mg by mouth 2 (two) times daily as needed for headache or mild pain.    meloxicam  (MOBIC ) 15 MG tablet TAKE 1 TABLET BY MOUTH EVERY DAY AS NEEDED FOR PAIN, REPLACES NABUMETONE    No facility-administered medications prior to visit.        Objective    BP (!) 120/90 (BP Location: Left Arm, Patient Position: Sitting, Cuff Size: Normal)   Pulse 99   Wt 237 lb 12.8 oz (107.9 kg)   SpO2 100%   BMI 34.12 kg/m   Physical Exam   General appearance: Mildly obese male, cooperative and in no acute distress Head: Normocephalic, without obvious abnormality, atraumatic Respiratory: Respirations even and unlabored, normal respiratory rate Extremities: All extremities are intact.  Skin: Skin color, texture, turgor normal. No rashes seen  Psych: Appropriate mood and affect. Neurologic: Mental status:  Alert, oriented to person, place, and time, thought content appropriate.     Assessment & Plan     1. Situational stress (Primary) Doing well on current dose of Pritiq. Refill 90 days supply next month.   2. Displacement of cervical intervertebral disc without myelopathy Has been managed on 5/325 for many years but is no longer as effective. Will increase to HYDROcodone -acetaminophen  (NORCO) 7.5-325 MG tablet; Take 1 tablet by mouth every 6 (six) hours as needed for up to 5 days for moderate pain (pain  score 4-6).  Dispense: 20 tablet; Refill: 0  3. Chronic low back pain without sciatica, unspecified back pain laterality    Return in about 3 months (around 03/20/2024) for Yearly Physical.     Nancyann Perry, MD  [facility] [phone] (phone) [phone] (fax)  [facility]

## 2023-12-19 NOTE — Patient Instructions (Signed)
 SABRA  Please review the attached list of medications and notify my office if there are any errors.   . Please bring all of your medications to every appointment so we can make sure that our medication list is the same as yours.

## 2023-12-25 ENCOUNTER — Other Ambulatory Visit: Payer: Self-pay | Admitting: Family Medicine

## 2023-12-25 DIAGNOSIS — M5020 Other cervical disc displacement, unspecified cervical region: Secondary | ICD-10-CM

## 2023-12-25 DIAGNOSIS — M545 Low back pain, unspecified: Secondary | ICD-10-CM

## 2023-12-25 NOTE — Telephone Encounter (Unsigned)
 Copied from CRM #[id number]. Topic: Clinical - Medication Refill >> Dec 25, 2023 11:56 AM Everette C wrote: Medication: HYDROcodone -acetaminophen  (NORCO/VICODIN) 5-325 MG tablet   Has the patient contacted their pharmacy? No (Agent: If no, request that the patient contact the pharmacy for the refill. If patient does not wish to contact the pharmacy document the reason why and proceed with request.) (Agent: If yes, when and what did the pharmacy advise?)  This is the patient's preferred pharmacy:  [facility][id number] [place], [place] - [address] AT [facility] OF [address] [place] [address] [place] [address] Phone: [phone] Fax: [phone]  Is this the correct pharmacy for this prescription? Yes If no, delete pharmacy and type the correct one.   Has the prescription been filled recently? Yes  Is the patient out of the medication? Yes  Has the patient been seen for an appointment in the last year OR does the patient have an upcoming appointment? Yes  Can we respond through MyChart? No  Agent: Please be advised that Rx refills may take up to 3 business days. We ask that you follow-up with your pharmacy.

## 2023-12-27 MED ORDER — HYDROCODONE-ACETAMINOPHEN 5-325 MG PO TABS: 1.0000 | ORAL_TABLET | ORAL | 0 refills | Status: DC | PRN

## 2023-12-27 NOTE — Telephone Encounter (Signed)
 Copied from CRM #[id number]. Topic: Clinical - Medication Refill >> Dec 27, 2023 11:56 AM Emylou G wrote: Patient called.. checking status of refill.. he is out.. I adv of turn time

## 2024-01-09 ENCOUNTER — Other Ambulatory Visit: Payer: Self-pay | Admitting: Family Medicine

## 2024-01-09 MED ORDER — DESVENLAFAXINE SUCCINATE ER 50 MG PO TB24: 50.0000 mg | ORAL_TABLET | Freq: Every day | ORAL | 1 refills | Status: AC

## 2024-01-18 ENCOUNTER — Other Ambulatory Visit: Payer: Self-pay | Admitting: Family Medicine

## 2024-01-18 DIAGNOSIS — M5020 Other cervical disc displacement, unspecified cervical region: Secondary | ICD-10-CM

## 2024-01-18 DIAGNOSIS — G8929 Other chronic pain: Secondary | ICD-10-CM

## 2024-01-18 NOTE — Telephone Encounter (Signed)
 Copied from CRM #[id number]. Topic: Clinical - Medication Refill >> Jan 18, 2024 11:57 AM Victoria B wrote: Medication: HYDROcodone -acetaminophen  (NORCO/VICODIN) 5-325 MG tablet  Has the patient contacted their pharmacy? no  This is the patient's preferred pharmacy:  [facility] #[id number] - [place], [place] - [address] AT [facility] [address] [place] [address] [place] [address] Phone: [phone] Fax: [phone]  Is this the correct pharmacy for this prescription? yes    Has the prescription been filled recently? no  Is the patient out of the medication? yes  Has the patient been seen for an appointment in the last year OR does the patient have an upcoming appointment? yes  Can we respond through MyChart? yes  Agent: Please be advised that Rx refills may take up to 3 business days. We ask that you follow-up with your pharmacy.

## 2024-01-21 NOTE — Telephone Encounter (Unsigned)
 Copied from CRM #[id number]. Topic: Clinical - Medication Refill >> Jan 18, 2024 11:57 AM Victoria B wrote: Medication: HYDROcodone -acetaminophen  (NORCO/VICODIN) 5-325 MG tablet  Has the patient contacted their pharmacy? no  This is the patient's preferred pharmacy:  [facility] #[id number] - [place], [place] - [address] AT [facility] OF [address] [place] [address] [place] [address] Phone: [phone] Fax: [phone]  Is this the correct pharmacy for this prescription? yes    Has the prescription been filled recently? no  Is the patient out of the medication? yes  Has the patient been seen for an appointment in the last year OR does the patient have an upcoming appointment? yes  Can we respond through MyChart? yes  Agent: Please be advised that Rx refills may take up to 3 business days. We ask that you follow-up with your pharmacy. >> Jan 21, 2024 11:44 AM Hadassah PARAS wrote: Pt is calling to advise that he is out of medication and would like this req to be expedited

## 2024-01-21 NOTE — Telephone Encounter (Signed)
 Requested medication (s) are due for refill today: Yes  Requested medication (s) are on the active medication list: Yes  Last refill:  12/27/23  Future visit scheduled: Yes  Notes to clinic:  Unable to refill per protocol, cannot delegate.      Requested Prescriptions  Pending Prescriptions Disp Refills   HYDROcodone -acetaminophen  (NORCO/VICODIN) 5-325 MG tablet 150 tablet 0    Sig: Take 1 tablet by mouth every 4 (four) hours as needed for moderate pain (pain score 4-6).     Not Delegated - Analgesics:  Opioid Agonist Combinations Failed - 01/21/2024  2:31 PM      Failed - This refill cannot be delegated      Failed - Urine Drug Screen completed in last 360 days      Passed - Valid encounter within last 3 months    Recent Outpatient Visits           1 month ago Situational stress   [facility] Nancyann BRAVO, MD   2 months ago Situational stress   [facility] Gas[facility] Nancyann BRAVO, MD   7 months ago Aneurysm of ascending aorta without rupture   [facility] Nancyann BRAVO, MD   10 months ago Episodic mood disorder   [facility] Gasper Nancyann BRAVO, MD

## 2024-01-22 MED ORDER — HYDROCODONE-ACETAMINOPHEN 5-325 MG PO TABS: 1.0000 | ORAL_TABLET | ORAL | 0 refills | Status: DC | PRN

## 2024-02-06 ENCOUNTER — Other Ambulatory Visit: Payer: Self-pay | Admitting: Family Medicine

## 2024-02-06 DIAGNOSIS — E782 Mixed hyperlipidemia: Secondary | ICD-10-CM

## 2024-02-12 ENCOUNTER — Other Ambulatory Visit: Payer: Self-pay | Admitting: Family Medicine

## 2024-02-12 DIAGNOSIS — M545 Low back pain, unspecified: Secondary | ICD-10-CM

## 2024-02-12 DIAGNOSIS — M5020 Other cervical disc displacement, unspecified cervical region: Secondary | ICD-10-CM

## 2024-02-12 NOTE — Telephone Encounter (Signed)
 Requested medications are due for refill today.  A little too soon  Requested medications are on the active medications list.  yes  Last refill. 01/22/2024 #150 0 rf  Future visit scheduled.   yes  Notes to clinic.  Refill not delegated.    Requested Prescriptions  Pending Prescriptions Disp Refills   HYDROcodone -acetaminophen  (NORCO/VICODIN) 5-325 MG tablet 150 tablet 0    Sig: Take 1 tablet by mouth every 4 (four) hours as needed for moderate pain (pain score 4-6).     Not Delegated - Analgesics:  Opioid Agonist Combinations Failed - 02/12/2024  5:47 PM      Failed - This refill cannot be delegated      Failed - Urine Drug Screen completed in last 360 days      Passed - Valid encounter within last 3 months    Recent Outpatient Visits           1 month ago Situational stress   [facility] Gasper Nancyann BRAVO, MD   3 months ago Situational stress   [facility] Gasper Nancyann BRAVO, MD   8 months ago Aneurysm of ascending aorta without rupture   [facility] Nancyann BRAVO, MD   11 months ago Episodic mood disorder   [facility] Gas[facility] Nancyann BRAVO, MD

## 2024-02-12 NOTE — Telephone Encounter (Unsigned)
 Copied from CRM [id number]. Topic: Clinical - Medication Refill >> Feb 12, 2024 12:21 PM Geneva B wrote: Medication: HYDROcodone -acetaminophen  (  Has the patient contacted their pharmacy? Yes (Agent: If no, request that the patient contact the pharmacy for the refill. If patient does not wish to contact the pharmacy document the reason why and proceed with request.) (Agent: If yes, when and what did the pharmacy advise?)  This is the patient's preferred pharmacy:  [facility] #[id number] [place], [place] - [address] AT [facility] OF [address] [place] [address] [place] [address] Phone: [phone] Fax: [phone]  Is this the correct pharmacy for this prescription? Yes If no, delete pharmacy and type the correct one.   Has the prescription been filled recently? No  Is the patient out of the medication? No   Has the patient been seen for an appointment in the last year OR does the patient have an upcoming appointment? Yes  Can we respond through MyChart? No  Agent: Please be advised that Rx refills may take up to 3 business days. We ask that you follow-up with your pharmacy.

## 2024-02-13 MED ORDER — HYDROCODONE-ACETAMINOPHEN 5-325 MG PO TABS: 1.0000 | ORAL_TABLET | ORAL | 0 refills | Status: AC | PRN

## 2024-03-21 ENCOUNTER — Encounter: Payer: Self-pay | Admitting: Family Medicine

## 2024-07-11 ENCOUNTER — Ambulatory Visit (INDEPENDENT_AMBULATORY_CARE_PROVIDER_SITE_OTHER): Admitting: Nurse Practitioner
